# Patient Record
Sex: Female | Born: 1970 | Race: Black or African American | Hispanic: No | Smoking: Former smoker
Health system: Southern US, Community
[De-identification: ages and names within clinical notes are randomized; demographics above are authoritative.]

## PROBLEM LIST (undated history)

## (undated) DIAGNOSIS — K8000 Calculus of gallbladder with acute cholecystitis without obstruction: Secondary | ICD-10-CM

## (undated) DIAGNOSIS — I10 Essential (primary) hypertension: Secondary | ICD-10-CM

## (undated) DIAGNOSIS — N83209 Unspecified ovarian cyst, unspecified side: Secondary | ICD-10-CM

## (undated) DIAGNOSIS — F410 Panic disorder [episodic paroxysmal anxiety] without agoraphobia: Secondary | ICD-10-CM

## (undated) DIAGNOSIS — K219 Gastro-esophageal reflux disease without esophagitis: Secondary | ICD-10-CM

## (undated) DIAGNOSIS — H269 Unspecified cataract: Secondary | ICD-10-CM

## (undated) DIAGNOSIS — F419 Anxiety disorder, unspecified: Secondary | ICD-10-CM

## (undated) DIAGNOSIS — E785 Hyperlipidemia, unspecified: Secondary | ICD-10-CM

## (undated) DIAGNOSIS — K625 Hemorrhage of anus and rectum: Secondary | ICD-10-CM

## (undated) DIAGNOSIS — R569 Unspecified convulsions: Secondary | ICD-10-CM

## (undated) DIAGNOSIS — E669 Obesity, unspecified: Secondary | ICD-10-CM

## (undated) DIAGNOSIS — F319 Bipolar disorder, unspecified: Secondary | ICD-10-CM

## (undated) DIAGNOSIS — G5600 Carpal tunnel syndrome, unspecified upper limb: Secondary | ICD-10-CM

## (undated) HISTORY — DX: Hyperlipidemia, unspecified: E78.5

## (undated) HISTORY — DX: Essential (primary) hypertension: I10

## (undated) HISTORY — PX: ECTOPIC PREGNANCY SURGERY: SHX613

## (undated) HISTORY — DX: Unspecified cataract: H26.9

## (undated) HISTORY — PX: ANKLE FRACTURE SURGERY: SHX122

---

## 1992-02-28 DIAGNOSIS — K219 Gastro-esophageal reflux disease without esophagitis: Secondary | ICD-10-CM

## 1992-02-28 HISTORY — DX: Gastro-esophageal reflux disease without esophagitis: K21.9

## 2013-03-21 ENCOUNTER — Encounter (HOSPITAL_COMMUNITY): Payer: Self-pay | Admitting: *Deleted

## 2013-03-21 ENCOUNTER — Inpatient Hospital Stay (HOSPITAL_COMMUNITY)
Admission: AD | Admit: 2013-03-21 | Discharge: 2013-03-21 | Disposition: A | Discharge status: Home / Self Care | Payer: Medicaid Other | Source: Ambulatory Visit | Attending: Obstetrics and Gynecology | Admitting: Obstetrics and Gynecology

## 2013-03-21 DIAGNOSIS — R1013 Epigastric pain: Secondary | ICD-10-CM | POA: Insufficient documentation

## 2013-03-21 DIAGNOSIS — R12 Heartburn: Secondary | ICD-10-CM | POA: Insufficient documentation

## 2013-03-21 DIAGNOSIS — K219 Gastro-esophageal reflux disease without esophagitis: Secondary | ICD-10-CM

## 2013-03-21 DIAGNOSIS — Z87891 Personal history of nicotine dependence: Secondary | ICD-10-CM | POA: Insufficient documentation

## 2013-03-21 HISTORY — DX: Hemorrhage of anus and rectum: K62.5

## 2013-03-21 HISTORY — DX: Panic disorder (episodic paroxysmal anxiety): F41.0

## 2013-03-21 HISTORY — DX: Unspecified ovarian cyst, unspecified side: N83.209

## 2013-03-21 HISTORY — DX: Obesity, unspecified: E66.9

## 2013-03-21 HISTORY — DX: Unspecified convulsions: R56.9

## 2013-03-21 HISTORY — DX: Anxiety disorder, unspecified: F41.9

## 2013-03-21 HISTORY — DX: Carpal tunnel syndrome, unspecified upper limb: G56.00

## 2013-03-21 HISTORY — DX: Gastro-esophageal reflux disease without esophagitis: K21.9

## 2013-03-21 HISTORY — DX: Bipolar disorder, unspecified: F31.9

## 2013-03-21 LAB — COMPREHENSIVE METABOLIC PANEL
ALK PHOS: 84 U/L (ref 39–117)
ALT: 23 U/L (ref 0–35)
AST: 18 U/L (ref 0–37)
Albumin: 3.8 g/dL (ref 3.5–5.2)
BUN: 6 mg/dL (ref 6–23)
CO2: 24 meq/L (ref 19–32)
Calcium: 9.4 mg/dL (ref 8.4–10.5)
Chloride: 102 mEq/L (ref 96–112)
Creatinine, Ser: 0.83 mg/dL (ref 0.50–1.10)
GFR calc Af Amer: >90 mL/min (ref >90)
GFR, EST NON AFRICAN AMERICAN: 87 mL/min — AB (ref >90)
GLUCOSE: 90 mg/dL (ref 70–99)
POTASSIUM: 4 meq/L (ref 3.7–5.3)
Sodium: 139 mEq/L (ref 137–147)
TOTAL PROTEIN: 7.5 g/dL (ref 6.0–8.3)
Total Bilirubin: 0.3 mg/dL (ref 0.3–1.2)

## 2013-03-21 LAB — CBC
HEMATOCRIT: 42 % (ref 36.0–46.0)
HEMOGLOBIN: 14.3 g/dL (ref 12.0–15.0)
MCH: 31.4 pg (ref 26.0–34.0)
MCHC: 34 g/dL (ref 30.0–36.0)
MCV: 92.1 fL (ref 78.0–100.0)
Platelets: 371 10*3/uL (ref 150–400)
RBC: 4.56 MIL/uL (ref 3.87–5.11)
RDW: 13.6 % (ref 11.5–15.5)
WBC: 7.9 10*3/uL (ref 4.0–10.5)

## 2013-03-21 LAB — LIPASE, BLOOD: Lipase: 28 U/L (ref 11–59)

## 2013-03-21 LAB — AMYLASE: AMYLASE: 67 U/L (ref 0–105)

## 2013-03-21 MED ORDER — SUCRALFATE 1 GM/10ML PO SUSP: 1.0000 g | Freq: Three times a day (TID) | ORAL | Status: DC

## 2013-03-21 MED ORDER — ESOMEPRAZOLE MAGNESIUM 20 MG PO PACK: 20.0000 mg | PACK | Freq: Every day | ORAL | Status: DC

## 2013-03-21 MED ORDER — GI COCKTAIL ~~LOC~~
30.0000 mL | Freq: Once | ORAL | Status: AC
  Administered 2013-03-21: 30 mL via ORAL
  Filled 2013-03-21: qty 30

## 2013-03-21 NOTE — MAU Note (Signed)
Been having muscle spasms in abd and heartburn. At night makes her short of breath.   Has been having n/v.  Pain is getting worse.  Was on meds when lived in [place], has been off since November.

## 2013-03-21 NOTE — MAU Provider Note (Signed)
CC: Abdominal Pain and Heartburn    First Provider Initiated Contact with Patient 03/21/13 1857      HPI Kathryn Larson is a 43 y.o. [id number] presenting with heartburn and epigastric abdominal pain radiating bilaterally to costal margins and mid back. Describes the abd pain as muscle spasms. Has to sleep semi-upright to alleviate heartburn. She describes burning sensation at times causing nausea and vomiting. Has tried vinegar water, baking soda, Prilosec without relief.Intolerant of spicy, greasy foods. Symptoms typical of GERD for which she was given Nexium from [place] who [place] Denies hx of GB disease. No fever, chills, weight loss.  Needs GYN provider  Past Medical History  Diagnosis Date  . GERD (gastroesophageal reflux disease)   . Bipolar affective   . Rectal bleed   . Anxiety   . Obesity   . Panic attacks   . Seizures   . Carpal tunnel syndrome   . Ovarian cyst     OB History  Gravida Para Term Preterm AB SAB TAB Ectopic Multiple Living  6 2 2  4 4    2     # Outcome Date GA Lbr Len/2nd Weight Sex Delivery Anes PTL Lv  6 SAB           5 SAB           4 SAB           3 SAB           2 TRM           1 TRM               Past Surgical History  Procedure Laterality Date  . Ankle fracture surgery      Bilateral  . Ectopic pregnancy surgery      History   Social History  . Marital Status: Legally Separated    Spouse Name: N/A    Number of Children: N/A  . Years of Education: N/A   Occupational History  . Not on file.   Social History Main Topics  . Smoking status: Former Smoker    Quit date: 02/27/2010  . Smokeless tobacco: Not on file  . Alcohol Use: No  . Drug Use: No  . Sexual Activity: Not on file   Other Topics Concern  . Not on file   Social History Narrative  . No narrative on file    No current facility-administered medications on file prior to encounter.   No current outpatient prescriptions on file prior to encounter.     Not on File  ROS Pertinent items in HPI  PHYSICAL EXAM Filed Vitals:   03/21/13 1743  BP: 131/76  Pulse: 72  Temp: 98.3 F (36.8 C)  Resp: 20   General: Obese female in no acute distress Cardiovascular: Normal rate Respiratory: Normal effort Abdomen: Soft, obese. Mild to moderate TTP epigastrum and RUQ, LUQ. Neg Murphy's sign. No rebound. No lower abd pain Back: No CVAT Extremities: No edema Neurologic: Alert and oriented LAB RESULTS Results for orders placed during the hospital encounter of 03/21/13 (from the past 24 hour(s))  CBC     Status: None   Collection Time    03/21/13  7:30 PM      Result Value Range   WBC 7.9  4.0 - 10.5 K/uL   RBC 4.56  3.87 - 5.11 MIL/uL   Hemoglobin 14.3  12.0 - 15.0 g/dL   HCT 42.0  36.0 - 46.0 %  MCV 92.1  78.0 - 100.0 fL   MCH 31.4  26.0 - 34.0 pg   MCHC 34.0  30.0 - 36.0 g/dL   RDW 13.6  11.5 - 15.5 %   Platelets 371  150 - 400 K/uL    IMAGING No results found.  MAU COURSE GI cocktail given at [place] assumed by [name] MD at 2000. (Labs pending)  ASSESSMENT  1. GERD (gastroesophageal reflux disease)     PLAN Discharge home. See AVS for patient education.      [name], CNM 03/21/2013 7:04 PM  Pt feeling much better after GI cocktail. rx for nexium and carafate. Discussed need to have doctor to f/u with in the long term.   [name], [name], MD

## 2013-03-21 NOTE — Discharge Instructions (Signed)
Diet for Gastroesophageal Reflux Disease, Adult Reflux (acid reflux) is when acid from your stomach flows up into the esophagus. When acid comes in contact with the esophagus, the acid causes irritation and soreness (inflammation) in the esophagus. When reflux happens often or so severely that it causes damage to the esophagus, it is called gastroesophageal reflux disease (GERD). Nutrition therapy can help ease the discomfort of GERD. FOODS OR DRINKS TO AVOID OR LIMIT  Smoking or chewing tobacco. Nicotine is one of the most potent stimulants to acid production in the gastrointestinal tract.  Caffeinated and decaffeinated coffee and black tea.  Regular or low-calorie carbonated beverages or energy drinks (caffeine-free carbonated beverages are allowed).   Strong spices, such as black pepper, white pepper, red pepper, cayenne, curry powder, and chili powder.  Peppermint or spearmint.  Chocolate.  High-fat foods, including meats and fried foods. Extra added fats including oils, butter, salad dressings, and nuts. Limit these to less than 8 tsp per day.  Fruits and vegetables if they are not tolerated, such as citrus fruits or tomatoes.  Alcohol.  Any food that seems to aggravate your condition. If you have questions regarding your diet, call your caregiver or a registered dietitian. OTHER THINGS THAT MAY HELP GERD INCLUDE:   Eating your meals slowly, in a relaxed setting.  Eating 5 to 6 small meals per day instead of 3 large meals.  Eliminating food for a period of time if it causes distress.  Not lying down until 3 hours after eating a meal.  Keeping the head of your bed raised 6 to 9 inches (15 to 23 cm) by using a foam wedge or blocks under the legs of the bed. Lying flat may make symptoms worse.  Being physically active. Weight loss may be helpful in reducing reflux in overweight or obese adults.  Wear loose fitting clothing EXAMPLE MEAL PLAN This meal plan is approximately  2,000 calories based on [email or web address] meal planning guidelines. Breakfast   cup cooked oatmeal.  1 cup strawberries.  1 cup low-fat milk.  1 oz almonds. Snack  1 cup cucumber slices.  6 oz yogurt (made from low-fat or fat-free milk). Lunch  2 slice whole-wheat bread.  2 oz sliced [place].  2 tsp mayonnaise.  1 cup blueberries.  1 cup snap peas. Snack  6 whole-wheat crackers.  1 oz string cheese. Dinner   cup brown rice.  1 cup mixed veggies.  1 tsp olive oil.  3 oz grilled fish. Document Released: 02/13/2005 Document Revised: 05/08/2011 Document Reviewed: 12/30/2010 [facility] Patient Information 2014 [place], [place].

## 2013-03-23 ENCOUNTER — Encounter (HOSPITAL_COMMUNITY): Payer: Self-pay | Admitting: Emergency Medicine

## 2013-03-23 ENCOUNTER — Observation Stay (HOSPITAL_COMMUNITY)
Admission: EM | Admit: 2013-03-23 | Discharge: 2013-03-26 | Disposition: A | Discharge status: Home / Self Care | Payer: Medicaid Other | Attending: General Surgery | Admitting: General Surgery

## 2013-03-23 ENCOUNTER — Emergency Department (HOSPITAL_COMMUNITY): Payer: Medicaid Other

## 2013-03-23 DIAGNOSIS — K8010 Calculus of gallbladder with chronic cholecystitis without obstruction: Principal | ICD-10-CM | POA: Insufficient documentation

## 2013-03-23 DIAGNOSIS — E66813 Obesity, class 3: Secondary | ICD-10-CM | POA: Diagnosis present

## 2013-03-23 DIAGNOSIS — G5600 Carpal tunnel syndrome, unspecified upper limb: Secondary | ICD-10-CM | POA: Insufficient documentation

## 2013-03-23 DIAGNOSIS — F319 Bipolar disorder, unspecified: Secondary | ICD-10-CM | POA: Diagnosis present

## 2013-03-23 DIAGNOSIS — F410 Panic disorder [episodic paroxysmal anxiety] without agoraphobia: Secondary | ICD-10-CM | POA: Insufficient documentation

## 2013-03-23 DIAGNOSIS — K219 Gastro-esophageal reflux disease without esophagitis: Secondary | ICD-10-CM | POA: Diagnosis present

## 2013-03-23 DIAGNOSIS — E669 Obesity, unspecified: Secondary | ICD-10-CM | POA: Insufficient documentation

## 2013-03-23 DIAGNOSIS — F411 Generalized anxiety disorder: Secondary | ICD-10-CM | POA: Insufficient documentation

## 2013-03-23 DIAGNOSIS — F17200 Nicotine dependence, unspecified, uncomplicated: Secondary | ICD-10-CM | POA: Insufficient documentation

## 2013-03-23 DIAGNOSIS — R109 Unspecified abdominal pain: Secondary | ICD-10-CM

## 2013-03-23 DIAGNOSIS — K8000 Calculus of gallbladder with acute cholecystitis without obstruction: Secondary | ICD-10-CM | POA: Diagnosis present

## 2013-03-23 DIAGNOSIS — K810 Acute cholecystitis: Secondary | ICD-10-CM

## 2013-03-23 HISTORY — DX: Morbid (severe) obesity due to excess calories: E66.01

## 2013-03-23 HISTORY — DX: Bipolar disorder, unspecified: F31.9

## 2013-03-23 HISTORY — DX: Calculus of gallbladder with acute cholecystitis without obstruction: K80.00

## 2013-03-23 LAB — URINALYSIS, ROUTINE W REFLEX MICROSCOPIC
BILIRUBIN URINE: NEGATIVE
Glucose, UA: NEGATIVE mg/dL
Ketones, ur: NEGATIVE mg/dL
Leukocytes, UA: NEGATIVE
Nitrite: NEGATIVE
PH: 7 (ref 5.0–8.0)
Protein, ur: NEGATIVE mg/dL
SPECIFIC GRAVITY, URINE: 1.019 (ref 1.005–1.030)
UROBILINOGEN UA: 0.2 mg/dL (ref 0.0–1.0)

## 2013-03-23 LAB — COMPREHENSIVE METABOLIC PANEL
ALBUMIN: 3.6 g/dL (ref 3.5–5.2)
ALK PHOS: 88 U/L (ref 39–117)
ALT: 19 U/L (ref 0–35)
AST: 18 U/L (ref 0–37)
BUN: 7 mg/dL (ref 6–23)
CALCIUM: 8.7 mg/dL (ref 8.4–10.5)
CO2: 22 mEq/L (ref 19–32)
Chloride: 104 mEq/L (ref 96–112)
Creatinine, Ser: 0.83 mg/dL (ref 0.50–1.10)
GFR calc non Af Amer: 87 mL/min — ABNORMAL LOW (ref >90)
GLUCOSE: 105 mg/dL — AB (ref 70–99)
Potassium: 3.4 mEq/L — ABNORMAL LOW (ref 3.7–5.3)
SODIUM: 139 meq/L (ref 137–147)
TOTAL PROTEIN: 7.2 g/dL (ref 6.0–8.3)
Total Bilirubin: 0.4 mg/dL (ref 0.3–1.2)

## 2013-03-23 LAB — URINE MICROSCOPIC-ADD ON

## 2013-03-23 LAB — CBC WITH DIFFERENTIAL/PLATELET
Basophils Absolute: 0 10*3/uL (ref 0.0–0.1)
Basophils Relative: 0 % (ref 0–1)
EOS ABS: 0.4 10*3/uL (ref 0.0–0.7)
Eosinophils Relative: 7 % — ABNORMAL HIGH (ref 0–5)
HCT: 40 % (ref 36.0–46.0)
Hemoglobin: 13.9 g/dL (ref 12.0–15.0)
LYMPHS ABS: 2 10*3/uL (ref 0.7–4.0)
Lymphocytes Relative: 31 % (ref 12–46)
MCH: 31.9 pg (ref 26.0–34.0)
MCHC: 34.8 g/dL (ref 30.0–36.0)
MCV: 91.7 fL (ref 78.0–100.0)
Monocytes Absolute: 0.7 10*3/uL (ref 0.1–1.0)
Monocytes Relative: 10 % (ref 3–12)
Neutro Abs: 3.4 10*3/uL (ref 1.7–7.7)
Neutrophils Relative %: 52 % (ref 43–77)
PLATELETS: 358 10*3/uL (ref 150–400)
RBC: 4.36 MIL/uL (ref 3.87–5.11)
RDW: 12.9 % (ref 11.5–15.5)
WBC: 6.5 10*3/uL (ref 4.0–10.5)

## 2013-03-23 LAB — LIPASE, BLOOD: Lipase: 35 U/L (ref 11–59)

## 2013-03-23 LAB — POCT PREGNANCY, URINE: PREG TEST UR: NEGATIVE

## 2013-03-23 MED ORDER — MORPHINE SULFATE 4 MG/ML IJ SOLN
6.0000 mg | Freq: Once | INTRAMUSCULAR | Status: AC
  Administered 2013-03-23: 6 mg via INTRAVENOUS
  Filled 2013-03-23: qty 2

## 2013-03-23 MED ORDER — PANTOPRAZOLE SODIUM 40 MG IV SOLR
40.0000 mg | Freq: Every day | INTRAVENOUS | Status: DC
  Administered 2013-03-23: 40 mg via INTRAVENOUS
  Filled 2013-03-23 (×2): qty 40

## 2013-03-23 MED ORDER — HYDROMORPHONE HCL PF 1 MG/ML IJ SOLN
1.0000 mg | Freq: Once | INTRAMUSCULAR | Status: AC
  Administered 2013-03-23: 1 mg via INTRAVENOUS
  Filled 2013-03-23: qty 1

## 2013-03-23 MED ORDER — FAMOTIDINE IN NACL 20-0.9 MG/50ML-% IV SOLN
20.0000 mg | Freq: Once | INTRAVENOUS | Status: AC
  Administered 2013-03-23: 20 mg via INTRAVENOUS
  Filled 2013-03-23: qty 50

## 2013-03-23 MED ORDER — PIPERACILLIN-TAZOBACTAM 3.375 G IVPB
3.3750 g | Freq: Three times a day (TID) | INTRAVENOUS | Status: DC
  Administered 2013-03-23 – 2013-03-24 (×3): 3.375 g via INTRAVENOUS
  Filled 2013-03-23 (×5): qty 50

## 2013-03-23 MED ORDER — ONDANSETRON HCL 4 MG/2ML IJ SOLN
4.0000 mg | Freq: Once | INTRAMUSCULAR | Status: AC
  Administered 2013-03-23: 4 mg via INTRAVENOUS
  Filled 2013-03-23: qty 2

## 2013-03-23 MED ORDER — DEXTROSE-NACL 5-0.9 % IV SOLN
INTRAVENOUS | Status: DC
  Administered 2013-03-23: 22:00:00 via INTRAVENOUS
  Administered 2013-03-24: 125 mL/h via INTRAVENOUS
  Administered 2013-03-24: 16:00:00 via INTRAVENOUS

## 2013-03-23 MED ORDER — IOHEXOL 300 MG/ML  SOLN
100.0000 mL | Freq: Once | INTRAMUSCULAR | Status: AC | PRN
  Administered 2013-03-23: 100 mL via INTRAVENOUS

## 2013-03-23 MED ORDER — HYDROMORPHONE HCL PF 1 MG/ML IJ SOLN
1.0000 mg | INTRAMUSCULAR | Status: DC | PRN
  Administered 2013-03-23 – 2013-03-24 (×8): 1 mg via INTRAVENOUS
  Filled 2013-03-23 (×9): qty 1

## 2013-03-23 MED ORDER — ONDANSETRON HCL 4 MG/2ML IJ SOLN
4.0000 mg | Freq: Four times a day (QID) | INTRAMUSCULAR | Status: DC | PRN
  Administered 2013-03-23 – 2013-03-24 (×3): 4 mg via INTRAVENOUS
  Filled 2013-03-23 (×3): qty 2

## 2013-03-23 MED ORDER — HYDROMORPHONE HCL PF 1 MG/ML IJ SOLN
1.0000 mg | INTRAMUSCULAR | Status: DC | PRN
  Administered 2013-03-23 – 2013-03-26 (×4): 1 mg via INTRAVENOUS
  Filled 2013-03-23 (×3): qty 1

## 2013-03-23 MED ORDER — SERTRALINE HCL 100 MG PO TABS
100.0000 mg | ORAL_TABLET | Freq: Three times a day (TID) | ORAL | Status: DC
  Administered 2013-03-23 – 2013-03-26 (×6): 100 mg via ORAL
  Filled 2013-03-23 (×11): qty 1

## 2013-03-23 MED ORDER — IOHEXOL 300 MG/ML  SOLN
25.0000 mL | Freq: Once | INTRAMUSCULAR | Status: AC | PRN
  Administered 2013-03-23: 25 mL via ORAL

## 2013-03-23 MED ORDER — SUCRALFATE 1 GM/10ML PO SUSP
1.0000 g | Freq: Three times a day (TID) | ORAL | Status: DC
  Administered 2013-03-23 – 2013-03-26 (×7): 1 g via ORAL
  Filled 2013-03-23 (×17): qty 10

## 2013-03-23 NOTE — ED Notes (Signed)
PA at bedside.

## 2013-03-23 NOTE — ED Provider Notes (Signed)
One week intermittent right upper quadrant and epigastric abdominal pain lasting several hours at a time current episode is an approximately 24 hours with multiple spells of nausea and vomiting with moderate right upper quadrant tenderness without rebound with labs unremarkable however ultrasound with cholelithiasis with dilated common bile duct and surgery paged for evaluation.  Medical screening examination/treatment/procedure(s) were conducted as a shared visit with non-physician practitioner(s) and myself.  I personally evaluated the patient during the encounter.  EKG Interpretation    Date/Time:    Ventricular Rate:    PR Interval:    QRS Duration:   QT Interval:    QTC Calculation:   R Axis:     Text Interpretation:               Babette Relic, MD 03/23/13 2041

## 2013-03-23 NOTE — ED Notes (Signed)
Per Dr. Canary Brim, patient to no longer receive HIDA scan, patient informed, pain medication order recieved

## 2013-03-23 NOTE — ED Notes (Signed)
Pt returns from ultrasound

## 2013-03-23 NOTE — ED Notes (Signed)
Per EMS pt has hx of acid reflux and ulcers. Pt c/o stabbing pain which she states feels the same as her acid reflux pain. Denies SOB  Or LOC. Pt states she has some n/v. Pain is 10/10 Pt is ambulatory.

## 2013-03-23 NOTE — ED Provider Notes (Signed)
CSN: [id number]     Arrival date & time 03/23/13  6945 History   First MD Initiated Contact with Patient 03/23/13 [phone]     Chief Complaint  Patient presents with  . Abdominal Pain   (Consider location/radiation/quality/duration/timing/severity/associated sxs/prior Treatment) HPI Comments: Patient with h/o GERD, tubal pregnancy, no other abdominal surgeries -- presents with sharp upper abdominal pain for the past one week with associated vomiting. Patient was seen at MAU on 1/23 and prescribed Carafate which has not been helping. Patient feels that this pain is different than her typical GERD pain. Normally patient will have burning sensation in epigastrium and mid chest relieved with OTC meds. This new pain radiates to the back and shoulders. It is not associated with fever, diarrhea, blood in stool, urinary changes. Patient is currently on her menstrual period which has been normal for her. The onset of this condition was acute. The course is waxing and waning. Aggravating factors: none. Alleviating factors: none.    Patient is a 43 y.o. female presenting with abdominal pain. The history is provided by the patient.  Abdominal Pain Associated symptoms: nausea and vomiting   Associated symptoms: no chest pain, no cough, no diarrhea, no dysuria, no fever and no sore throat     Past Medical History  Diagnosis Date  . GERD (gastroesophageal reflux disease)   . Bipolar affective   . Rectal bleed   . Anxiety   . Obesity   . Panic attacks   . Seizures   . Carpal tunnel syndrome   . Ovarian cyst    Past Surgical History  Procedure Laterality Date  . Ankle fracture surgery      Bilateral  . Ectopic pregnancy surgery     No family history on file. History  Substance Use Topics  . Smoking status: Former Smoker    Quit date: 02/27/2010  . Smokeless tobacco: Not on file  . Alcohol Use: No   OB History   Grav Para Term Preterm Abortions TAB SAB Ect Mult Living   6 2 2  4  4   2       Review of Systems  Constitutional: Negative for fever.  HENT: Negative for rhinorrhea and sore throat.   Eyes: Negative for redness.  Respiratory: Negative for cough.   Cardiovascular: Negative for chest pain.  Gastrointestinal: Positive for nausea, vomiting and abdominal pain. Negative for diarrhea.  Genitourinary: Negative for dysuria.  Musculoskeletal: Negative for myalgias.  Skin: Negative for rash.  Neurological: Negative for headaches.    Allergies  Review of patient's allergies indicates no known allergies.  Home Medications   Current Outpatient Rx  Name  Route  Sig  Dispense  Refill  . esomeprazole (NEXIUM) 20 MG packet   Oral   Take 20 mg by mouth daily before breakfast.   30 each   3   . sertraline (ZOLOFT) 100 MG tablet   Oral   Take 100 mg by mouth 3 (three) times daily.         . sucralfate (CARAFATE) 1 GM/10ML suspension   Oral   Take 10 mLs (1 g total) by mouth 4 (four) times daily -  with meals and at bedtime.   420 mL   0    BP 111/84  Pulse 50  Temp(Src) 97.7 F (36.5 C) (Oral)  Resp 18  SpO2 98%  LMP 03/17/2013 Physical Exam  Nursing note and vitals reviewed. Constitutional: She appears well-developed and well-nourished.  HENT:  Head: Normocephalic and atraumatic.  Eyes: Conjunctivae are normal. Right eye exhibits no discharge. Left eye exhibits no discharge.  Neck: Normal range of motion. Neck supple.  Cardiovascular: Normal rate, regular rhythm and normal heart sounds.   Pulmonary/Chest: Effort normal and breath sounds normal.  Abdominal: Soft. She exhibits no distension. There is tenderness (moderate) in the right upper quadrant, epigastric area and left upper quadrant. There is no rebound and no guarding.  Neurological: She is alert.  Skin: Skin is warm and dry.  Psychiatric: She has a normal mood and affect.    ED Course  Procedures (including critical care time) Labs Review Labs Reviewed  CBC WITH DIFFERENTIAL - Abnormal;  Notable for the following:    Eosinophils Relative 7 (*)    All other components within normal limits  COMPREHENSIVE METABOLIC PANEL - Abnormal; Notable for the following:    Potassium 3.4 (*)    Glucose, Bld 105 (*)    GFR calc non Af Amer 87 (*)    All other components within normal limits  URINALYSIS, ROUTINE W REFLEX MICROSCOPIC - Abnormal; Notable for the following:    APPearance CLOUDY (*)    Hgb urine dipstick MODERATE (*)    All other components within normal limits  URINE MICROSCOPIC-ADD ON - Abnormal; Notable for the following:    Squamous Epithelial / LPF FEW (*)    All other components within normal limits  LIPASE, BLOOD  POCT PREGNANCY, URINE   Imaging Review US Abdomen Complete  03/23/2013   CLINICAL DATA:  Epigastric pain  EXAM: ULTRASOUND ABDOMEN COMPLETE  COMPARISON:  None.  FINDINGS: Gallbladder:  Multiple gallstones are present. The largest is 2.2 cm. No wall thickening or Murphy's sign. There is debris and sludge within the lumen of the gallbladder.  Common bile duct:  Diameter: The common bile duct is dilated at 9 mm.  Liver:  No focal lesion identified. Within normal limits in parenchymal echogenicity.  IVC:  No abnormality visualized.  Pancreas:  No mass.  Pancreatic duct is upper normal in caliber at 3.6 mm.  Spleen:  Size and appearance within normal limits.  Right Kidney:  Length: 11.2 cm in length. Normal cortical echogenicity. No hydronephrosis or mass.  Left Kidney:  Length: 11.4 cm in length. No hydronephrosis. Normal cortical echogenicity. 8 mm hyperechoic lesion in the lower pole cortex.  Abdominal aorta:  Maximal diameter is 2.6 cm.  Other findings:  None.  IMPRESSION: Cholelithiasis.  Common bile duct is dilated.  Biliary obstruction is not excluded.  8 mm hyperechoic lesion in the left kidney. MR with without contrast is recommended.  Maximal aortic diameter is 2.6 cm. Ectatic abdominal aorta at risk for aneurysm development. Recommend follow up by [place] in 5  years. This recommendation follows ACR consensus guidelines: White Paper of the ACR Incidental Findings Committee II on Vascular Findings. J Am Coll Radiol 2013; 10:789-794.   Electronically Signed   By: Maryclare Bean M.D.   On: 03/23/2013 09:25    EKG Interpretation   None      7:03 AM Patient seen and examined. Work-up initiated. Medications ordered. Will eval GB with [place].   Vital signs reviewed and are as follows: Filed Vitals:   03/23/13 0622  BP: 111/84  Pulse: 50  Temp: 97.7 F (36.5 C)  Resp: 18   Patient discussed with and seen by Dr. Stevie Kern. Surgery consulted.   12:24 PM Surgery is ordering HIDA scan to eval GB vs other etiology.   3:48 PM I have spoken with Dr.  Rosendo Gros who has recently seen patient. HIDA ordered for 7pm but Dr. Rosendo Gros would like to proceed with CT abd/pelvis. Dispo will be made in conjunction with surgery.   Handoff to [facility] and Dr. Canary Brim at shift change. Dr. Stevie Kern aware of plan.   MDM   1. Abdominal pain    Pending completion of work-up and dispo plan.     Carlisle Cater, PA-C 03/23/13 1550

## 2013-03-23 NOTE — ED Notes (Signed)
Patient transported to US 

## 2013-03-23 NOTE — ED Provider Notes (Signed)
Patient received from [place], [place].   43 yo female with 1 week hx of upper abdominal pain with vomiting. PMH significnat for GERD, Tubal pregnancy. Patient started on carafate on 1/23 but symptoms are persistent. US shows Cholelithiasis with dilated CBD, suspicious for biliary obstruction. Patient discussed with Dr. Rosendo Gros by Alecia Lemming, PA-C. Plan to get CT abd/pelvis. Disposition will be made in conjunction with general surgery, pending CT results.   CT scan shows large gallstone lodged at neck of the gallbladder.  Surgery plans to admit the patient at this time for laparoscopic cholecystectomy and IV antibiotics.       Sherrie George, PA-C 03/25/13 2302

## 2013-03-23 NOTE — ED Notes (Signed)
transfter to 6n report call to francis rn

## 2013-03-23 NOTE — Consult Note (Signed)
Kathryn Larson Nov 06, 1970  [id number].    Requesting MD: Kathryn Larson Chief Complaint/Reason for Consult: RUQ  HPI:  43 y/o AA female with h/o GERD presents to [facility] with complaints of severe abdominal pain since Sunday night.  She thought it was her GERD which she has had since 1994 when pregnant with her son.  However, this time it did not improve with, Prilosec. She went to primary care on Friday who thought it was her GERD and have her carafate and instructed on taking Prilosec.  She thinks it feels different than her normal GERD heartburn.  C/o RUQ/LUQ and epigastric abdominal pain, anorexia, nausea, and bilious vomiting.  The pain radiates to her back and into her chest.  She didn't try any tums etc, but did take a BC powder on Monday which she promptly threw up.  She denies any post-prandial pain/bloating/N/V prior to Sunday, but notes here GERD has been worsening over the last month.  She denies any excessive NSAID use, alcohol, and smokes every other day.  Admits to some minimal hematemesis from voming so much.  The patient then confides in me that she has had many thoughts of harming herself even comiting suicide secondary to the pain which she has been experiencing saying "I'm already in so much pain it wouldn't matter if I was still here or not".  She says she has tried in the past and been admitted to the hospital.  Workup shows normal labs including LFT's and WBC, afebrile, with gallstones on ultrasound with sludge without evidence of cholecystitis.  Dilated CBD at 67m.  Also has left kidney lesion and enlarged aortic diameter at 2.6.  ROS: All systems reviewed and otherwise negative except for as above  No family history on file.  Past Medical History  Diagnosis Date  . GERD (gastroesophageal reflux disease)   . Bipolar affective   . Rectal bleed   . Anxiety   . Obesity   . Panic attacks   . Seizures   . Carpal tunnel syndrome   . Ovarian cyst     Past Surgical History   Procedure Laterality Date  . Ankle fracture surgery      Bilateral  . Ectopic pregnancy surgery      Social History:  reports that she quit smoking about 3 years ago. She does not have any smokeless tobacco history on file. She reports that she does not drink alcohol or use illicit drugs.  Allergies: No Known Allergies   (Not in a hospital admission)  Blood pressure 130/76, pulse 53, temperature 98.5 F (36.9 C), temperature source Oral, resp. rate 22, last menstrual period 03/17/2013, SpO2 96.00%. Physical Exam: General: pleasant, WD/WN obese AA female who is laying in bed in NAD HEENT: head is normocephalic, atraumatic.  Sclera are noninjected.  PERRL.  Ears and nose without any masses or lesions.  Mouth is pink and moist Heart: regular, rate, and rhythm.  No obvious murmurs, gallops, or rubs noted.  Palpable pedal pulses bilaterally Lungs: CTAB, no wheezes, rhonchi, or rales noted.  Respiratory effort nonlabored Abd: obese, soft, moderate RUQ/LUQ/epigastrium pain, +BS, no masses, hernias, or organomegaly, horizontal incision in the suprapubic area well healed. MS: all 4 extremities are symmetrical with no cyanosis, clubbing, or edema. Skin: warm and dry with no masses, lesions, or rashes Psych: A&Ox3 with noted anxiety, expresses suicidal ideations, but no current intent to harm herself while in the ER.  +hopelessness surrounding her pain/discomfort.    Results for orders placed during the hospital  encounter of 03/23/13 (from the past 48 hour(s))  CBC WITH DIFFERENTIAL     Status: Abnormal   Collection Time    03/23/13  6:48 AM      Result Value Range   WBC 6.5  4.0 - 10.5 K/uL   RBC 4.36  3.87 - 5.11 MIL/uL   Hemoglobin 13.9  12.0 - 15.0 g/dL   HCT 40.0  36.0 - 46.0 %   MCV 91.7  78.0 - 100.0 fL   MCH 31.9  26.0 - 34.0 pg   MCHC 34.8  30.0 - 36.0 g/dL   RDW 12.9  11.5 - 15.5 %   Platelets 358  150 - 400 K/uL   Neutrophils Relative % 52  43 - 77 %   Neutro Abs 3.4  1.7 -  7.7 K/uL   Lymphocytes Relative 31  12 - 46 %   Lymphs Abs 2.0  0.7 - 4.0 K/uL   Monocytes Relative 10  3 - 12 %   Monocytes Absolute 0.7  0.1 - 1.0 K/uL   Eosinophils Relative 7 (*) 0 - 5 %   Eosinophils Absolute 0.4  0.0 - 0.7 K/uL   Basophils Relative 0  0 - 1 %   Basophils Absolute 0.0  0.0 - 0.1 K/uL  COMPREHENSIVE METABOLIC PANEL     Status: Abnormal   Collection Time    03/23/13  6:48 AM      Result Value Range   Sodium 139  137 - 147 mEq/L   Potassium 3.4 (*) 3.7 - 5.3 mEq/L   Chloride 104  96 - 112 mEq/L   CO2 22  19 - 32 mEq/L   Glucose, Bld 105 (*) 70 - 99 mg/dL   BUN 7  6 - 23 mg/dL   Creatinine, Ser 0.83  0.50 - 1.10 mg/dL   Calcium 8.7  8.4 - 10.5 mg/dL   Total Protein 7.2  6.0 - 8.3 g/dL   Albumin 3.6  3.5 - 5.2 g/dL   AST 18  0 - 37 U/L   ALT 19  0 - 35 U/L   Alkaline Phosphatase 88  39 - 117 U/L   Total Bilirubin 0.4  0.3 - 1.2 mg/dL   GFR calc non Af Amer 87 (*) >90 mL/min   GFR calc Af Amer >90  >90 mL/min   Comment: (NOTE)     The eGFR has been calculated using the CKD EPI equation.     This calculation has not been validated in all clinical situations.     eGFR's persistently <90 mL/min signify possible Chronic Kidney     Disease.  LIPASE, BLOOD     Status: None   Collection Time    03/23/13  6:48 AM      Result Value Range   Lipase 35  11 - 59 U/L  URINALYSIS, ROUTINE W REFLEX MICROSCOPIC     Status: Abnormal   Collection Time    03/23/13  7:23 AM      Result Value Range   Color, Urine YELLOW  YELLOW   APPearance CLOUDY (*) CLEAR   Specific Gravity, Urine 1.019  1.005 - 1.030   pH 7.0  5.0 - 8.0   Glucose, UA NEGATIVE  NEGATIVE mg/dL   Hgb urine dipstick MODERATE (*) NEGATIVE   Bilirubin Urine NEGATIVE  NEGATIVE   Ketones, ur NEGATIVE  NEGATIVE mg/dL   Protein, ur NEGATIVE  NEGATIVE mg/dL   Urobilinogen, UA 0.2  0.0 - 1.0 mg/dL  Nitrite NEGATIVE  NEGATIVE   Leukocytes, UA NEGATIVE  NEGATIVE  URINE MICROSCOPIC-ADD ON     Status: Abnormal    Collection Time    03/23/13  7:23 AM      Result Value Range   Squamous Epithelial / LPF FEW (*) RARE   RBC / HPF 7-10  <3 RBC/hpf  POCT PREGNANCY, URINE     Status: None   Collection Time    03/23/13  7:31 AM      Result Value Range   Preg Test, Ur NEGATIVE  NEGATIVE   Comment:            THE SENSITIVITY OF THIS     METHODOLOGY IS >24 mIU/mL   US Abdomen Complete  03/23/2013   CLINICAL DATA:  Epigastric pain  EXAM: ULTRASOUND ABDOMEN COMPLETE  COMPARISON:  None.  FINDINGS: Gallbladder:  Multiple gallstones are present. The largest is 2.2 cm. No wall thickening or Murphy's sign. There is debris and sludge within the lumen of the gallbladder.  Common bile duct:  Diameter: The common bile duct is dilated at 9 mm.  Liver:  No focal lesion identified. Within normal limits in parenchymal echogenicity.  IVC:  No abnormality visualized.  Pancreas:  No mass.  Pancreatic duct is upper normal in caliber at 3.6 mm.  Spleen:  Size and appearance within normal limits.  Right Kidney:  Length: 11.2 cm in length. Normal cortical echogenicity. No hydronephrosis or mass.  Left Kidney:  Length: 11.4 cm in length. No hydronephrosis. Normal cortical echogenicity. 8 mm hyperechoic lesion in the lower pole cortex.  Abdominal aorta:  Maximal diameter is 2.6 cm.  Other findings:  None.  IMPRESSION: Cholelithiasis.  Common bile duct is dilated.  Biliary obstruction is not excluded.  8 mm hyperechoic lesion in the left kidney. MR with without contrast is recommended.  Maximal aortic diameter is 2.6 cm. Ectatic abdominal aorta at risk for aneurysm development. Recommend follow up by [place] in 5 years. This recommendation follows ACR consensus guidelines: White Paper of the ACR Incidental Findings Committee II on Vascular Findings. J Am Coll Radiol 2013; 10:789-794.   Electronically Signed   By: Maryclare Bean M.D.   On: 03/23/2013 09:25       Assessment/Plan Cholelithiasis without evidence of cholecystitis RUQ/LUQ/Epigastric  abdominal pain GERD - more symptomatic recently Bipolar/Anxiety H/o Seizure D.O.  Plan: 1.  Basal CT scan it seems the patient has a large gallstone lodged neck of the gallbladder. 2. We will admit the patient at this time to proceed with a laparoscopic cholecystectomy. 3. I will keep the patient n.p.o., IV fluids, and placed on antibiotics.   Coralie Keens 03/23/2013, 11:20 AM Pager: [phone]

## 2013-03-24 ENCOUNTER — Encounter (HOSPITAL_COMMUNITY)
Admission: EM | Disposition: A | Discharge status: Home / Self Care | Payer: Self-pay | Source: Home / Self Care | Attending: Emergency Medicine

## 2013-03-24 ENCOUNTER — Observation Stay (HOSPITAL_COMMUNITY): Payer: Medicaid Other | Admitting: Anesthesiology

## 2013-03-24 ENCOUNTER — Encounter (HOSPITAL_COMMUNITY): Payer: Medicaid Other | Admitting: Anesthesiology

## 2013-03-24 ENCOUNTER — Encounter (HOSPITAL_COMMUNITY): Payer: Self-pay | Admitting: Anesthesiology

## 2013-03-24 DIAGNOSIS — K8010 Calculus of gallbladder with chronic cholecystitis without obstruction: Secondary | ICD-10-CM

## 2013-03-24 HISTORY — PX: CHOLECYSTECTOMY: SHX55

## 2013-03-24 LAB — SURGICAL PCR SCREEN
MRSA, PCR: NEGATIVE
STAPHYLOCOCCUS AUREUS: NEGATIVE

## 2013-03-24 SURGERY — LAPAROSCOPIC CHOLECYSTECTOMY
Anesthesia: General | Site: Abdomen

## 2013-03-24 MED ORDER — NEOSTIGMINE METHYLSULFATE 1 MG/ML IJ SOLN
INTRAMUSCULAR | Status: DC | PRN
  Administered 2013-03-24: 5 mg via INTRAVENOUS

## 2013-03-24 MED ORDER — GLYCOPYRROLATE 0.2 MG/ML IJ SOLN
INTRAMUSCULAR | Status: AC
  Filled 2013-03-24: qty 2

## 2013-03-24 MED ORDER — OXYCODONE-ACETAMINOPHEN 5-325 MG PO TABS
1.0000 | ORAL_TABLET | ORAL | Status: DC | PRN
  Administered 2013-03-24 – 2013-03-26 (×5): 2 via ORAL
  Filled 2013-03-24 (×5): qty 2

## 2013-03-24 MED ORDER — LIDOCAINE HCL (CARDIAC) 20 MG/ML IV SOLN
INTRAVENOUS | Status: AC
  Filled 2013-03-24: qty 5

## 2013-03-24 MED ORDER — HEMOSTATIC AGENTS (NO CHARGE) OPTIME
TOPICAL | Status: DC | PRN
  Administered 2013-03-24: 2 via TOPICAL

## 2013-03-24 MED ORDER — ROCURONIUM BROMIDE 50 MG/5ML IV SOLN
INTRAVENOUS | Status: AC
  Filled 2013-03-24: qty 1

## 2013-03-24 MED ORDER — BUPIVACAINE-EPINEPHRINE 0.25% -1:200000 IJ SOLN
INTRAMUSCULAR | Status: DC | PRN
  Administered 2013-03-24: 20 mL

## 2013-03-24 MED ORDER — HYDROMORPHONE HCL PF 1 MG/ML IJ SOLN: 0.2500 mg | INTRAMUSCULAR | Status: DC | PRN

## 2013-03-24 MED ORDER — GLYCOPYRROLATE 0.2 MG/ML IJ SOLN
INTRAMUSCULAR | Status: DC | PRN
  Administered 2013-03-24: 0.6 mg via INTRAVENOUS

## 2013-03-24 MED ORDER — OXYCODONE HCL 5 MG/5ML PO SOLN: 5.0000 mg | Freq: Once | ORAL | Status: AC | PRN

## 2013-03-24 MED ORDER — PROPOFOL 10 MG/ML IV BOLUS
INTRAVENOUS | Status: AC
  Filled 2013-03-24: qty 20

## 2013-03-24 MED ORDER — FENTANYL CITRATE 0.05 MG/ML IJ SOLN
INTRAMUSCULAR | Status: AC
  Filled 2013-03-24: qty 5

## 2013-03-24 MED ORDER — SUCCINYLCHOLINE CHLORIDE 20 MG/ML IJ SOLN
INTRAMUSCULAR | Status: DC | PRN
  Administered 2013-03-24: 120 mg via INTRAVENOUS

## 2013-03-24 MED ORDER — ROCURONIUM BROMIDE 100 MG/10ML IV SOLN
INTRAVENOUS | Status: DC | PRN
  Administered 2013-03-24: 40 mg via INTRAVENOUS

## 2013-03-24 MED ORDER — MIDAZOLAM HCL 2 MG/2ML IJ SOLN
INTRAMUSCULAR | Status: AC
  Filled 2013-03-24: qty 2

## 2013-03-24 MED ORDER — LIDOCAINE HCL (CARDIAC) 20 MG/ML IV SOLN
INTRAVENOUS | Status: DC | PRN
  Administered 2013-03-24: 50 mg via INTRAVENOUS

## 2013-03-24 MED ORDER — SODIUM CHLORIDE 0.9 % IR SOLN
Status: DC | PRN
  Administered 2013-03-24: 2000 mL

## 2013-03-24 MED ORDER — ONDANSETRON HCL 4 MG/2ML IJ SOLN
INTRAMUSCULAR | Status: AC
  Filled 2013-03-24: qty 2

## 2013-03-24 MED ORDER — ONDANSETRON HCL 4 MG/2ML IJ SOLN: 4.0000 mg | Freq: Once | INTRAMUSCULAR | Status: AC | PRN

## 2013-03-24 MED ORDER — LACTATED RINGERS IV SOLN
INTRAVENOUS | Status: DC
  Administered 2013-03-24 (×3): via INTRAVENOUS

## 2013-03-24 MED ORDER — MEPERIDINE HCL 25 MG/ML IJ SOLN: 6.2500 mg | INTRAMUSCULAR | Status: DC | PRN

## 2013-03-24 MED ORDER — PROPOFOL 10 MG/ML IV BOLUS
INTRAVENOUS | Status: DC | PRN
  Administered 2013-03-24: 140 mg via INTRAVENOUS

## 2013-03-24 MED ORDER — EPHEDRINE SULFATE 50 MG/ML IJ SOLN
INTRAMUSCULAR | Status: DC | PRN
  Administered 2013-03-24 (×2): 5 mg via INTRAVENOUS

## 2013-03-24 MED ORDER — BUPIVACAINE-EPINEPHRINE (PF) 0.25% -1:200000 IJ SOLN
INTRAMUSCULAR | Status: AC
  Filled 2013-03-24: qty 30

## 2013-03-24 MED ORDER — ONDANSETRON HCL 4 MG/2ML IJ SOLN
INTRAMUSCULAR | Status: DC | PRN
  Administered 2013-03-24: 4 mg via INTRAVENOUS

## 2013-03-24 MED ORDER — FENTANYL CITRATE 0.05 MG/ML IJ SOLN
INTRAMUSCULAR | Status: DC | PRN
  Administered 2013-03-24 (×5): 50 ug via INTRAVENOUS

## 2013-03-24 MED ORDER — GLYCOPYRROLATE 0.2 MG/ML IJ SOLN
INTRAMUSCULAR | Status: AC
  Filled 2013-03-24: qty 1

## 2013-03-24 MED ORDER — OXYCODONE HCL 5 MG PO TABS: 5.0000 mg | ORAL_TABLET | Freq: Once | ORAL | Status: AC | PRN

## 2013-03-24 MED ORDER — MIDAZOLAM HCL 5 MG/5ML IJ SOLN
INTRAMUSCULAR | Status: DC | PRN
  Administered 2013-03-24: 2 mg via INTRAVENOUS

## 2013-03-24 MED ORDER — HYDROMORPHONE HCL PF 1 MG/ML IJ SOLN
INTRAMUSCULAR | Status: AC
  Filled 2013-03-24: qty 1

## 2013-03-24 MED ORDER — SUCCINYLCHOLINE CHLORIDE 20 MG/ML IJ SOLN
INTRAMUSCULAR | Status: AC
  Filled 2013-03-24: qty 1

## 2013-03-24 MED ORDER — DEXTROSE-NACL 5-0.9 % IV SOLN
INTRAVENOUS | Status: DC
  Administered 2013-03-24: 19:00:00 via INTRAVENOUS

## 2013-03-24 SURGICAL SUPPLY — 38 items
APPLIER CLIP 5 13 M/L LIGAMAX5 (MISCELLANEOUS) ×3
APPLIER CLIP ROT 10 11.4 M/L (STAPLE)
BLADE SURG ROTATE 9660 (MISCELLANEOUS) IMPLANT
CANISTER SUCTION 2500CC (MISCELLANEOUS) ×3 IMPLANT
CHLORAPREP W/TINT 26ML (MISCELLANEOUS) ×3 IMPLANT
CLIP APPLIE 5 13 M/L LIGAMAX5 (MISCELLANEOUS) ×2 IMPLANT
CLIP APPLIE ROT 10 11.4 M/L (STAPLE) IMPLANT
CLOSURE STERI-STRIP 1/4X4 (GAUZE/BANDAGES/DRESSINGS) ×3 IMPLANT
COVER MAYO STAND STRL (DRAPES) ×3 IMPLANT
COVER SURGICAL LIGHT HANDLE (MISCELLANEOUS) ×3 IMPLANT
DERMABOND ADVANCED (GAUZE/BANDAGES/DRESSINGS) ×1
DERMABOND ADVANCED .7 DNX12 (GAUZE/BANDAGES/DRESSINGS) ×2 IMPLANT
DRAPE C-ARM 42X72 X-RAY (DRAPES) ×3 IMPLANT
DRAPE UTILITY 15X26 W/TAPE STR (DRAPE) ×6 IMPLANT
DRSG TEGADERM 2-3/8X2-3/4 SM (GAUZE/BANDAGES/DRESSINGS) ×3 IMPLANT
ELECT REM PT RETURN 9FT ADLT (ELECTROSURGICAL) ×3
ELECTRODE REM PT RTRN 9FT ADLT (ELECTROSURGICAL) ×2 IMPLANT
GLOVE BIOGEL PI IND STRL 8 (GLOVE) ×2 IMPLANT
GLOVE BIOGEL PI INDICATOR 8 (GLOVE) ×1
GLOVE ECLIPSE 7.5 STRL STRAW (GLOVE) ×3 IMPLANT
GOWN STRL NON-REIN LRG LVL3 (GOWN DISPOSABLE) ×9 IMPLANT
KIT BASIN OR (CUSTOM PROCEDURE TRAY) ×3 IMPLANT
KIT ROOM TURNOVER OR (KITS) ×3 IMPLANT
NS IRRIG 1000ML POUR BTL (IV SOLUTION) ×3 IMPLANT
PAD ARMBOARD 7.5X6 YLW CONV (MISCELLANEOUS) ×6 IMPLANT
POUCH SPECIMEN RETRIEVAL 10MM (ENDOMECHANICALS) ×3 IMPLANT
SCISSORS LAP 5X35 DISP (ENDOMECHANICALS) ×3 IMPLANT
SET CHOLANGIOGRAPH 5 50 .035 (SET/KITS/TRAYS/PACK) ×3 IMPLANT
SET IRRIG TUBING LAPAROSCOPIC (IRRIGATION / IRRIGATOR) ×3 IMPLANT
SLEEVE ENDOPATH XCEL 5M (ENDOMECHANICALS) ×3 IMPLANT
SPECIMEN JAR SMALL (MISCELLANEOUS) ×3 IMPLANT
SUT MNCRL AB 4-0 PS2 18 (SUTURE) ×3 IMPLANT
TOWEL OR 17X24 6PK STRL BLUE (TOWEL DISPOSABLE) ×3 IMPLANT
TOWEL OR 17X26 10 PK STRL BLUE (TOWEL DISPOSABLE) ×3 IMPLANT
TRAY LAPAROSCOPIC (CUSTOM PROCEDURE TRAY) ×3 IMPLANT
TROCAR XCEL BLUNT TIP 100MML (ENDOMECHANICALS) ×3 IMPLANT
TROCAR XCEL NON-BLD 11X100MML (ENDOMECHANICALS) IMPLANT
TROCAR XCEL NON-BLD 5MMX100MML (ENDOMECHANICALS) ×3 IMPLANT

## 2013-03-24 NOTE — Anesthesia Postprocedure Evaluation (Signed)
  Anesthesia Post-op Note  Patient: Kathryn Larson  Procedure(s) Performed: Procedure(s): LAPAROSCOPIC CHOLECYSTECTOMY (N/A)  Patient Location: PACU  Anesthesia Type:General  Level of Consciousness: awake  Airway and Oxygen Therapy: Patient Spontanous Breathing  Post-op Pain: mild  Post-op Assessment: Post-op Vital signs reviewed  Post-op Vital Signs: Reviewed  Complications: No apparent anesthesia complications

## 2013-03-24 NOTE — Anesthesia Procedure Notes (Signed)
Procedure Name: Intubation Date/Time: 03/24/2013 5:24 PM Performed by: Maude Leriche D Pre-anesthesia Checklist: Patient identified, Emergency Drugs available, Suction available, Patient being monitored and Timeout performed Patient Re-evaluated:Patient Re-evaluated prior to inductionOxygen Delivery Method: Circle system utilized Preoxygenation: Pre-oxygenation with 100% oxygen Intubation Type: IV induction, Cricoid Pressure applied and Rapid sequence Laryngoscope Size: Miller and 2 Grade View: Grade I Tube type: Oral Tube size: 7.5 mm Number of attempts: 1 Airway Equipment and Method: Stylet Placement Confirmation: ETT inserted through vocal cords under direct vision,  positive ETCO2 and breath sounds checked- equal and bilateral Secured at: 22 cm Tube secured with: Tape Dental Injury: Teeth and Oropharynx as per pre-operative assessment

## 2013-03-24 NOTE — Progress Notes (Signed)
Patient still very symptomatic with cholelithiasis and acute cholecystitis.  Will take to surgery later today for laparoscopic cholecystectomy.  Risks and benefits explained to the patient and she wishes to proceed.  Kathryne Eriksson. Dahlia Bailiff, MD, [place] [phone] [facility]

## 2013-03-24 NOTE — Op Note (Addendum)
OPERATIVE REPORT  DATE OF OPERATION:  03/24/2013  PATIENT:  Kathryn Larson  43 y.o. female  PRE-OPERATIVE DIAGNOSIS:  Symptomatic cholelithiasis  POST-OPERATIVE DIAGNOSIS:  cholelitiasis; acute cholecystitis  PROCEDURE:  Procedure(s): LAPAROSCOPIC CHOLECYSTECTOMY  SURGEON:  Surgeon(s): [name], MD  ASSISTANT: None  ANESTHESIA:   general  EBL: 50 ml  BLOOD ADMINISTERED: none  DRAINS: none   SPECIMEN:  Source of Specimen:  Gallbladder and stones  COUNTS CORRECT:  YES  PROCEDURE DETAILS: The patient was taken to the operating room and placed on the table in the supine position.  After an adequate endotracheal anesthetic was administered, she was prepped with ChloroPrep, and then draped in the usual manner exposing the entire abdomen laterally, inferiorly and up  to the costal margins.  After a proper timeout was performed including identifying the patient and the procedure to be performed, a infra-umbilical 2.8BT midline incision was made using a #15 blade.  This was taken down to the fascia which was then incised with a #15 blade.  The edges of the fascia were tented up with Kocher clamps as the preperitoneal space was penetrated with a Kelly clamp into the peritoneum.  Once this was done, a pursestring suture of 0 Vicryl was passed around the fascial opening.  This was subsequently used to secure the [facility] cannula which was passed into the peritoneal cavity.  Once the [facility] cannula was in place, carbon dioxide gas was insufflated into the peritoneal cavity up to a maximal intra-abdominal pressure of 19m Hg.The laparoscope, with attached camera and light source, was passed into the peritoneal cavity to visualize the direct insertion of two right upper quadrant 560mcannulas, and a sup-xiphoid 72m11mannula.  Once all cannulas were in place, the dissection was begun.  Two ratcheted graspers were attached to the dome and infundibulum of the gallbladder and retracted towards the  anterior abdominal wall and the right upper quadrant.  Using cautery attached to a dissecting forceps, the peritoneum overlaying the triangle of Chalot and the hepatoduodenal triangle was dissected away exposing the cystic duct and the cystic artery.  A clip was placed on the gallbladder side of the cystic duct, then  the distal cystic duct was clipped multiple times then transected.  The gallbladder was then dissected out of the hepatic bed with some bleeding near the edge of the hepatic bed.  This was controlled with electrocautery and SurgiCel snowt.  The gallbladder was retrieved from the abdomen using an EndoCatch bag without event.  Once the gallbladder was removed, the bed was inspected for hemostasis.  Once excellent hemostasis was obtained all gas and fluids were aspirated from above the liver, then the cannulas were removed.  The infra-umbilical incision was closed using the pursestring suture which was in place.  0.25% bupivicaine with epinephrine was injected at all sites.  All 30m69m greater cannula sites were close using a running subcuticular stitch of 4-0 Monocryl.  5.0mm 73mnula sites were closed with Dermabond only.Steri-Strips and Tagaderm were used to complete the dressings at all sites.  At this point all needle, sponge, and instrument counts were correct.The patient was awakened from anesthesia and taken to the PACU in stable condition.   PATIENT DISPOSITION:  PACU - hemodynamically stable.   WYATTGwenyth Ober/20156:16 PM

## 2013-03-24 NOTE — Anesthesia Preprocedure Evaluation (Addendum)
Anesthesia Evaluation  Patient identified by MRN, date of birth, ID band Patient awake    Reviewed: Allergy & Precautions, H&P , NPO status , Patient's Chart, lab work & pertinent test results  Airway Mallampati: I TM Distance: >3 FB Neck ROM: Full    Dental  (+) Teeth Intact and Dental Advisory Given   Pulmonary former smoker,          Cardiovascular     Neuro/Psych Anxiety Bipolar Disorder    GI/Hepatic GERD-  Medicated and Controlled,  Endo/Other    Renal/GU      Musculoskeletal   Abdominal   Peds  Hematology   Anesthesia Other Findings   Reproductive/Obstetrics                         Anesthesia Physical Anesthesia Plan  ASA: III  Anesthesia Plan: General   Post-op Pain Management:    Induction: Intravenous  Airway Management Planned: Oral ETT  Additional Equipment:   Intra-op Plan:   Post-operative Plan: Extubation in OR  Informed Consent: I have reviewed the patients History and Physical, chart, labs and discussed the procedure including the risks, benefits and alternatives for the proposed anesthesia with the patient or authorized representative who has indicated his/her understanding and acceptance.   Dental advisory given  Plan Discussed with: CRNA, Surgeon and Anesthesiologist  Anesthesia Plan Comments:       Anesthesia Quick Evaluation

## 2013-03-24 NOTE — Transfer of Care (Signed)
Immediate Anesthesia Transfer of Care Note  Patient: Kathryn Larson  Procedure(s) Performed: Procedure(s): LAPAROSCOPIC CHOLECYSTECTOMY (N/A)  Patient Location: PACU  Anesthesia Type:General  Level of Consciousness: sedated  Airway & Oxygen Therapy: Patient Spontanous Breathing and Patient connected to nasal cannula oxygen  Post-op Assessment: Report given to PACU RN and Post -op Vital signs reviewed and stable  Post vital signs: Reviewed and stable  Complications: No apparent anesthesia complications

## 2013-03-25 ENCOUNTER — Encounter (HOSPITAL_COMMUNITY): Payer: Self-pay | Admitting: General Surgery

## 2013-03-25 ENCOUNTER — Encounter: Payer: Self-pay | Admitting: Obstetrics & Gynecology

## 2013-03-25 MED ORDER — IBUPROFEN 200 MG PO TABS: ORAL_TABLET | ORAL | Status: DC

## 2013-03-25 MED ORDER — IBUPROFEN 600 MG PO TABS
600.0000 mg | ORAL_TABLET | Freq: Four times a day (QID) | ORAL | Status: DC | PRN
  Administered 2013-03-26: 600 mg via ORAL
  Filled 2013-03-25: qty 1

## 2013-03-25 MED ORDER — ACETAMINOPHEN 325 MG PO TABS: 650.0000 mg | ORAL_TABLET | Freq: Four times a day (QID) | ORAL | Status: DC | PRN

## 2013-03-25 MED ORDER — OXYCODONE-ACETAMINOPHEN 5-325 MG PO TABS: 1.0000 | ORAL_TABLET | ORAL | Status: DC | PRN

## 2013-03-25 MED ORDER — HEPARIN SODIUM (PORCINE) 5000 UNIT/ML IJ SOLN
5000.0000 [IU] | Freq: Three times a day (TID) | INTRAMUSCULAR | Status: DC
Start: 2013-03-25 — End: 2013-03-26
  Administered 2013-03-25 – 2013-03-26 (×3): 5000 [IU] via SUBCUTANEOUS
  Filled 2013-03-25 (×5): qty 1

## 2013-03-25 MED ORDER — PANTOPRAZOLE SODIUM 40 MG PO TBEC
40.0000 mg | DELAYED_RELEASE_TABLET | Freq: Every day | ORAL | Status: DC
  Administered 2013-03-25 – 2013-03-26 (×2): 40 mg via ORAL
  Filled 2013-03-25 (×3): qty 1

## 2013-03-25 NOTE — Discharge Instructions (Signed)
[facility] ______[facility] °LAPAROSCOPIC SURGERY: POST OP INSTRUCTIONS °Always review your discharge instruction sheet given to you by the facility where your surgery was performed. °IF YOU HAVE DISABILITY OR FAMILY LEAVE FORMS, YOU MUST BRING THEM TO THE OFFICE FOR PROCESSING.   °DO NOT GIVE THEM TO YOUR DOCTOR. ° °1. A prescription for pain medication may be given to you upon discharge.  Take your pain medication as prescribed, if needed.  If narcotic pain medicine is not needed, then you may take acetaminophen (Tylenol) or ibuprofen (Advil) as needed. °2. Take your usually prescribed medications unless otherwise directed. °3. If you need a refill on your pain medication, please contact your pharmacy.  They will contact our office to request authorization. Prescriptions will not be filled after 5pm or on week-ends. °4. You should follow a light diet the first few days after arrival home, such as soup and crackers, etc.  Be sure to include lots of fluids daily. °5. Most patients will experience some swelling and bruising in the area of the incisions.  Ice packs will help.  Swelling and bruising can take several days to resolve.  °6. It is common to experience some constipation if taking pain medication after surgery.  Increasing fluid intake and taking a stool softener (such as Colace) will usually help or prevent this problem from occurring.  A mild laxative (Milk of Magnesia or Miralax) should be taken according to package instructions if there are no bowel movements after 48 hours. °7. Unless discharge instructions indicate otherwise, you may remove your bandages 24-48 hours after surgery, and you may shower at that time.  You may have steri-strips (small skin tapes) in place directly over the incision.  These strips should be left on the skin for 7-10 days.  If your surgeon used skin glue on the incision, you may shower in 24 hours.  The glue will flake off over the next 2-3 weeks.  Any sutures or  staples will be removed at the office during your follow-up visit. °8. ACTIVITIES:  You may resume regular (light) daily activities beginning the next day--such as daily self-care, walking, climbing stairs--gradually increasing activities as tolerated.  You may have sexual intercourse when it is comfortable.  Refrain from any heavy lifting or straining until approved by your doctor. °a. You may drive when you are no longer taking prescription pain medication, you can comfortably wear a seatbelt, and you can safely maneuver your car and apply brakes. °b. RETURN TO WORK:  __________________________________________________________ °9. You should see your doctor in the office for a follow-up appointment approximately 2-3 weeks after your surgery.  Make sure that you call for this appointment within a day or two after you arrive home to insure a convenient appointment time. °10. OTHER INSTRUCTIONS: __________________________________________________________________________________________________________________________ __________________________________________________________________________________________________________________________ °WHEN TO CALL YOUR DOCTOR: °1. Fever over 101.0 °2. Inability to urinate °3. Continued bleeding from incision. °4. Increased pain, redness, or drainage from the incision. °5. Increasing abdominal pain ° °The clinic staff is available to answer your questions during regular business hours.  Please don’t hesitate to call and ask to speak to one of the nurses for clinical concerns.  If you have a medical emergency, go to the nearest emergency room or call 911.  A surgeon from [facility] is always on call at the hospital. °[address], [address], [place], [place]  [address] ? [address] [address], [place], [place]   [address] °[phone] ? [phone] ? FAX [phone] °Web site:   [email or web address]  Laparoscopic Cholecystectomy Laparoscopic cholecystectomy  is surgery to remove the gallbladder. The gallbladder is located in the upper right part of the abdomen, behind the liver. It is a storage sac for bile produced in the liver. Bile aids in the digestion and absorption of fats. Cholecystectomy is often done for inflammation of the gallbladder (cholecystitis). This condition is usually caused by a buildup of gallstones (cholelithiasis) in your gallbladder. Gallstones can block the flow of bile, resulting in inflammation and pain. In severe cases, emergency surgery may be required. When emergency surgery is not required, you will have time to prepare for the procedure. Laparoscopic surgery is an alternative to open surgery. Laparoscopic surgery has a shorter recovery time. Your common bile duct may also need to be examined during the procedure. If stones are found in the common bile duct, they may be removed. LET [facility] CARE PROVIDER KNOW ABOUT:  Any allergies you have.  All medicines you are taking, including vitamins, herbs, eye drops, creams, and over-the-counter medicines.  Previous problems you or members of your family have had with the use of anesthetics.  Any blood disorders you have.  Previous surgeries you have had.  Medical conditions you have. RISKS AND COMPLICATIONS Generally, this is a safe procedure. However, as with any procedure, complications can occur. Possible complications include:  Infection.  Damage to the common bile duct, nerves, arteries, veins, or other internal organs such as the stomach, liver, or intestines.  Bleeding.  A stone may remain in the common bile duct.  A bile leak from the cyst duct that is clipped when your gallbladder is removed.  The need to convert to open surgery, which requires a larger incision in the abdomen. This may be necessary if your surgeon thinks it is not safe to continue with a laparoscopic procedure. BEFORE THE PROCEDURE  Ask your health care provider about changing or  stopping any regular medicines. You will need to stop taking aspirin or blood thinners at least 5 days prior to surgery.  Do not eat or drink anything after midnight the night before surgery.  Let your health care provider know if you develop a cold or other infectious problem before surgery. PROCEDURE   You will be given medicine to make you sleep through the procedure (general anesthetic). A breathing tube will be placed in your mouth.  When you are asleep, your surgeon will make several small cuts (incisions) in your abdomen.  A thin, lighted tube with a tiny camera on the end (laparoscope) is inserted through one of the small incisions. The camera on the laparoscope sends a picture to a TV screen in the operating room. This gives the surgeon a good view inside your abdomen.  A gas will be pumped into your abdomen. This expands your abdomen so that the surgeon has more room to perform the surgery.  Other tools needed for the procedure are inserted through the other incisions. The gallbladder is removed through one of the incisions.  After the removal of your gallbladder, the incisions will be closed with stitches, staples, or skin glue. AFTER THE PROCEDURE  You will be taken to a recovery area where your progress will be checked often.  You may be allowed to go home the same day if your pain is controlled and you can tolerate liquids. Document Released: 02/13/2005 Document Revised: 12/04/2012 Document Reviewed: 09/25/2012 [facility] Patient Information 2014 [place].

## 2013-03-25 NOTE — Progress Notes (Signed)
1 Day Post-Op  Subjective: Sore trying some gingerale, hasn't been out of bed much. She lives alone.  Objective: Vital signs in last 24 hours: Temp:  [97.3 F (36.3 C)-98.7 F (37.1 C)] 98.6 F (37 C) (01/27 0505) Pulse Rate:  [51-81] 63 (01/27 0505) Resp:  [15-18] 16 (01/27 0505) BP: (99-143)/(57-90) 124/76 mmHg (01/27 0505) SpO2:  [93 %-100 %] 95 % (01/27 0505) Last BM Date: 03/18/13 Diet: fat modified Afebrile, VSS No labs Intake/Output from previous day: 01/26 0701 - 01/27 0700 In: 1000 [I.V.:1000] Out: 160 [Urine:150; Blood:10] Intake/Output this shift:    General appearance: alert, cooperative and no distress Resp: few rales in bases. GI: soft, still very tender and sore, port sites look fine.  Lab Results:   Recent Labs  03/23/13 0648  WBC 6.5  HGB 13.9  HCT 40.0  PLT 358    BMET  Recent Labs  03/23/13 0648  NA 139  K 3.4*  CL 104  CO2 22  GLUCOSE 105*  BUN 7  CREATININE 0.83  CALCIUM 8.7   PT/INR No results found for this basename: LABPROT, INR,  in the last 72 hours   Recent Labs Lab 03/21/13 1930 03/23/13 0648  AST 18 18  ALT 23 19  ALKPHOS 84 88  BILITOT 0.3 0.4  PROT 7.5 7.2  ALBUMIN 3.8 3.6     Lipase     Component Value Date/Time   LIPASE 35 03/23/2013 0648     Studies/Results: US Abdomen Complete  03/23/2013   CLINICAL DATA:  Epigastric pain  EXAM: ULTRASOUND ABDOMEN COMPLETE  COMPARISON:  None.  FINDINGS: Gallbladder:  Multiple gallstones are present. The largest is 2.2 cm. No wall thickening or Murphy's sign. There is debris and sludge within the lumen of the gallbladder.  Common bile duct:  Diameter: The common bile duct is dilated at 9 mm.  Liver:  No focal lesion identified. Within normal limits in parenchymal echogenicity.  IVC:  No abnormality visualized.  Pancreas:  No mass.  Pancreatic duct is upper normal in caliber at 3.6 mm.  Spleen:  Size and appearance within normal limits.  Right Kidney:  Length: 11.2 cm in  length. Normal cortical echogenicity. No hydronephrosis or mass.  Left Kidney:  Length: 11.4 cm in length. No hydronephrosis. Normal cortical echogenicity. 8 mm hyperechoic lesion in the lower pole cortex.  Abdominal aorta:  Maximal diameter is 2.6 cm.  Other findings:  None.  IMPRESSION: Cholelithiasis.  Common bile duct is dilated.  Biliary obstruction is not excluded.  8 mm hyperechoic lesion in the left kidney. MR with without contrast is recommended.  Maximal aortic diameter is 2.6 cm. Ectatic abdominal aorta at risk for aneurysm development. Recommend follow up by [place] in 5 years. This recommendation follows ACR consensus guidelines: White Paper of the ACR Incidental Findings Committee II on Vascular Findings. J Am Coll Radiol 2013; 10:789-794.   Electronically Signed   By: Maryclare Bean M.D.   On: 03/23/2013 09:25   Ct Abdomen Pelvis W Contrast  03/23/2013   CLINICAL DATA:  Right upper quadrant pain. Numerous tiny gallstones.  EXAM: CT ABDOMEN AND PELVIS WITH CONTRAST  TECHNIQUE: Multidetector CT imaging of the abdomen and pelvis was performed using the standard protocol following bolus administration of intravenous contrast.  CONTRAST:  150m OMNIPAQUE IOHEXOL 300 MG/ML  SOLN  COMPARISON:  Ultrasound dated 03/23/2013  FINDINGS: There is a 2.2 cm calcified stone in the neck of the gallbladder. There are multiple small stones more distally  in the gallbladder. There is pericholecystic fluid around the distended gallbladder. There is slight intra and extrahepatic ductal dilatation. Common bile duct is 9 mm in diameter. There are subtle filling defects in the distal common bile duct consistent with tiny stones.  Liver parenchyma appears normal. Spleen, pancreas, adrenal glands, and kidneys are normal. Pancreatic duct is not dilated. The bowel is normal including the terminal ileum and appendix. Uterus and ovaries are normal. No free air or free fluid. No significant osseous abnormality.  IMPRESSION: 1.  Cholelithiasis and cholecystitis. 2. Common bile duct stones creating biliary ductal dilatation.   Electronically Signed   By: Rozetta Nunnery M.D.   On: 03/23/2013 18:25    Medications: . heparin subcutaneous  5,000 Units Subcutaneous Q8H  . pantoprazole  40 mg Oral Daily  . sertraline  100 mg Oral TID  . sucralfate  1 g Oral TID WC & HS   . dextrose 5 % and 0.9% NaCl 100 mL/hr at 03/24/13 1914   Prior to Admission medications   Medication Sig Start Date End Date Taking? Authorizing Provider  esomeprazole (NEXIUM) 20 MG packet Take 20 mg by mouth daily before breakfast. 03/21/13  Yes Kassie Mends, MD  sertraline (ZOLOFT) 100 MG tablet Take 100 mg by mouth 3 (three) times daily.   Yes Historical Provider, MD  sucralfate (CARAFATE) 1 GM/10ML suspension Take 10 mLs (1 g total) by mouth 4 (four) times daily -  with meals and at bedtime. 03/21/13  Yes Kassie Mends, MD     Assessment/Plan 1.  cholelitiasis; acute cholecystitis, s/p LAPAROSCOPIC CHOLECYSTECTOMY, 03/24/13, [name], MD 2. GERD 3.  Hx of Bipolar affective/Panic attacks 4.  Hx of seizures 5.  Body mass index is 47.8 6.  Carpal tunnel syndrome 7.  On disability for prior bilateral ankle fracture   Plan:  Mobilize, advance diet, decrease IV fluids, resume home meds. IS, PPI, and home dose of Carafate. Hope to send home after lunch.      LOS: 2 days    Kathryn Larson 03/25/2013

## 2013-03-25 NOTE — Progress Notes (Signed)
Doing better and should be able to go home later today.  Kathryn Larson. Dahlia Bailiff, MD, [place] [phone] [facility]

## 2013-03-26 ENCOUNTER — Encounter (HOSPITAL_COMMUNITY): Payer: Self-pay | Admitting: General Surgery

## 2013-03-26 DIAGNOSIS — K8000 Calculus of gallbladder with acute cholecystitis without obstruction: Secondary | ICD-10-CM

## 2013-03-26 DIAGNOSIS — F319 Bipolar disorder, unspecified: Secondary | ICD-10-CM | POA: Diagnosis present

## 2013-03-26 DIAGNOSIS — K219 Gastro-esophageal reflux disease without esophagitis: Secondary | ICD-10-CM | POA: Diagnosis present

## 2013-03-26 DIAGNOSIS — E66813 Obesity, class 3: Secondary | ICD-10-CM

## 2013-03-26 HISTORY — DX: Morbid (severe) obesity due to excess calories: E66.01

## 2013-03-26 HISTORY — DX: Bipolar disorder, unspecified: F31.9

## 2013-03-26 HISTORY — DX: Obesity, class 3: E66.813

## 2013-03-26 HISTORY — DX: Calculus of gallbladder with acute cholecystitis without obstruction: K80.00

## 2013-03-26 NOTE — Progress Notes (Signed)
Patient doing well.  Okay to go home and follow up as an outpatient.  Kathryne Eriksson. Dahlia Bailiff, MD, [place] [phone] [facility]

## 2013-03-26 NOTE — MAU Provider Note (Signed)
`````  Attestation of Attending Supervision of Advanced Practitioner: Evaluation and management procedures were performed by the PA/NP/CNM/OB Fellow under my supervision/collaboration. Chart reviewed and agree with management and plan.  Dorrian [place] V 03/26/2013 5:35 PM

## 2013-03-26 NOTE — Progress Notes (Signed)
2 Days Post-Op  Subjective: No complaints, did well with supper, passing gas.    Objective: Vital signs in last 24 hours: Temp:  [98.3 F (36.8 C)-99.3 F (37.4 C)] 98.5 F (36.9 C) (01/28 0542) Pulse Rate:  [61-72] 65 (01/28 0542) Resp:  [16-20] 20 (01/27 2139) BP: (109-121)/(63-74) 109/63 mmHg (01/28 0542) SpO2:  [97 %-98 %] 98 % (01/28 0542) Last BM Date: 03/18/13 360 PO recorded Afebrile, VSS No labs Fat mod diet Intake/Output from previous day: 01/27 0701 - 01/28 0700 In: 360 [P.O.:360] Out: -  Intake/Output this shift:    General appearance: alert, cooperative and no distress GI: soft, sore, incisions look fine +BS  Lab Results:  No results found for this basename: WBC, HGB, HCT, PLT,  in the last 72 hours  BMET No results found for this basename: NA, K, CL, CO2, GLUCOSE, BUN, CREATININE, CALCIUM,  in the last 72 hours PT/INR No results found for this basename: LABPROT, INR,  in the last 72 hours   Recent Labs Lab 03/21/13 1930 03/23/13 0648  AST 18 18  ALT 23 19  ALKPHOS 84 88  BILITOT 0.3 0.4  PROT 7.5 7.2  ALBUMIN 3.8 3.6     Lipase     Component Value Date/Time   LIPASE 35 03/23/2013 0648     Studies/Results: No results found.  Medications: . heparin subcutaneous  5,000 Units Subcutaneous Q8H  . pantoprazole  40 mg Oral Daily  . sertraline  100 mg Oral TID  . sucralfate  1 g Oral TID WC & HS    Assessment/Plan 1 Day Post-Op  Subjective: Sore trying some gingerale, hasn't been out of bed much. She lives alone.   Objective: Vital signs in last 24 hours: Temp:  [97.3 F (36.3 C)-98.7 F (37.1 C)] 98.6 F (37 C) (01/27 0505) Pulse Rate:  [51-81] 63 (01/27 0505) Resp:  [15-18] 16 (01/27 0505) BP: (99-143)/(57-90) 124/76 mmHg (01/27 0505) SpO2:  [93 %-100 %] 95 % (01/27 0505) Last BM Date: 03/18/13 Diet: fat modified Afebrile, VSS No labs Intake/Output from previous day: 01/26 0701 - 01/27 0700 In: 1000 [I.V.:1000] Out: 160  [Urine:150; Blood:10] Intake/Output this shift:   General appearance: alert, cooperative and no distress Resp: few rales in bases. GI: soft, still very tender and sore, port sites look fine.   Lab Results:   Recent Labs   03/23/13 0648   WBC  6.5   HGB  13.9   HCT  40.0   PLT  358      BMET Recent Labs   03/23/13 0648   NA  139   K  3.4*   CL  104   CO2  22   GLUCOSE  105*   BUN  7   CREATININE  0.83   CALCIUM  8.7    PT/INR No results found for this basename: LABPROT, INR,  in the last 72 hours   Recent Labs Lab  03/21/13 1930  03/23/13 0648   AST  18  18   ALT  23  19   ALKPHOS  84  88   BILITOT  0.3  0.4   PROT  7.5  7.2   ALBUMIN  3.8  3.6        Lipase      Component  Value  Date/Time     LIPASE  35  03/23/2013 0648       Studies/Results: US Abdomen Complete   03/23/2013   CLINICAL DATA:  Epigastric  pain  EXAM: ULTRASOUND ABDOMEN COMPLETE  COMPARISON:  None.  FINDINGS: Gallbladder:  Multiple gallstones are present. The largest is 2.2 cm. No wall thickening or Murphy's sign. There is debris and sludge within the lumen of the gallbladder.  Common bile duct:  Diameter: The common bile duct is dilated at 9 mm.  Liver:  No focal lesion identified. Within normal limits in parenchymal echogenicity.  IVC:  No abnormality visualized.  Pancreas:  No mass.  Pancreatic duct is upper normal in caliber at 3.6 mm.  Spleen:  Size and appearance within normal limits.  Right Kidney:  Length: 11.2 cm in length. Normal cortical echogenicity. No hydronephrosis or mass.  Left Kidney:  Length: 11.4 cm in length. No hydronephrosis. Normal cortical echogenicity. 8 mm hyperechoic lesion in the lower pole cortex.  Abdominal aorta:  Maximal diameter is 2.6 cm.  Other findings:  None.  IMPRESSION: Cholelithiasis.  Common bile duct is dilated.  Biliary obstruction is not excluded.  8 mm hyperechoic lesion in the left kidney. MR with without contrast is recommended.  Maximal aortic  diameter is 2.6 cm. Ectatic abdominal aorta at risk for aneurysm development. Recommend follow up by [place] in 5 years. This recommendation follows ACR consensus guidelines: White Paper of the ACR Incidental Findings Committee II on Vascular Findings. J Am Coll Radiol 2013; 10:789-794.   Electronically Signed   By: Maryclare Bean M.D.   On: 03/23/2013 09:25    Ct Abdomen Pelvis W Contrast   03/23/2013   CLINICAL DATA:  Right upper quadrant pain. Numerous tiny gallstones.  EXAM: CT ABDOMEN AND PELVIS WITH CONTRAST  TECHNIQUE: Multidetector CT imaging of the abdomen and pelvis was performed using the standard protocol following bolus administration of intravenous contrast.  CONTRAST:  140m OMNIPAQUE IOHEXOL 300 MG/ML  SOLN  COMPARISON:  Ultrasound dated 03/23/2013  FINDINGS: There is a 2.2 cm calcified stone in the neck of the gallbladder. There are multiple small stones more distally in the gallbladder. There is pericholecystic fluid around the distended gallbladder. There is slight intra and extrahepatic ductal dilatation. Common bile duct is 9 mm in diameter. There are subtle filling defects in the distal common bile duct consistent with tiny stones.  Liver parenchyma appears normal. Spleen, pancreas, adrenal glands, and kidneys are normal. Pancreatic duct is not dilated. The bowel is normal including the terminal ileum and appendix. Uterus and ovaries are normal. No free air or free fluid. No significant osseous abnormality.  IMPRESSION: 1. Cholelithiasis and cholecystitis. 2. Common bile duct stones creating biliary ductal dilatation.   Electronically Signed   By: JRozetta NunneryM.D.   On: 03/23/2013 18:25     Medications: .  heparin subcutaneous   5,000 Units  Subcutaneous  Q8H   .  pantoprazole   40 mg  Oral  Daily   .  sertraline   100 mg  Oral  TID   .  sucralfate   1 g  Oral  TID WC & HS    .  dextrose 5 % and 0.9% NaCl  100 mL/hr at 03/24/13 1914    Prior to Admission medications    Medication  Sig   Start Date  End Date  Taking?  Authorizing Provider   esomeprazole (NEXIUM) 20 MG packet  Take 20 mg by mouth daily before breakfast.  03/21/13    Yes  KKassie Mends MD   sertraline (ZOLOFT) 100 MG tablet  Take 100 mg by mouth 3 (three) times daily.  Yes  Historical Provider, MD   sucralfate (CARAFATE) 1 GM/10ML suspension  Take 10 mLs (1 g total) by mouth 4 (four) times daily -  with meals and at bedtime.  03/21/13    Yes  Kassie Mends, MD          Assessment/Plan 1.  cholelitiasis; acute cholecystitis, s/p LAPAROSCOPIC CHOLECYSTECTOMY, 03/24/13, [name], MD 2. GERD 3.  Hx of Bipolar affective/Panic attacks 4.  Hx of seizures 5.  Body mass index is 47.8 6.  Carpal tunnel syndrome 7.  On disability for prior bilateral ankle fracture       Plan:  Home today.   LOS: 3 days    Dacie Mandel 03/26/2013

## 2013-03-26 NOTE — ED Provider Notes (Signed)
Medical screening examination/treatment/procedure(s) were performed by non-physician practitioner and as supervising physician I was immediately available for consultation/collaboration.  EKG Interpretation    Date/Time:    Ventricular Rate:    PR Interval:    QRS Duration:   QT Interval:    QTC Calculation:   R Axis:     Text Interpretation:               Threasa Beards, MD 03/26/13 [phone]

## 2013-03-26 NOTE — Discharge Summary (Signed)
Physician Discharge Summary  Patient ID: Kathryn Larson MRN: [id number] DOB/AGE: 03-Mar-1970 43 y.o.  Admit date: 03/23/2013 Discharge date: 03/26/2013  Admission Diagnoses:  1. cholelitiasis; acute cholecystitis,  2. GERD  3. Hx of Bipolar affective/Panic attacks  4. Hx of seizures  5. Body mass index is 47.8  6. Carpal tunnel syndrome  7. On disability for prior bilateral ankle fracture   Discharge Diagnoses:  Same Principal Problem:   Calculus of gallbladder with acute cholecystitis, without mention of obstruction Active Problems:   Bipolar disorder, unspecified   Obesity, Class III, BMI 40-49.9 (morbid obesity)   GERD (gastroesophageal reflux disease)   PROCEDURES:  LAPAROSCOPIC CHOLECYSTECTOMY, 03/24/13, [name], MD    Hospital Course: 43 y/o AA female with h/o GERD presents to [facility] with complaints of severe abdominal pain since Sunday night. She thought it was her GERD which she has had since 1994 when pregnant with her son. However, this time it did not improve with, Prilosec. She went to primary care on Friday who thought it was her GERD and have her carafate and instructed on taking Prilosec. She thinks it feels different than her normal GERD heartburn. C/o RUQ/LUQ and epigastric abdominal pain, anorexia, nausea, and bilious vomiting. The pain radiates to her back and into her chest. She didn't try any tums etc, but did take a BC powder on Monday which she promptly threw up. She denies any post-prandial pain/bloating/N/V prior to Sunday, but notes here GERD has been worsening over the last month. She denies any excessive NSAID use, alcohol, and smokes every other day. Admits to some minimal hematemesis from voming so much. The patient then confides in me that she has had many thoughts of harming herself even comiting suicide secondary to the pain which she has been experiencing saying "I'm already in so much pain it wouldn't matter if I was still here or not". She says she  has tried in the past and been admitted to the hospital. Workup shows normal labs including LFT's and WBC, afebrile, with gallstones on ultrasound with sludge without evidence of cholecystitis. Dilated CBD at 36m. Also has left kidney lesion and enlarged aortic diameter at 2.6.  Pt was admitted and taken for surgery, 03/24/13.  She has done well post op with no complications.  She was mobilized and diet advacned and she is ready for discharge AM today. [facility]. Condition on D/c:  Improved.   Disposition: 01-Home or Self Care       Future Appointments Provider Department Dept Phone   04/15/2013 3:15 PM [facility] [phone]   05/01/2013 1:45 PM [phone]      Medication List         acetaminophen 325 MG tablet  Commonly known as:  TYLENOL  Take 2 tablets (650 mg total) by mouth every 6 (six) hours as needed for mild pain, moderate pain, fever or headache (Do not take more than 4000 mg of tylenol (acetaminophen per day, it is in your prescribed medicine.)).     esomeprazole 20 MG packet  Commonly known as:  NEXIUM  Take 20 mg by mouth daily before breakfast.     ibuprofen 200 MG tablet  Commonly known as:  ADVIL,MOTRIN  You can take 2-3 tablets every 8 hours for pain as needed.     oxyCODONE-acetaminophen 5-325 MG per tablet  Commonly known as:  PERCOCET/ROXICET  Take 1-2 tablets  by mouth every 4 (four) hours as needed for moderate pain.     sertraline 100 MG tablet  Commonly known as:  ZOLOFT  Take 100 mg by mouth 3 (three) times daily.     sucralfate 1 GM/10ML suspension  Commonly known as:  CARAFATE  Take 10 mLs (1 g total) by mouth 4 (four) times daily -  with meals and at bedtime.       Follow-up Information   Follow up with [facility] on 04/15/2013. (arrive by 2:45PM for a 3:15PM appt)    Contact information:   [address] [place]   [place] [address] [phone]       Signed: Earnstine Regal 03/26/2013, 7:32 AM

## 2013-03-28 NOTE — H&P (Signed)
Requesting MD: Bernarda Caffey  Chief Complaint/Reason for Consult: RUQ  HPI:  43 y/o AA female with h/o GERD presents to [facility] with complaints of severe abdominal pain since Sunday night. She thought it was her GERD which she has had since 1994 when pregnant with her son. However, this time it did not improve with, Prilosec. She went to primary care on Friday who thought it was her GERD and have her carafate and instructed on taking Prilosec. She thinks it feels different than her normal GERD heartburn. C/o RUQ/LUQ and epigastric abdominal pain, anorexia, nausea, and bilious vomiting. The pain radiates to her back and into her chest. She didn't try any tums etc, but did take a BC powder on Monday which she promptly threw up. She denies any post-prandial pain/bloating/N/V prior to Sunday, but notes here GERD has been worsening over the last month. She denies any excessive NSAID use, alcohol, and smokes every other day. Admits to some minimal hematemesis from voming so much. The patient then confides in me that she has had many thoughts of harming herself even comiting suicide secondary to the pain which she has been experiencing saying "I'm already in so much pain it wouldn't matter if I was still here or not". She says she has tried in the past and been admitted to the hospital.  Workup shows normal labs including LFT's and WBC, afebrile, with gallstones on ultrasound with sludge without evidence of cholecystitis. Dilated CBD at 42m. Also has left kidney lesion and enlarged aortic diameter at 2.6.  ROS:  All systems reviewed and otherwise negative except for as above  No family history on file.  Past Medical History   Diagnosis  Date   .  GERD (gastroesophageal reflux disease)    .  Bipolar affective    .  Rectal bleed    .  Anxiety    .  Obesity    .  Panic attacks    .  Seizures    .  Carpal tunnel syndrome    .  Ovarian cyst     Past Surgical History   Procedure  Laterality  Date   .  Ankle  fracture surgery       Bilateral   .  Ectopic pregnancy surgery     Social History: reports that she quit smoking about 3 years ago. She does not have any smokeless tobacco history on file. She reports that she does not drink alcohol or use illicit drugs.  Allergies: No Known Allergies  (Not in a hospital admission)  Blood pressure 130/76, pulse 53, temperature 98.5 F (36.9 C), temperature source Oral, resp. rate 22, last menstrual period 03/17/2013, SpO2 96.00%.  Physical Exam:  General: pleasant, WD/WN obese AA female who is laying in bed in NAD  HEENT: head is normocephalic, atraumatic. Sclera are noninjected. PERRL. Ears and nose without any masses or lesions. Mouth is pink and moist  Heart: regular, rate, and rhythm. No obvious murmurs, gallops, or rubs noted. Palpable pedal pulses bilaterally  Lungs: CTAB, no wheezes, rhonchi, or rales noted. Respiratory effort nonlabored  Abd: obese, soft, moderate RUQ/LUQ/epigastrium pain, +BS, no masses, hernias, or organomegaly, horizontal incision in the suprapubic area well healed.  MS: all 4 extremities are symmetrical with no cyanosis, clubbing, or edema.  Skin: warm and dry with no masses, lesions, or rashes  Psych: A&Ox3 with noted anxiety, expresses suicidal ideations, but no current intent to harm herself while in the ER. +hopelessness surrounding her pain/discomfort.  Results for  orders placed during the hospital encounter of 03/23/13 (from the past 48 hour(s))   CBC WITH DIFFERENTIAL Status: Abnormal    Collection Time    03/23/13 6:48 AM   Result  Value  Range    WBC  6.5  4.0 - 10.5 K/uL    RBC  4.36  3.87 - 5.11 MIL/uL    Hemoglobin  13.9  12.0 - 15.0 g/dL    HCT  40.0  36.0 - 46.0 %    MCV  91.7  78.0 - 100.0 fL    MCH  31.9  26.0 - 34.0 pg    MCHC  34.8  30.0 - 36.0 g/dL    RDW  12.9  11.5 - 15.5 %    Platelets  358  150 - 400 K/uL    Neutrophils Relative %  52  43 - 77 %    Neutro Abs  3.4  1.7 - 7.7 K/uL    Lymphocytes  Relative  31  12 - 46 %    Lymphs Abs  2.0  0.7 - 4.0 K/uL    Monocytes Relative  10  3 - 12 %    Monocytes Absolute  0.7  0.1 - 1.0 K/uL    Eosinophils Relative  7 (*)  0 - 5 %    Eosinophils Absolute  0.4  0.0 - 0.7 K/uL    Basophils Relative  0  0 - 1 %    Basophils Absolute  0.0  0.0 - 0.1 K/uL   COMPREHENSIVE METABOLIC PANEL Status: Abnormal    Collection Time    03/23/13 6:48 AM   Result  Value  Range    Sodium  139  137 - 147 mEq/L    Potassium  3.4 (*)  3.7 - 5.3 mEq/L    Chloride  104  96 - 112 mEq/L    CO2  22  19 - 32 mEq/L    Glucose, Bld  105 (*)  70 - 99 mg/dL    BUN  7  6 - 23 mg/dL    Creatinine, Ser  0.83  0.50 - 1.10 mg/dL    Calcium  8.7  8.4 - 10.5 mg/dL    Total Protein  7.2  6.0 - 8.3 g/dL    Albumin  3.6  3.5 - 5.2 g/dL    AST  18  0 - 37 U/L    ALT  19  0 - 35 U/L    Alkaline Phosphatase  88  39 - 117 U/L    Total Bilirubin  0.4  0.3 - 1.2 mg/dL    GFR calc non Af Amer  87 (*)  >90 mL/min    GFR calc Af Amer  >90  >90 mL/min    Comment:  (NOTE)     The eGFR has been calculated using the CKD EPI equation.     This calculation has not been validated in all clinical situations.     eGFR's persistently <90 mL/min signify possible Chronic Kidney     Disease.   LIPASE, BLOOD Status: None    Collection Time    03/23/13 6:48 AM   Result  Value  Range    Lipase  35  11 - 59 U/L   URINALYSIS, ROUTINE W REFLEX MICROSCOPIC Status: Abnormal    Collection Time    03/23/13 7:23 AM   Result  Value  Range    Color, Urine  YELLOW  YELLOW    APPearance  CLOUDY (*)  CLEAR    Specific Gravity, Urine  1.019  1.005 - 1.030    pH  7.0  5.0 - 8.0    Glucose, UA  NEGATIVE  NEGATIVE mg/dL    Hgb urine dipstick  MODERATE (*)  NEGATIVE    Bilirubin Urine  NEGATIVE  NEGATIVE    Ketones, ur  NEGATIVE  NEGATIVE mg/dL    Protein, ur  NEGATIVE  NEGATIVE mg/dL    Urobilinogen, UA  0.2  0.0 - 1.0 mg/dL    Nitrite  NEGATIVE  NEGATIVE    Leukocytes, UA  NEGATIVE  NEGATIVE    URINE MICROSCOPIC-ADD ON Status: Abnormal    Collection Time    03/23/13 7:23 AM   Result  Value  Range    Squamous Epithelial / LPF  FEW (*)  RARE    RBC / HPF  7-10  <3 RBC/hpf   POCT PREGNANCY, URINE Status: None    Collection Time    03/23/13 7:31 AM   Result  Value  Range    Preg Test, Ur  NEGATIVE  NEGATIVE    Comment:      THE SENSITIVITY OF THIS     METHODOLOGY IS >24 mIU/mL   US Abdomen Complete  03/23/2013 CLINICAL DATA: Epigastric pain EXAM: ULTRASOUND ABDOMEN COMPLETE COMPARISON: None. FINDINGS: Gallbladder: Multiple gallstones are present. The largest is 2.2 cm. No wall thickening or Murphy's sign. There is debris and sludge within the lumen of the gallbladder. Common bile duct: Diameter: The common bile duct is dilated at 9 mm. Liver: No focal lesion identified. Within normal limits in parenchymal echogenicity. IVC: No abnormality visualized. Pancreas: No mass. Pancreatic duct is upper normal in caliber at 3.6 mm. Spleen: Size and appearance within normal limits. Right Kidney: Length: 11.2 cm in length. Normal cortical echogenicity. No hydronephrosis or mass. Left Kidney: Length: 11.4 cm in length. No hydronephrosis. Normal cortical echogenicity. 8 mm hyperechoic lesion in the lower pole cortex. Abdominal aorta: Maximal diameter is 2.6 cm. Other findings: None. IMPRESSION: Cholelithiasis. Common bile duct is dilated. Biliary obstruction is not excluded. 8 mm hyperechoic lesion in the left kidney. MR with without contrast is recommended. Maximal aortic diameter is 2.6 cm. Ectatic abdominal aorta at risk for aneurysm development. Recommend follow up by [place] in 5 years. This recommendation follows ACR consensus guidelines: White Paper of the ACR Incidental Findings Committee II on Vascular Findings. J Am Coll Radiol 2013; 10:789-794. Electronically Signed By: Maryclare Bean M.D. On: 03/23/2013 09:25  Assessment/Plan  Cholelithiasis without evidence of cholecystitis  RUQ/LUQ/Epigastric abdominal  pain  GERD - more symptomatic recently  Bipolar/Anxiety  H/o Seizure D.O.  Plan:  1. Basal CT scan it seems the patient has a large gallstone lodged neck of the gallbladder.  2. We will admit the patient at this time to proceed with a laparoscopic cholecystectomy.  3. I will keep the patient n.p.o., IV fluids, and placed on antibiotics.

## 2013-04-15 ENCOUNTER — Encounter (INDEPENDENT_AMBULATORY_CARE_PROVIDER_SITE_OTHER): Payer: Medicaid Other | Admitting: General Surgery

## 2013-04-29 ENCOUNTER — Encounter (INDEPENDENT_AMBULATORY_CARE_PROVIDER_SITE_OTHER): Payer: Medicaid Other

## 2013-05-01 ENCOUNTER — Telehealth: Payer: Self-pay

## 2013-05-01 ENCOUNTER — Encounter: Payer: Medicaid Other | Admitting: Obstetrics & Gynecology

## 2013-05-01 NOTE — Telephone Encounter (Signed)
Pt. Missed today's appointment with Dr. Maylene RoesTamala Julian. Called pt. Who stated she knows she missed her appointment and would like to re-schedule. Informed pt. I will let the front desk know and they will call her with an appointment next available and let her know that it may not be until April. Pt. Verbalized understanding and had no further questions or concerns.

## 2013-05-07 ENCOUNTER — Encounter: Payer: Self-pay | Admitting: Obstetrics & Gynecology

## 2013-05-20 ENCOUNTER — Encounter (INDEPENDENT_AMBULATORY_CARE_PROVIDER_SITE_OTHER): Payer: Medicaid Other

## 2013-06-10 ENCOUNTER — Encounter (INDEPENDENT_AMBULATORY_CARE_PROVIDER_SITE_OTHER): Payer: Medicaid Other

## 2013-06-12 ENCOUNTER — Encounter (INDEPENDENT_AMBULATORY_CARE_PROVIDER_SITE_OTHER): Payer: Self-pay

## 2013-06-20 ENCOUNTER — Ambulatory Visit: Payer: Medicaid Other | Admitting: Obstetrics & Gynecology

## 2013-12-29 ENCOUNTER — Encounter (HOSPITAL_COMMUNITY): Payer: Self-pay | Admitting: General Surgery

## 2014-02-11 ENCOUNTER — Emergency Department (HOSPITAL_COMMUNITY)
Admission: EM | Admit: 2014-02-11 | Discharge: 2014-02-11 | Disposition: A | Discharge status: Home / Self Care | Payer: Medicaid Other | Attending: Emergency Medicine | Admitting: Emergency Medicine

## 2014-02-11 ENCOUNTER — Emergency Department (HOSPITAL_COMMUNITY): Payer: Medicaid Other

## 2014-02-11 ENCOUNTER — Encounter (HOSPITAL_COMMUNITY): Payer: Self-pay | Admitting: Emergency Medicine

## 2014-02-11 DIAGNOSIS — Z87891 Personal history of nicotine dependence: Secondary | ICD-10-CM | POA: Insufficient documentation

## 2014-02-11 DIAGNOSIS — F410 Panic disorder [episodic paroxysmal anxiety] without agoraphobia: Secondary | ICD-10-CM | POA: Insufficient documentation

## 2014-02-11 DIAGNOSIS — F319 Bipolar disorder, unspecified: Secondary | ICD-10-CM | POA: Diagnosis not present

## 2014-02-11 DIAGNOSIS — H109 Unspecified conjunctivitis: Secondary | ICD-10-CM

## 2014-02-11 DIAGNOSIS — R059 Cough, unspecified: Secondary | ICD-10-CM

## 2014-02-11 DIAGNOSIS — Z8742 Personal history of other diseases of the female genital tract: Secondary | ICD-10-CM | POA: Insufficient documentation

## 2014-02-11 DIAGNOSIS — K219 Gastro-esophageal reflux disease without esophagitis: Secondary | ICD-10-CM | POA: Diagnosis not present

## 2014-02-11 DIAGNOSIS — H5712 Ocular pain, left eye: Secondary | ICD-10-CM | POA: Diagnosis present

## 2014-02-11 DIAGNOSIS — J069 Acute upper respiratory infection, unspecified: Secondary | ICD-10-CM | POA: Diagnosis not present

## 2014-02-11 DIAGNOSIS — H1032 Unspecified acute conjunctivitis, left eye: Secondary | ICD-10-CM | POA: Diagnosis not present

## 2014-02-11 DIAGNOSIS — R05 Cough: Secondary | ICD-10-CM

## 2014-02-11 DIAGNOSIS — Z79899 Other long term (current) drug therapy: Secondary | ICD-10-CM | POA: Insufficient documentation

## 2014-02-11 DIAGNOSIS — Z8669 Personal history of other diseases of the nervous system and sense organs: Secondary | ICD-10-CM | POA: Insufficient documentation

## 2014-02-11 DIAGNOSIS — H209 Unspecified iridocyclitis: Secondary | ICD-10-CM | POA: Diagnosis not present

## 2014-02-11 LAB — CBC WITH DIFFERENTIAL/PLATELET
BASOS ABS: 0 10*3/uL (ref 0.0–0.1)
BASOS PCT: 0 % (ref 0–1)
Eosinophils Absolute: 0.1 10*3/uL (ref 0.0–0.7)
Eosinophils Relative: 2 % (ref 0–5)
HCT: 43.2 % (ref 36.0–46.0)
Hemoglobin: 15.1 g/dL — ABNORMAL HIGH (ref 12.0–15.0)
Lymphocytes Relative: 43 % (ref 12–46)
Lymphs Abs: 3.2 10*3/uL (ref 0.7–4.0)
MCH: 32.6 pg (ref 26.0–34.0)
MCHC: 35 g/dL (ref 30.0–36.0)
MCV: 93.3 fL (ref 78.0–100.0)
Monocytes Absolute: 0.5 10*3/uL (ref 0.1–1.0)
Monocytes Relative: 7 % (ref 3–12)
NEUTROS ABS: 3.6 10*3/uL (ref 1.7–7.7)
NEUTROS PCT: 48 % (ref 43–77)
Platelets: 351 10*3/uL (ref 150–400)
RBC: 4.63 MIL/uL (ref 3.87–5.11)
RDW: 13.3 % (ref 11.5–15.5)
WBC: 7.4 10*3/uL (ref 4.0–10.5)

## 2014-02-11 LAB — I-STAT CG4 LACTIC ACID, ED: LACTIC ACID, VENOUS: 0.69 mmol/L (ref 0.5–2.2)

## 2014-02-11 LAB — HEPATIC FUNCTION PANEL
ALBUMIN: 4.1 g/dL (ref 3.5–5.2)
ALT: 24 U/L (ref 0–35)
AST: 24 U/L (ref 0–37)
Alkaline Phosphatase: 108 U/L (ref 39–117)
BILIRUBIN TOTAL: 0.4 mg/dL (ref 0.3–1.2)
Total Protein: 8.4 g/dL — ABNORMAL HIGH (ref 6.0–8.3)

## 2014-02-11 LAB — LIPASE, BLOOD: LIPASE: 37 U/L (ref 11–59)

## 2014-02-11 MED ORDER — FLUORESCEIN SODIUM 1 MG OP STRP
1.0000 | ORAL_STRIP | Freq: Once | OPHTHALMIC | Status: AC
  Administered 2014-02-11: 1 via OPHTHALMIC
  Filled 2014-02-11: qty 1

## 2014-02-11 MED ORDER — TETRACAINE HCL 0.5 % OP SOLN
2.0000 [drp] | Freq: Once | OPHTHALMIC | Status: AC
  Administered 2014-02-11: 2 [drp] via OPHTHALMIC
  Filled 2014-02-11: qty 2

## 2014-02-11 MED ORDER — ONDANSETRON 4 MG PO TBDP
4.0000 mg | ORAL_TABLET | Freq: Once | ORAL | Status: AC
  Administered 2014-02-11: 4 mg via ORAL
  Filled 2014-02-11: qty 1

## 2014-02-11 MED ORDER — IBUPROFEN 400 MG PO TABS
400.0000 mg | ORAL_TABLET | Freq: Once | ORAL | Status: AC
  Administered 2014-02-11: 400 mg via ORAL
  Filled 2014-02-11: qty 2

## 2014-02-11 MED ORDER — CYCLOPENTOLATE HCL 1 % OP SOLN
1.0000 [drp] | Freq: Once | OPHTHALMIC | Status: AC
  Administered 2014-02-11: 1 [drp] via OPHTHALMIC
  Filled 2014-02-11: qty 2

## 2014-02-11 NOTE — Discharge Instructions (Signed)
Apply the Cyclogyl to the affected eye twice a day.  Please follow-up with the ophthalmologist Dr. Maylene Roes at his office tomorrow at 8:30 AM  Do not hesitate to return to the emergency room for any new, worsening or concerning symptoms.  Please obtain primary care using resource guide below. But the minute you were seen in the emergency room and that they will need to obtain records for further outpatient management.  Netty Starring  Emergency Department Resource Guide 1) Find a Doctor and Pay Out of Pocket Although you won't have to find out who is covered by your insurance plan, it is a good idea to ask around and get recommendations. You will then need to call the office and see if the doctor you have chosen will accept you as a new patient and what types of options they offer for patients who are self-pay. Some doctors offer discounts or will set up payment plans for their patients who do not have insurance, but you will need to ask so you aren't surprised when you get to your appointment.  2) Contact Your Local Health Department Not all health departments have doctors that can see patients for sick visits, but many do, so it is worth a call to see if yours does. If you don't know where your local health department is, you can check in your phone book. The CDC also has a tool to help you locate your state's health department, and many state websites also have listings of all of their local health departments.  3) Find a [facility] If your illness is not likely to be very severe or complicated, you may want to try a walk in clinic. These are popping up all over the country in pharmacies, drugstores, and shopping centers. They're usually staffed by nurse practitioners or physician assistants that have been trained to treat common illnesses and complaints. They're usually fairly quick and inexpensive. However, if you have serious medical issues or chronic medical problems, these are probably not your best  option.  No Primary Care Doctor: - Call Health Connect at  [phone] - they can help you locate a primary care doctor that  accepts your insurance, provides certain services, etc. - Physician Referral Service- [phone]  Chronic Pain Problems: Organization         Address  Phone   Notes  [facility]  [phone] Patients need to be referred by their primary care doctor.   Medication Assistance: Organization         Address  Phone   Notes  [facility] Medication [facility] [place] [place], [place] [address] [phone] --Must be a resident of [facility] -- Must have NO insurance coverage whatsoever (no Medicaid/ Medicare, etc.) -- The pt. MUST have a primary care doctor that directs their care regularly and follows them in the community   MedAssist  [phone]   [employer]  [phone]    Agencies that provide inexpensive medical care: Organization         Address  Phone   Notes  [place]  [phone]   [facility]    [phone]   [facility] [place], [place] [address] [phone]   [place] [address] [place] [phone]   Planned Parenthood    [phone]   [facility]    [phone]  [facility]  [address], [place] Phone:  [phone], Fax:  [phone] Hours of Operation:  9 am - 6 pm, M-F.  Also accepts Medicaid/Medicare and self-pay.  [facility] for [place] [address], [place] Phone: [phone], Fax: [phone]. Hours of Operation:  8:30 am - 5:30 pm, M-F.  Also accepts Medicaid and self-pay.  [facility] [place] [address] [place] Phone: [phone]   [place], [place], [place] [phone], Ext. [phone] Mondays & Thursdays: 7-9 AM.  First 15  patients are seen on a first come, first serve basis.    [place] Providers:  Organization         Address  Phone   Notes  [facility] [address],  [phone] Also accepts self-pay patients.  [facility] [address], [place]  [phone]   [place], [address], [place] [phone]   [facility] [address], [place] [phone]   Lucianne Lei [address], [place]   [phone] Only accepts [place] Access [place] patients after they have their name applied to their card.   Self-Pay (no insurance) in [facility]:  Organization         Address  Phone   Notes  Sickle Cell Patients, [facility] Internal Medicine [place] [phone]   [facility] [place] [phone]   [facility] [place]  [place], [place], [place] [phone]   [facility]/Dr. Osei-Bonsu  [address] [place] or [address], [place] [phone] Phone number for both [place] and [place] locations is the same.  Urgent Medical and [facility] [address], [place] [phone]   [facility] [address], [place] or [address] [phone]   [facility] [address], [place] [phone], phone; [phone], fax Sees patients 1st and 3rd Saturday of every month.  Must not qualify for public or private insurance (i.e. Medicaid, Medicare, [place] Health Choice, Veterans' Benefits)  Household income should be no more than 200% of the poverty level The clinic cannot treat you if you are pregnant or think you are pregnant  Sexually transmitted diseases are not treated at the clinic.    Dental  Care: Organization         Address  Phone  Notes  [facility] Department of [facility] Regino Ramirez [phone] Accepts children up to age 62 who are enrolled in [place] or [place]; pregnant women with a Medicaid card; and children who have applied for Medicaid or [place] Health Choice, but were declined, whose parents can pay a reduced fee at time of service.  [facility] Department of [facility]  [address], [place] [phone] Accepts children up to age 71 who are enrolled in [place] or [place]; pregnant women with a Medicaid card; and children who have applied for Medicaid or [place] Health Choice, but were declined, whose parents can pay a reduced fee at time of service.  [place]  [phone]  [place] [phone] Patients are seen by appointment only. Walk-ins are not accepted. [place] will see patients 26 years of age and older. Monday - Tuesday (8am-5pm) Most Wednesdays (8:30-5pm) $30 per visit, cash only  [facility] Adult Dental Access PROGRAM  [address], [facility] [place] [phone] Patients are seen by appointment only. Walk-ins are not accepted. [place] will see patients 62 years of age and older. One Wednesday Evening (Monthly: Volunteer Based).  $30 per visit, cash only  [place]  [phone] for adults; Children under age 39, call Graduate Pediatric Dentistry at [phone]. Children aged 75-14, please call [phone] to request a pediatric application.  Dental services are provided in all areas of dental care including fillings, crowns and bridges, complete and partial dentures, implants, gum treatment, root canals, and extractions. Preventive care is also provided. Treatment is provided to both adults and children. Patients are selected via a lottery and there is often a waiting list.   [facility] [address] [place]  [phone] [email or web address]   [facility] [address] [place], [place] [phone], Ext. [phone] Second and Fourth Thursday of each month, opens at 6:30 AM; Clinic ends at 9 AM.  Patients are seen on a first-come first-served basis, and a limited number are seen during each clinic.   [facility]  [address] Hillard Danker [place], [place] [phone]   Eligibility Requirements You must have lived in [place], [place], or [place] counties for at least the last three months.   You cannot be eligible for state or federal sponsored [employer], including [employer], [place], or [employer].   You generally cannot be eligible for healthcare insurance through your employer.    How to apply: Eligibility screenings are held every Tuesday and Wednesday afternoon from 1:00 pm until 4:00 pm. You do not need an appointment for the interview!  [facility] [address] [place], [place]   [place]  [place] Department  [place]  [phone]    Behavioral Health Resources in the Community: Intensive Outpatient Programs Organization         Address  Phone  Notes  [place]. [address] [place], [place] [phone]   [facility] Outpatient [address] [place], [place]   ADS: Alcohol [facility] Drug [facility] [address], [place], [place]   [place] [address]  [place], [place] or [phone]   Substance Abuse Resources Organization         Address  Phone  Notes  Alcohol and Drug Services  [phone]   [place]  [phone]   The [place]   Chinita Pester  [phone]   Residential & Outpatient Substance Abuse Program  [phone]    Psychological Services Organization         Address  Phone  Notes  [facility] [place]  [place]  [phone]   [place] [address] [place] or [phone]    Mobile Crisis Teams Organization         Address  Phone  Notes  [facility], Mobile Crisis Care Unit  [phone]   Assertive Psychotherapeutic Services  [address] [place], [place]  [facility] [address], [address] [place] [phone]    Self-Help/Support Groups Organization         Address  Phone             Notes  [facility] [place] - variety of support groups  Jessie Call for more information  Narcotics Anonymous (NA), [facility] [address], [employer]  2 meetings at this location   Special educational needs teacher         Address  Phone  Notes  [facility]AP Residential Treatment [place],    [place]  [phone]   [facility]  [address], [place] [phone], [place], [place]   [place], [place] [phone] Admissions: 8am-3pm M-F  Incentives Substance [place] [address]    [place], [place] [phone]   [facility] [address] [place], [place], [place]   [facility] [address]  [place], [place]   [facility]ight Programs - Intensive Outpatient [address] Kristeen [address], [place], [place]   [facility] [place].,  [place], [place] [phone] or [phone]   [facility]idential Treatment Services (RTS) [address] [place], [place] Accepts Medicaid  [facility] [place] [address]  [place] [phone] Substance Abuse/Addiction Treatment   [facility] Organization         Address  Phone  Notes  [facility]  [phone]   Domenic Schwab, PhD [address] [place], [place]   [phone] or [phone]   [place], [place] [phone]   [facility] [address] [place], [place] [phone] Insurance/Medicaid/sponsorship through [facility] and Families [address], [address] [place]                                    [place], [place] [phone] [place] [address], [place] [phone]    Dr. Adele Schilder  [phone]   [facility] Dept. 1) [address], [place] 2) [place] 3)  [address], [place] [phone]  [phone]   [place] [phone] or [phone] (After Hours)

## 2014-02-11 NOTE — ED Notes (Signed)
Pt c/o URI sx and possible pink eye to left eye x 2 days

## 2014-02-11 NOTE — ED Provider Notes (Signed)
CSN: [id number]     Arrival date & time 02/11/14  1440 History  This chart was scribed for non-physician practitioner, Monico Blitz, PA-C, working with Quintella Reichert, MD, by Delphia Grates, ED Scribe. This patient was seen in room TR04C/TR04C and the patient's care was started at 4:28 PM.    Chief Complaint  Patient presents with  . Eye Pain  . URI    The history is provided by the patient. No language interpreter was used.     HPI Comments: Kathryn Larson is a 43 y.o. female who presents to the Emergency Department complaining of possible left eye conjunctivitis onset yesterday. Patient states that she has not rubbed her eyes and denies any trauma. Patient denies wearing contact lenses, but does wear glasses. There is associated left eye redness, pain, swelling, photophobia, and slight visual disturbance. Patient denies total blindness in the left eye, stating she is able to see clearly at baseline.  Patient also reports she has an URI for the past 4 days. There is associated productive cough of phlegm, and blood, fever (Tmax 103 F), nausea, vomiting, 8/10 abdominal pain, occasional SOB. No sick contacts. Patient reports that her primary concern is her left eye.     Past Medical History  Diagnosis Date  . GERD (gastroesophageal reflux disease) 1994  . Bipolar affective   . Rectal bleed     Hemorrhoids  . Anxiety   . Obesity   . Panic attacks   . Seizures     Seizure free since April/May 2014  . Carpal tunnel syndrome   . Ovarian cyst   . Calculus of gallbladder with acute cholecystitis, without mention of obstruction 03/26/2013  . GERD (gastroesophageal reflux disease) 03/26/2013  . Bipolar disorder, unspecified 03/26/2013  . Obesity, Class III, BMI 40-49.9 (morbid obesity) 03/26/2013   Past Surgical History  Procedure Laterality Date  . Ankle fracture surgery      Bilateral - right 2013, left 2010  . Ectopic pregnancy surgery    . Cholecystectomy N/A 03/24/2013     Procedure: LAPAROSCOPIC CHOLECYSTECTOMY;  Surgeon: [name], MD;  Location: [facility];  Service: General;  Laterality: N/A;   History reviewed. No pertinent family history. History  Substance Use Topics  . Smoking status: Former Smoker    Quit date: 02/27/2010  . Smokeless tobacco: Not on file  . Alcohol Use: No   OB History    Gravida Para Term Preterm AB TAB SAB Ectopic Multiple Living   6 2 2  4  4   2      Review of Systems   A complete 10 system review of systems was obtained and all systems are negative except as noted in the HPI and PMH.     Allergies  Review of patient's allergies indicates no known allergies.  Home Medications   Prior to Admission medications   Medication Sig Start Date End Date Taking? Authorizing Provider  acetaminophen (TYLENOL) 325 MG tablet Take 2 tablets (650 mg total) by mouth every 6 (six) hours as needed for mild pain, moderate pain, fever or headache (Do not take more than 4000 mg of tylenol (acetaminophen per day, it is in your prescribed medicine.)). 03/25/13  Yes Earnstine Regal, PA-C  ALPRAZolam Duanne Moron) 1 MG tablet Take 1 mg by mouth 3 (three) times daily.   Yes Historical Provider, MD  cyclobenzaprine (FLEXERIL) 5 MG tablet Take 5 mg by mouth 3 (three) times daily as needed for muscle spasms.   Yes Historical Provider, MD  Phenyleph-CPM-DM-APAP (TYLENOL COLD MULTI-SYMPTOM) 06-28-08-325 MG TABS Take 2 tablets by mouth daily as needed (for cold).   Yes Historical Provider, MD  sertraline (ZOLOFT) 100 MG tablet Take 100 mg by mouth 3 (three) times daily.   Yes Historical Provider, MD  ibuprofen (ADVIL,MOTRIN) 200 MG tablet You can take 2-3 tablets every 8 hours for pain as needed. 03/25/13   Earnstine Regal, PA-C  oxyCODONE-acetaminophen (PERCOCET/ROXICET) 5-325 MG per tablet Take 1-2 tablets by mouth every 4 (four) hours as needed for moderate pain. 03/25/13   Earnstine Regal, PA-C  sucralfate (CARAFATE) 1 GM/10ML suspension Take 10 mLs (1 g  total) by mouth 4 (four) times daily -  with meals and at bedtime. 03/21/13   Kassie Mends, MD   Triage Vitals: BP 149/101 mmHg  Pulse 92  Temp(Src) 98.1 F (36.7 C) (Oral)  Resp 18  SpO2 98%  Physical Exam  Constitutional: She is oriented to person, place, and time. She appears well-developed and well-nourished. No distress.  HENT:  Head: Normocephalic and atraumatic.  Mouth/Throat: Oropharynx is clear and moist.  Eyes: EOM are normal. Pupils are equal, round, and reactive to light. Left conjunctiva is injected.  Left conjunctival injection and mild swelling to upper eyelid.  Pressure is 17 mmHg in the left eye with confidence interval 95%. Good air movement is intact. Pupils are equal round and reactive to light. She is photophobic and has consensual photophobia. No corneal abrasion on floor seen stain  Neck: Normal range of motion. Neck supple. No tracheal deviation present.  Cardiovascular: Normal rate, regular rhythm and intact distal pulses.   Pulmonary/Chest: Effort normal and breath sounds normal. No respiratory distress. She has no wheezes. She has no rales. She exhibits no tenderness.  Abdominal: Soft. Bowel sounds are normal. She exhibits no distension and no mass. There is no tenderness. There is no rebound and no guarding.  Musculoskeletal: Normal range of motion. She exhibits no edema or tenderness.  Neurological: She is alert and oriented to person, place, and time.  Skin: Skin is warm and dry.  Psychiatric: She has a normal mood and affect. Her behavior is normal.  Nursing note and vitals reviewed.   ED Course  Procedures (including critical care time)  DIAGNOSTIC STUDIES: Oxygen Saturation is 98% on room air, normal by my interpretation.    COORDINATION OF CARE: At 1633 Discussed treatment plan with patient which includes labs. Patient agrees.   Labs Review Labs Reviewed  CBC WITH DIFFERENTIAL - Abnormal; Notable for the following:    Hemoglobin 15.1 (*)     All other components within normal limits  HEPATIC FUNCTION PANEL - Abnormal; Notable for the following:    Total Protein 8.4 (*)    All other components within normal limits  LIPASE, BLOOD  I-STAT CG4 LACTIC ACID, ED    Imaging Review Dg Chest 2 View  02/11/2014   CLINICAL DATA:  Cough.  EXAM: CHEST  2 VIEW  COMPARISON:  None.  FINDINGS: The heart size and mediastinal contours are within normal limits. Both lungs are clear. No pneumothorax or pleural effusion is noted. The visualized skeletal structures are unremarkable.  IMPRESSION: No acute cardiopulmonary abnormality seen.   Electronically Signed   By: Sabino Dick M.D.   On: 02/11/2014 18:12     EKG Interpretation None      MDM   Final diagnoses:  Cough  Acute URI  Conjunctivitis of left eye  Iritis   Filed Vitals:   02/11/14 1512 02/11/14 1910  BP: 149/101 124/87  Pulse: 92 58  Temp: 98.1 F (36.7 C)   TempSrc: Oral   Resp: 18 14  SpO2: 98% 100%    Medications  tetracaine (PONTOCAINE) 0.5 % ophthalmic solution 2 drop (2 drops Left Eye Given 02/11/14 1704)  fluorescein ophthalmic strip 1 strip (1 strip Left Eye Given 02/11/14 1704)  ibuprofen (ADVIL,MOTRIN) tablet 400 mg (400 mg Oral Given 02/11/14 1703)  ondansetron (ZOFRAN-ODT) disintegrating tablet 4 mg (4 mg Oral Given 02/11/14 1703)    Abraham Margulies is a pleasant 43 y.o. female presenting with upper respiratory complaints. Patient reports fever of 103 an 8 out of 10 abdominal pain. Serial abdominal exams are completely benign with no tenderness to palpation, guarding or rebound. Patient is tolerating by mouth. Blood work is reassuring. Chest x-rays without infiltrate. Patient has injected left eye, normal intraocular pressures. She has photophobia consistent with an iritis visual acuity is equal bilaterally at 20/50. Fluorescien stain with no corneal abrasion. Anterior chamber is grossly normal.  Ophthalmology consult from Dr. Maylene Roes appreciated: Confirms that  there are no dendrites on exam. Patient clinical scenario and evaluation is most consistent with a viral conjunctivitis. He would not recommend starting antibiotic drops at this time. He would recommend starting Cyclogyl 1% twice a day. He will see the patient at 8:30 AM in his office tomorrow.  Evaluation does not show pathology that would require ongoing emergent intervention or inpatient treatment. Pt is hemodynamically stable and mentating appropriately. Discussed findings and plan with patient/guardian, who agrees with care plan. All questions answered. Return precautions discussed and outpatient follow up given.   I personally performed the services described in this documentation, which was scribed in my presence. The recorded information has been reviewed and is accurate.    Monico Blitz, PA-C 02/11/14 2117  Quintella Reichert, MD 02/11/14 2239

## 2014-05-16 ENCOUNTER — Emergency Department (HOSPITAL_BASED_OUTPATIENT_CLINIC_OR_DEPARTMENT_OTHER): Payer: Medicaid Other

## 2014-05-16 ENCOUNTER — Emergency Department (HOSPITAL_BASED_OUTPATIENT_CLINIC_OR_DEPARTMENT_OTHER)
Admission: EM | Admit: 2014-05-16 | Discharge: 2014-05-16 | Disposition: A | Discharge status: Home / Self Care | Payer: Medicaid Other | Attending: Emergency Medicine | Admitting: Emergency Medicine

## 2014-05-16 ENCOUNTER — Encounter (HOSPITAL_BASED_OUTPATIENT_CLINIC_OR_DEPARTMENT_OTHER): Payer: Self-pay | Admitting: *Deleted

## 2014-05-16 DIAGNOSIS — Z79899 Other long term (current) drug therapy: Secondary | ICD-10-CM | POA: Diagnosis not present

## 2014-05-16 DIAGNOSIS — K219 Gastro-esophageal reflux disease without esophagitis: Secondary | ICD-10-CM | POA: Insufficient documentation

## 2014-05-16 DIAGNOSIS — F319 Bipolar disorder, unspecified: Secondary | ICD-10-CM | POA: Diagnosis not present

## 2014-05-16 DIAGNOSIS — R0789 Other chest pain: Secondary | ICD-10-CM | POA: Diagnosis not present

## 2014-05-16 DIAGNOSIS — N858 Other specified noninflammatory disorders of uterus: Secondary | ICD-10-CM | POA: Diagnosis not present

## 2014-05-16 DIAGNOSIS — Z3202 Encounter for pregnancy test, result negative: Secondary | ICD-10-CM | POA: Diagnosis not present

## 2014-05-16 DIAGNOSIS — A5901 Trichomonal vulvovaginitis: Secondary | ICD-10-CM | POA: Insufficient documentation

## 2014-05-16 DIAGNOSIS — Z87891 Personal history of nicotine dependence: Secondary | ICD-10-CM | POA: Insufficient documentation

## 2014-05-16 DIAGNOSIS — F419 Anxiety disorder, unspecified: Secondary | ICD-10-CM | POA: Diagnosis not present

## 2014-05-16 DIAGNOSIS — N949 Unspecified condition associated with female genital organs and menstrual cycle: Secondary | ICD-10-CM

## 2014-05-16 DIAGNOSIS — N859 Noninflammatory disorder of uterus, unspecified: Secondary | ICD-10-CM

## 2014-05-16 DIAGNOSIS — Z8669 Personal history of other diseases of the nervous system and sense organs: Secondary | ICD-10-CM | POA: Diagnosis not present

## 2014-05-16 DIAGNOSIS — R102 Pelvic and perineal pain: Secondary | ICD-10-CM | POA: Diagnosis present

## 2014-05-16 DIAGNOSIS — N760 Acute vaginitis: Secondary | ICD-10-CM

## 2014-05-16 DIAGNOSIS — B9689 Other specified bacterial agents as the cause of diseases classified elsewhere: Secondary | ICD-10-CM

## 2014-05-16 LAB — BASIC METABOLIC PANEL
Anion gap: 10 (ref 5–15)
BUN: 9 mg/dL (ref 6–23)
CO2: 20 mmol/L (ref 19–32)
CREATININE: 0.78 mg/dL (ref 0.50–1.10)
Calcium: 9.3 mg/dL (ref 8.4–10.5)
Chloride: 107 mmol/L (ref 96–112)
GFR calc Af Amer: >90 mL/min (ref >90)
GFR calc non Af Amer: >90 mL/min (ref >90)
Glucose, Bld: 82 mg/dL (ref 70–99)
Potassium: 3.9 mmol/L (ref 3.5–5.1)
Sodium: 137 mmol/L (ref 135–145)

## 2014-05-16 LAB — CBC WITH DIFFERENTIAL/PLATELET
Basophils Absolute: 0 10*3/uL (ref 0.0–0.1)
Basophils Relative: 0 % (ref 0–1)
EOS PCT: 2 % (ref 0–5)
Eosinophils Absolute: 0.2 10*3/uL (ref 0.0–0.7)
HCT: 43.1 % (ref 36.0–46.0)
Hemoglobin: 14.7 g/dL (ref 12.0–15.0)
Lymphocytes Relative: 42 % (ref 12–46)
Lymphs Abs: 4.1 10*3/uL — ABNORMAL HIGH (ref 0.7–4.0)
MCH: 32 pg (ref 26.0–34.0)
MCHC: 34.1 g/dL (ref 30.0–36.0)
MCV: 93.7 fL (ref 78.0–100.0)
MONOS PCT: 8 % (ref 3–12)
Monocytes Absolute: 0.8 10*3/uL (ref 0.1–1.0)
NEUTROS PCT: 48 % (ref 43–77)
Neutro Abs: 4.6 10*3/uL (ref 1.7–7.7)
Platelets: 318 10*3/uL (ref 150–400)
RBC: 4.6 MIL/uL (ref 3.87–5.11)
RDW: 14.2 % (ref 11.5–15.5)
WBC: 9.7 10*3/uL (ref 4.0–10.5)

## 2014-05-16 LAB — URINALYSIS, ROUTINE W REFLEX MICROSCOPIC
Bilirubin Urine: NEGATIVE
Glucose, UA: NEGATIVE mg/dL
Hgb urine dipstick: NEGATIVE
KETONES UR: NEGATIVE mg/dL
LEUKOCYTES UA: NEGATIVE
NITRITE: NEGATIVE
PROTEIN: NEGATIVE mg/dL
SPECIFIC GRAVITY, URINE: 1.021 (ref 1.005–1.030)
Urobilinogen, UA: 0.2 mg/dL (ref 0.0–1.0)
pH: 5.5 (ref 5.0–8.0)

## 2014-05-16 LAB — WET PREP, GENITAL

## 2014-05-16 LAB — PREGNANCY, URINE: Preg Test, Ur: NEGATIVE

## 2014-05-16 LAB — TROPONIN I: Troponin I: <0.03 ng/mL (ref <0.031)

## 2014-05-16 MED ORDER — NAPROXEN 500 MG PO TABS
500.0000 mg | ORAL_TABLET | Freq: Two times a day (BID) | ORAL | Status: DC
Start: 2014-05-16 — End: 2015-04-27

## 2014-05-16 MED ORDER — FLUCONAZOLE 50 MG PO TABS
150.0000 mg | ORAL_TABLET | Freq: Once | ORAL | Status: AC
  Administered 2014-05-16: 150 mg via ORAL
  Filled 2014-05-16 (×2): qty 1

## 2014-05-16 MED ORDER — METRONIDAZOLE 500 MG PO TABS
2000.0000 mg | ORAL_TABLET | Freq: Once | ORAL | Status: AC
  Administered 2014-05-16: 2000 mg via ORAL
  Filled 2014-05-16: qty 4

## 2014-05-16 NOTE — ED Provider Notes (Signed)
CSN: [id number]     Arrival date & time 05/16/14  1505 History  This chart was scribed for No att. providers found by Edison Simon, ED Scribe. This patient was seen in room MH01/MH01 and the patient's care was started at 5:13 PM.    Chief Complaint  Patient presents with  . Pelvic Pain   The history is provided by the patient. No language interpreter was used.    HPI Comments: Kathryn Larson is a 44 y.o. female with history of ovarian cysts and ectopic pregnancy who presents to the Emergency Department complaining of pelvic cramping and vaginal spotting. She states she was expecting her period on March 15 but instead had it begin on March 2; she states it lasted 1.5 days, and has been coming and going since then. Her periods normally last 4 days. She states she has had cramping intermittently but every day since March 2. She states it is worse after bathing, holding in urine, and intercourse. She states she goes through 4-5 pads a day. Her last normal menstrual period was February 15. She notes associated breast tenderness. She reports associated nausea but states vomiting only produces foam. She states she is using Tylenol and Advil without remission. Ovarian cysts were diagnosed by Dr. Ramonita Lab in [place], [place] whom she has not seen in over 1 year. She does not have an OBGYN here. She states she has been pregnant a few times but has always miscarried. She states she still has both ovaries. She reports prior cholecystectomy. She denies fever.  She states she has also had right-sided, intermittent chest pain radiating to right arm with associated SOB lasting 20 minutes at a time with onset 3-4 months ago. She states she has this often but not every day, more frequent when she stressed. She uses OTC medications for it which sometimes improves it and sometimes does not. She denies personal history of cardiac problems but has FHx of cardiac problems. She states she has not been evaluated for this.    Past Medical History  Diagnosis Date  . GERD (gastroesophageal reflux disease) 1994  . Bipolar affective   . Rectal bleed     Hemorrhoids  . Anxiety   . Obesity   . Panic attacks   . Carpal tunnel syndrome   . Ovarian cyst   . Calculus of gallbladder with acute cholecystitis, without mention of obstruction 03/26/2013  . GERD (gastroesophageal reflux disease) 03/26/2013  . Bipolar disorder, unspecified 03/26/2013  . Obesity, Class III, BMI 40-49.9 (morbid obesity) 03/26/2013  . Seizures     last seizure in May 2015   Past Surgical History  Procedure Laterality Date  . Ankle fracture surgery      Bilateral - right 2013, left 2010  . Ectopic pregnancy surgery    . Cholecystectomy N/A 03/24/2013    Procedure: LAPAROSCOPIC CHOLECYSTECTOMY;  Surgeon: [name], MD;  Location: [facility];  Service: General;  Laterality: N/A;   No family history on file. History  Substance Use Topics  . Smoking status: Former Smoker    Quit date: 02/27/2010  . Smokeless tobacco: Not on file  . Alcohol Use: No   OB History    Gravida Para Term Preterm AB TAB SAB Ectopic Multiple Living   6 2 2  4  4   2      Review of Systems A complete 10 system review of systems was obtained and all systems are negative except as noted in the HPI and PMH.  Allergies  Review of patient's allergies indicates no known allergies.  Home Medications   Prior to Admission medications   Medication Sig Start Date End Date Taking? Authorizing Provider  ALPRAZolam Duanne Moron) 1 MG tablet Take 1 mg by mouth 3 (three) times daily.   Yes Historical Provider, MD  cyclobenzaprine (FLEXERIL) 5 MG tablet Take 5 mg by mouth 3 (three) times daily as needed for muscle spasms.   Yes Historical Provider, MD  ibuprofen (ADVIL,MOTRIN) 200 MG tablet You can take 2-3 tablets every 8 hours for pain as needed. 03/25/13  Yes Earnstine Regal, PA-C  Phenyleph-CPM-DM-APAP (TYLENOL COLD MULTI-SYMPTOM) 06-28-08-325 MG TABS Take 2 tablets by mouth  daily as needed (for cold).   Yes Historical Provider, MD  sertraline (ZOLOFT) 100 MG tablet Take 100 mg by mouth 3 (three) times daily.   Yes Historical Provider, MD  sucralfate (CARAFATE) 1 GM/10ML suspension Take 10 mLs (1 g total) by mouth 4 (four) times daily -  with meals and at bedtime. 03/21/13  Yes Kassie Mends, MD  acetaminophen (TYLENOL) 325 MG tablet Take 2 tablets (650 mg total) by mouth every 6 (six) hours as needed for mild pain, moderate pain, fever or headache (Do not take more than 4000 mg of tylenol (acetaminophen per day, it is in your prescribed medicine.)). 03/25/13   Earnstine Regal, PA-C  naproxen (NAPROSYN) 500 MG tablet Take 1 tablet (500 mg total) by mouth 2 (two) times daily. 05/16/14   Ezequiel Essex, MD  oxyCODONE-acetaminophen (PERCOCET/ROXICET) 5-325 MG per tablet Take 1-2 tablets by mouth every 4 (four) hours as needed for moderate pain. 03/25/13   Earnstine Regal, PA-C   BP 107/57 mmHg  Pulse 72  Temp(Src) 99 F (37.2 C) (Oral)  Resp 18  Ht 5' 4"  (1.626 m)  Wt 225 lb (102.059 kg)  BMI 38.60 kg/m2  SpO2 99%  LMP 04/12/2014 Physical Exam  Constitutional: She is oriented to person, place, and time. She appears well-developed and well-nourished. No distress.  Morbidly obese   HENT:  Head: Normocephalic and atraumatic.  Mouth/Throat: Oropharynx is clear and moist. No oropharyngeal exudate.  Eyes: Conjunctivae and EOM are normal. Pupils are equal, round, and reactive to light.  Neck: Normal range of motion. Neck supple.  No meningismus.  Cardiovascular: Normal rate, regular rhythm, normal heart sounds and intact distal pulses.   No murmur heard. Pulmonary/Chest: Effort normal and breath sounds normal. No respiratory distress. She exhibits tenderness (Right-sided chest wall tenderness).  Abdominal: Soft. There is tenderness (Suprapubic and LLQ pain). There is no rebound and no guarding.  No CVA tenderness  Genitourinary: No vaginal discharge found.  Normal  external genitalia No discharge No CMT Left adnexal tederness Chaperone present  Musculoskeletal: Normal range of motion. She exhibits no edema or tenderness.  Neurological: She is alert and oriented to person, place, and time. No cranial nerve deficit. She exhibits normal muscle tone. Coordination normal.  No ataxia on finger to nose bilaterally. No pronator drift. 5/5 strength throughout. CN 2-12 intact. Negative Romberg. Equal grip strength. Sensation intact. Gait is normal.   Skin: Skin is warm.  Psychiatric: She has a normal mood and affect. Her behavior is normal.  Nursing note and vitals reviewed.   ED Course  Procedures (including critical care time)  .DIAGNOSTIC STUDIES: Oxygen Saturation is 99% on room air, normal by my interpretation.    COORDINATION OF CARE: 5:23 PM Discussed treatment plan with patient at beside, the patient agrees with the plan and has no further  questions at this time.   Labs Review Labs Reviewed  WET PREP, GENITAL - Abnormal; Notable for the following:    Yeast Wet Prep HPF POC FEW (*)    Trich, Wet Prep FEW (*)    Clue Cells Wet Prep HPF POC FEW (*)    WBC, Wet Prep HPF POC MANY (*)    All other components within normal limits  CBC WITH DIFFERENTIAL/PLATELET - Abnormal; Notable for the following:    Lymphs Abs 4.1 (*)    All other components within normal limits  PREGNANCY, URINE  URINALYSIS, ROUTINE W REFLEX MICROSCOPIC  BASIC METABOLIC PANEL  TROPONIN I  GC/CHLAMYDIA PROBE AMP ([place])    Imaging Review Dg Chest 2 View  05/16/2014   CLINICAL DATA:  Pt states she has also had right-sided, intermittent chest pain radiating to right arm with associated SOB lasting 20 minutes at a time with onset 3-4 months ago. She states she has this often but not every day, more frequent when she stressed. Hx smoker x15 years but quit 2 yrs ago.  EXAM: CHEST  2 VIEW  COMPARISON:  02/11/2014  FINDINGS: Heart, mediastinum hila are within normal limits.  Lungs are clear. No pleural effusion or pneumothorax.  Bony thorax is unremarkable.  IMPRESSION: No active cardiopulmonary disease.   Electronically Signed   By: Lajean Manes M.D.   On: 05/16/2014 18:16   US Transvaginal Non-ob  05/16/2014   CLINICAL DATA:  44 year old female with chronic bilateral pelvic pain. Negative pregnancy test.  EXAM: TRANSABDOMINAL AND TRANSVAGINAL ULTRASOUND OF PELVIS  DOPPLER ULTRASOUND OF OVARIES  TECHNIQUE: Both transabdominal and transvaginal ultrasound examinations of the pelvis were performed. Transabdominal technique was performed for global imaging of the pelvis including uterus, ovaries, adnexal regions, and pelvic cul-de-sac.  It was necessary to proceed with endovaginal exam following the transabdominal exam to visualize the ovaries and endometrium. Color and duplex Doppler ultrasound was utilized to evaluate blood flow to the ovaries.  COMPARISON:  None.  FINDINGS: Uterus  Measurements: 8.3 x 4 x 5.1 cm. Uterine echotexture is mildly heterogeneous. There is no evidence of focal mass or fibroid.  Endometrium  Thickness: 9 mm. A focal 9 x 7 x 10 mm hyperechoic endometrial lesion is identified all on the right.  Right ovary  Measurements: 3.9 x 1.9 x 2.6 cm. Normal appearance/no adnexal mass.  Left ovary  Measurements: 3.7 x 1.7 x 1.7 cm. Normal appearance/no adnexal mass.  Pulsed Doppler evaluation of both ovaries demonstrates normal low-resistance arterial and venous waveforms.  Other findings  No free fluid.  IMPRESSION: 9 x 7 x 10 mm focal hyperechoic endometrial area - a focal endometrial lesion/polyp is suspected. Consider sonohysterogram for further evaluation, prior to hysteroscopy or endometrial biopsy.  Unremarkable ovaries.  No evidence of ovarian torsion.   Electronically Signed   By: Margarette Canada M.D.   On: 05/16/2014 18:40   US Pelvis Complete  05/16/2014   CLINICAL DATA:  44 year old female with chronic bilateral pelvic pain. Negative pregnancy test.  EXAM:  TRANSABDOMINAL AND TRANSVAGINAL ULTRASOUND OF PELVIS  DOPPLER ULTRASOUND OF OVARIES  TECHNIQUE: Both transabdominal and transvaginal ultrasound examinations of the pelvis were performed. Transabdominal technique was performed for global imaging of the pelvis including uterus, ovaries, adnexal regions, and pelvic cul-de-sac.  It was necessary to proceed with endovaginal exam following the transabdominal exam to visualize the ovaries and endometrium. Color and duplex Doppler ultrasound was utilized to evaluate blood flow to the ovaries.  COMPARISON:  None.  FINDINGS: Uterus  Measurements: 8.3 x 4 x 5.1 cm. Uterine echotexture is mildly heterogeneous. There is no evidence of focal mass or fibroid.  Endometrium  Thickness: 9 mm. A focal 9 x 7 x 10 mm hyperechoic endometrial lesion is identified all on the right.  Right ovary  Measurements: 3.9 x 1.9 x 2.6 cm. Normal appearance/no adnexal mass.  Left ovary  Measurements: 3.7 x 1.7 x 1.7 cm. Normal appearance/no adnexal mass.  Pulsed Doppler evaluation of both ovaries demonstrates normal low-resistance arterial and venous waveforms.  Other findings  No free fluid.  IMPRESSION: 9 x 7 x 10 mm focal hyperechoic endometrial area - a focal endometrial lesion/polyp is suspected. Consider sonohysterogram for further evaluation, prior to hysteroscopy or endometrial biopsy.  Unremarkable ovaries.  No evidence of ovarian torsion.   Electronically Signed   By: Margarette Canada M.D.   On: 05/16/2014 18:40   [place] Art/ven Flow Abd Pelv Doppler  05/16/2014   CLINICAL DATA:  44 year old female with chronic bilateral pelvic pain. Negative pregnancy test.  EXAM: TRANSABDOMINAL AND TRANSVAGINAL ULTRASOUND OF PELVIS  DOPPLER ULTRASOUND OF OVARIES  TECHNIQUE: Both transabdominal and transvaginal ultrasound examinations of the pelvis were performed. Transabdominal technique was performed for global imaging of the pelvis including uterus, ovaries, adnexal regions, and pelvic cul-de-sac.  It was  necessary to proceed with endovaginal exam following the transabdominal exam to visualize the ovaries and endometrium. Color and duplex Doppler ultrasound was utilized to evaluate blood flow to the ovaries.  COMPARISON:  None.  FINDINGS: Uterus  Measurements: 8.3 x 4 x 5.1 cm. Uterine echotexture is mildly heterogeneous. There is no evidence of focal mass or fibroid.  Endometrium  Thickness: 9 mm. A focal 9 x 7 x 10 mm hyperechoic endometrial lesion is identified all on the right.  Right ovary  Measurements: 3.9 x 1.9 x 2.6 cm. Normal appearance/no adnexal mass.  Left ovary  Measurements: 3.7 x 1.7 x 1.7 cm. Normal appearance/no adnexal mass.  Pulsed Doppler evaluation of both ovaries demonstrates normal low-resistance arterial and venous waveforms.  Other findings  No free fluid.  IMPRESSION: 9 x 7 x 10 mm focal hyperechoic endometrial area - a focal endometrial lesion/polyp is suspected. Consider sonohysterogram for further evaluation, prior to hysteroscopy or endometrial biopsy.  Unremarkable ovaries.  No evidence of ovarian torsion.   Electronically Signed   By: Margarette Canada M.D.   On: 05/16/2014 18:40     EKG Interpretation   Date/Time:  Saturday May 16 2014 17:26:44 EDT Ventricular Rate:  66 PR Interval:  162 QRS Duration: 82 QT Interval:  398 QTC Calculation: 417 R Axis:   50 Text Interpretation:  Normal sinus rhythm Normal ECG No previous ECGs  available Confirmed by Wyvonnia Dusky  MD, Eustolia Drennen [phone]) on 05/16/2014 5:28:49  PM      MDM   Final diagnoses:  Pelvic pain in female  Endometrial disorder  Trichomonas vaginitis  Bacterial vaginosis   Pelvic pain with intermittent vaginal bleeding since March 2. She states she is bleeding 4-5 pads per day. Denies dizziness or lightheadedness. Complains of intermittent right-sided chest pain for the past 4 months that lasts several minutes at a time. It is worse with palpation reproducible. EKG normal sinus rhythm.  Abdomen is soft without  peritoneal signs. Pelvic exam is benign with mild left adnexal tenderness. Urinalysis is negative. Pregnancy test negative  Hemoglobin stable. Wet prep positive for BV, Trichomonas, yeast patient will be treated with Flagyl and diflucan.  Ultrasound results discussed with  patient. She needs further evaluation by gynecology. Discussed with Dr. Roselie Awkward who states patient can make an appointment at [facility].  Patient advised that her sexual partner should be treated for Trichomonas. We'll also treat yeast infection and BV. Urinalysis is negative.  Her chest pain is atypical for ACS. EKG is normal. Troponin is normal. Pain is reproducible.  I personally performed the services described in this documentation, which was scribed in my presence. The recorded information has been reviewed and is accurate.   Ezequiel Essex, MD 05/16/14 [phone]

## 2014-05-16 NOTE — Discharge Instructions (Signed)
Pelvic Pain Follow-up with a gynecologist for further testing of your endometrium. You were treated for Trichomonas and bacterial vaginosis and yeast infection today. your sexual partner should be treated as well. Return to the ED if you developed new or worsening symptoms. Female pelvic pain can be caused by many different things and start from a variety of places. Pelvic pain refers to pain that is located in the lower half of the abdomen and between your hips. The pain may occur over a short period of time (acute) or may be reoccurring (chronic). The cause of pelvic pain may be related to disorders affecting the female reproductive organs (gynecologic), but it may also be related to the bladder, kidney stones, an intestinal complication, or muscle or skeletal problems. Getting help right away for pelvic pain is important, especially if there has been severe, sharp, or a sudden onset of unusual pain. It is also important to get help right away because some types of pelvic pain can be life threatening.  CAUSES  Below are only some of the causes of pelvic pain. The causes of pelvic pain can be in one of several categories.   Gynecologic.  Pelvic inflammatory disease.  Sexually transmitted infection.  Ovarian cyst or a twisted ovarian ligament (ovarian torsion).  Uterine lining that grows outside the uterus (endometriosis).  Fibroids, cysts, or tumors.  Ovulation.  Pregnancy.  Pregnancy that occurs outside the uterus (ectopic pregnancy).  Miscarriage.  Labor.  Abruption of the placenta or ruptured uterus.  Infection.  Uterine infection (endometritis).  Bladder infection.  Diverticulitis.  Miscarriage related to a uterine infection (septic abortion).  Bladder.  Inflammation of the bladder (cystitis).  Kidney stone(s).  Gastrointestinal.  Constipation.  Diverticulitis.  Neurologic.  Trauma.  Feeling pelvic pain because of mental or emotional causes  (psychosomatic).  Cancers of the bowel or pelvis. EVALUATION  Your caregiver will want to take a careful history of your concerns. This includes recent changes in your health, a careful gynecologic history of your periods (menses), and a sexual history. Obtaining your family history and medical history is also important. Your caregiver may suggest a pelvic exam. A pelvic exam will help identify the location and severity of the pain. It also helps in the evaluation of which organ system may be involved. In order to identify the cause of the pelvic pain and be properly treated, your caregiver may order tests. These tests may include:   A pregnancy test.  Pelvic ultrasonography.  An X-ray exam of the abdomen.  A urinalysis or evaluation of vaginal discharge.  Blood tests. HOME CARE INSTRUCTIONS   Only take over-the-counter or prescription medicines for pain, discomfort, or fever as directed by your caregiver.   Rest as directed by your caregiver.   Eat a balanced diet.   Drink enough fluids to make your urine clear or pale yellow, or as directed.   Avoid sexual intercourse if it causes pain.   Apply warm or cold compresses to the lower abdomen depending on which one helps the pain.   Avoid stressful situations.   Keep a journal of your pelvic pain. Write down when it started, where the pain is located, and if there are things that seem to be associated with the pain, such as food or your menstrual cycle.  Follow up with your caregiver as directed.  SEEK MEDICAL CARE IF:  Your medicine does not help your pain.  You have abnormal vaginal discharge. SEEK IMMEDIATE MEDICAL CARE IF:   You have  heavy bleeding from the vagina.   Your pelvic pain increases.   You feel light-headed or faint.   You have chills.   You have pain with urination or blood in your urine.   You have uncontrolled diarrhea or vomiting.   You have a fever or persistent symptoms for more  than 3 days.  You have a fever and your symptoms suddenly get worse.   You are being physically or sexually abused.  MAKE SURE YOU:  Understand these instructions.  Will watch your condition.  Will get help if you are not doing well or get worse. Document Released: 01/11/2004 Document Revised: 06/30/2013 Document Reviewed: 06/05/2011 [facility] Patient Information 2015 [place], [place]. This information is not intended to replace advice given to you by your health care provider. Make sure you discuss any questions you have with your health care provider.

## 2014-05-16 NOTE — ED Notes (Signed)
Pt reports pelvic pain and intermittent vaginal bleeding since her menstrual cycle ended in February

## 2014-05-19 LAB — GC/CHLAMYDIA PROBE AMP (~~LOC~~) NOT AT ARMC
CHLAMYDIA, DNA PROBE: NEGATIVE
NEISSERIA GONORRHEA: NEGATIVE

## 2014-08-10 ENCOUNTER — Encounter (HOSPITAL_COMMUNITY): Payer: Self-pay | Admitting: Emergency Medicine

## 2014-08-10 ENCOUNTER — Emergency Department (HOSPITAL_COMMUNITY)
Admission: EM | Admit: 2014-08-10 | Discharge: 2014-08-10 | Disposition: A | Discharge status: Home / Self Care | Payer: Medicaid Other | Attending: Emergency Medicine | Admitting: Emergency Medicine

## 2014-08-10 ENCOUNTER — Emergency Department (HOSPITAL_COMMUNITY): Payer: Medicaid Other

## 2014-08-10 DIAGNOSIS — Z79899 Other long term (current) drug therapy: Secondary | ICD-10-CM | POA: Diagnosis not present

## 2014-08-10 DIAGNOSIS — Y9234 Swimming pool (public) as the place of occurrence of the external cause: Secondary | ICD-10-CM | POA: Diagnosis not present

## 2014-08-10 DIAGNOSIS — Z87891 Personal history of nicotine dependence: Secondary | ICD-10-CM | POA: Insufficient documentation

## 2014-08-10 DIAGNOSIS — F319 Bipolar disorder, unspecified: Secondary | ICD-10-CM | POA: Insufficient documentation

## 2014-08-10 DIAGNOSIS — G40909 Epilepsy, unspecified, not intractable, without status epilepticus: Secondary | ICD-10-CM | POA: Insufficient documentation

## 2014-08-10 DIAGNOSIS — Z791 Long term (current) use of non-steroidal anti-inflammatories (NSAID): Secondary | ICD-10-CM | POA: Diagnosis not present

## 2014-08-10 DIAGNOSIS — W1839XA Other fall on same level, initial encounter: Secondary | ICD-10-CM | POA: Insufficient documentation

## 2014-08-10 DIAGNOSIS — Z8781 Personal history of (healed) traumatic fracture: Secondary | ICD-10-CM | POA: Insufficient documentation

## 2014-08-10 DIAGNOSIS — F410 Panic disorder [episodic paroxysmal anxiety] without agoraphobia: Secondary | ICD-10-CM | POA: Diagnosis not present

## 2014-08-10 DIAGNOSIS — Y998 Other external cause status: Secondary | ICD-10-CM | POA: Diagnosis not present

## 2014-08-10 DIAGNOSIS — Y9389 Activity, other specified: Secondary | ICD-10-CM | POA: Diagnosis not present

## 2014-08-10 DIAGNOSIS — Z9889 Other specified postprocedural states: Secondary | ICD-10-CM | POA: Diagnosis not present

## 2014-08-10 DIAGNOSIS — K219 Gastro-esophageal reflux disease without esophagitis: Secondary | ICD-10-CM | POA: Diagnosis not present

## 2014-08-10 DIAGNOSIS — Z8742 Personal history of other diseases of the female genital tract: Secondary | ICD-10-CM | POA: Insufficient documentation

## 2014-08-10 DIAGNOSIS — S99911A Unspecified injury of right ankle, initial encounter: Secondary | ICD-10-CM | POA: Insufficient documentation

## 2014-08-10 MED ORDER — KETOROLAC TROMETHAMINE 60 MG/2ML IM SOLN
60.0000 mg | Freq: Once | INTRAMUSCULAR | Status: AC
  Administered 2014-08-10: 60 mg via INTRAMUSCULAR
  Filled 2014-08-10: qty 2

## 2014-08-10 NOTE — ED Notes (Signed)
Pt c/o ankle injury to right side. Pt states body fell on self. Ankle is already broken and ankle may have gotten twisted in process.

## 2014-08-10 NOTE — ED Provider Notes (Signed)
CSN: [id number]     Arrival date & time 08/10/14  1140 History  This chart was scribed for non-physician practitioner, Al Corpus, PA-C working with Orpah Greek, MD by Rayna Sexton, ED scribe. This patient was seen in room WTR5/WTR5 and the patient's care was started at 12:45 PM.    Chief Complaint  Patient presents with  . Ankle Injury    The history is provided by the patient. No language interpreter was used.    HPI Comments: Kathryn Larson is a 44 y.o. female, with a history of right ankle injury 1 year ago requiring surgery, who presents to the Emergency Department complaining of constant, moderate, right ankle pain with onset yesterday after fall. Pt notes an emergency situation that occurred yesterday and involved the patient helping pull her husband out of a swimming pool which resulted in him landing on the effected ankle. She denies any radiation of pain but does note an open fracture of the affected ankle 1 year ago which was surgically corrected. Pt denies taking any medication PTA. She additionally denies LOC or any additional associated symptoms.    Past Medical History  Diagnosis Date  . GERD (gastroesophageal reflux disease) 1994  . Bipolar affective   . Rectal bleed     Hemorrhoids  . Anxiety   . Obesity   . Panic attacks   . Carpal tunnel syndrome   . Ovarian cyst   . Calculus of gallbladder with acute cholecystitis, without mention of obstruction 03/26/2013  . GERD (gastroesophageal reflux disease) 03/26/2013  . Bipolar disorder, unspecified 03/26/2013  . Obesity, Class III, BMI 40-49.9 (morbid obesity) 03/26/2013  . Seizures     last seizure in May 2015   Past Surgical History  Procedure Laterality Date  . Ankle fracture surgery      Bilateral - right 2013, left 2010  . Ectopic pregnancy surgery    . Cholecystectomy N/A 03/24/2013    Procedure: LAPAROSCOPIC CHOLECYSTECTOMY;  Surgeon: [name], MD;  Location: [place];  Service: General;   Laterality: N/A;   History reviewed. No pertinent family history. History  Substance Use Topics  . Smoking status: Former Smoker    Quit date: 02/27/2010  . Smokeless tobacco: Not on file  . Alcohol Use: No   OB History    Gravida Para Term Preterm AB TAB SAB Ectopic Multiple Living   6 2 2  4  4   2      Review of Systems  Constitutional: Negative for fever and fatigue.  Musculoskeletal: Positive for myalgias, joint swelling and arthralgias.  Neurological: Negative for dizziness and syncope.      Allergies  Review of patient's allergies indicates no known allergies.  Home Medications   Prior to Admission medications   Medication Sig Start Date End Date Taking? Authorizing Provider  acetaminophen (TYLENOL) 325 MG tablet Take 2 tablets (650 mg total) by mouth every 6 (six) hours as needed for mild pain, moderate pain, fever or headache (Do not take more than 4000 mg of tylenol (acetaminophen per day, it is in your prescribed medicine.)). 03/25/13  Yes Earnstine Regal, PA-C  ALPRAZolam Duanne Moron) 1 MG tablet Take 1 mg by mouth 3 (three) times daily.   Yes Historical Provider, MD  cyclobenzaprine (FLEXERIL) 5 MG tablet Take 5 mg by mouth 3 (three) times daily as needed for muscle spasms.   Yes Historical Provider, MD  ibuprofen (ADVIL,MOTRIN) 200 MG tablet You can take 2-3 tablets every 8 hours for pain as needed.  03/25/13  Yes Earnstine Regal, PA-C  naproxen (NAPROSYN) 500 MG tablet Take 1 tablet (500 mg total) by mouth 2 (two) times daily. 05/16/14  Yes Ezequiel Essex, MD  Phenyleph-CPM-DM-APAP (TYLENOL COLD MULTI-SYMPTOM) 06-28-08-325 MG TABS Take 2 tablets by mouth daily as needed (for cold).   Yes Historical Provider, MD  sertraline (ZOLOFT) 100 MG tablet Take 100 mg by mouth 3 (three) times daily.   Yes Historical Provider, MD  sucralfate (CARAFATE) 1 GM/10ML suspension Take 10 mLs (1 g total) by mouth 4 (four) times daily -  with meals and at bedtime. 03/21/13  Yes Kassie Mends, MD    BP 142/98 mmHg  Pulse 52  Temp(Src) 98 F (36.7 C) (Oral)  Resp 20  SpO2 100%  LMP 07/30/2014 (Exact Date) Physical Exam  Constitutional: She appears well-developed and well-nourished. No distress.  HENT:  Head: Normocephalic and atraumatic.  Eyes: Conjunctivae are normal. Right eye exhibits no discharge. Left eye exhibits no discharge.  Cardiovascular: Normal rate, regular rhythm, normal heart sounds and intact distal pulses.   2+ DP and PT pulses equal bilaterally  Pulmonary/Chest: Effort normal. No respiratory distress.  Musculoskeletal: She exhibits edema and tenderness.  Dorsal swelling of right foot proximal to phalanges Tenderness to right ankle joint line Mild swelling of surrounding right lateral malleolus Achilles tendon intact No proximal fibular head No calf tenderness or swelling    Neurological: She is alert. Coordination normal.  Skin: She is not diaphoretic.  Psychiatric: She has a normal mood and affect. Her behavior is normal.  Nursing note and vitals reviewed.   ED Course  Procedures  DIAGNOSTIC STUDIES: Oxygen Saturation is 100% on RA, normal by my interpretation.    COORDINATION OF CARE: 12:50 PM Discussed treatment plan with pt at bedside and pt agreed to plan.  Labs Review Labs Reviewed - No data to display  Imaging Review Dg Ankle Complete Right  08/10/2014   CLINICAL DATA:  Anterior and lateral right ankle pain, somebody fell onto her right ankle yesterday  EXAM: RIGHT ANKLE - COMPLETE 3+ VIEW  COMPARISON:  None.  FINDINGS: Three views of the right ankle submitted. No acute fracture or subluxation. Mild soft tissue swelling just inferior to lateral malleolus. There is spurring of distal tibia medial malleolus. Ankle mortise is preserved.  IMPRESSION: No acute fracture or subluxation. Mild soft tissue swelling just inferior to lateral malleolus.   Electronically Signed   By: Lahoma Crocker M.D.   On: 08/10/2014 13:06     EKG Interpretation None       MDM   Final diagnoses:  Right ankle injury, initial encounter   Patient X-Ray negative for obvious fracture or dislocation. Pain managed in ED. Pt without erythema, edema or warmth to joint. Neurovascularly intact. I doubt septic arthritis. Pt advised to follow up with PCP/orthopedics if symptoms persist. Patient given brace while in ED, RICE, ibuprofen and conservative therapy recommended and discussed.   Discussed return precautions with patient. Discussed all results and patient verbalizes understanding and agrees with plan.  I personally performed the services described in this documentation, which was scribed in my presence. The recorded information has been reviewed and is accurate.    Al Corpus, PA-C 08/10/14 [place], MD 08/11/14 [phone]

## 2014-08-10 NOTE — Discharge Instructions (Signed)
Return to the emergency room with worsening of symptoms, new symptoms or with symptoms that are concerning, especially fevers, redness, swelling, numbness, tingling, unable to move toes. RICE: Rest, Ice (three cycles of 20 mins on, 74mns off at least twice a day), compression/brace, elevation. Heating pad works well for back pain. Ibuprofen 4086m(2 tablets 20028mevery 5-6 hours for 3-5 days. Follow up with PCP/orthopedist if symptoms worsen or are persistent. Read below information and follow recommendations. Ankle Sprain An ankle sprain is an injury to the strong, fibrous tissues (ligaments) that hold the bones of your ankle joint together.  CAUSES An ankle sprain is usually caused by a fall or by twisting your ankle. Ankle sprains most commonly occur when you step on the outer edge of your foot, and your ankle turns inward. People who participate in sports are more prone to these types of injuries.  SYMPTOMS   Pain in your ankle. The pain may be present at rest or only when you are trying to stand or walk.  Swelling.  Bruising. Bruising may develop immediately or within 1 to 2 days after your injury.  Difficulty standing or walking, particularly when turning corners or changing directions. DIAGNOSIS  Your caregiver will ask you details about your injury and perform a physical exam of your ankle to determine if you have an ankle sprain. During the physical exam, your caregiver will press on and apply pressure to specific areas of your foot and ankle. Your caregiver will try to move your ankle in certain ways. An X-ray exam may be done to be sure a bone was not broken or a ligament did not separate from one of the bones in your ankle (avulsion fracture).  TREATMENT  Certain types of braces can help stabilize your ankle. Your caregiver can make a recommendation for this. Your caregiver may recommend the use of medicine for pain. If your sprain is severe, your caregiver may refer you to a  surgeon who helps to restore function to parts of your skeletal system (orthopedist) or a physical therapist. [place] to your injury for 1-2 days or as directed by your caregiver. Applying ice helps to reduce inflammation and pain.  Put ice in a plastic bag.  Place a towel between your skin and the bag.  Leave the ice on for 15-20 minutes at a time, every 2 hours while you are awake.  Only take over-the-counter or prescription medicines for pain, discomfort, or fever as directed by your caregiver.  Elevate your injured ankle above the level of your heart as much as possible for 2-3 days.  If your caregiver recommends crutches, use them as instructed. Gradually put weight on the affected ankle. Continue to use crutches or a cane until you can walk without feeling pain in your ankle.  If you have a plaster splint, wear the splint as directed by your caregiver. Do not rest it on anything harder than a pillow for the first 24 hours. Do not put weight on it. Do not get it wet. You may take it off to take a shower or bath.  You may have been given an elastic bandage to wear around your ankle to provide support. If the elastic bandage is too tight (you have numbness or tingling in your foot or your foot becomes cold and blue), adjust the bandage to make it comfortable.  If you have an air splint, you may blow more air into it or let air out  to make it more comfortable. You may take your splint off at night and before taking a shower or bath. Wiggle your toes in the splint several times per day to decrease swelling. SEEK MEDICAL CARE IF:   You have rapidly increasing bruising or swelling.  Your toes feel extremely cold or you lose feeling in your foot.  Your pain is not relieved with medicine. SEEK IMMEDIATE MEDICAL CARE IF:  Your toes are numb or blue.  You have severe pain that is increasing. MAKE SURE YOU:   Understand these instructions.  Will watch your  condition.  Will get help right away if you are not doing well or get worse. Document Released: 02/13/2005 Document Revised: 11/08/2011 Document Reviewed: 02/25/2011 [facility] Patient Information 2015 [place], [place]. This information is not intended to replace advice given to you by your health care provider. Make sure you discuss any questions you have with your health care provider.

## 2014-09-17 ENCOUNTER — Ambulatory Visit (INDEPENDENT_AMBULATORY_CARE_PROVIDER_SITE_OTHER): Payer: Medicaid Other | Admitting: Obstetrics

## 2014-09-17 ENCOUNTER — Encounter: Payer: Self-pay | Admitting: Obstetrics

## 2014-09-17 VITALS — BP 122/81 | HR 64 | Temp 97.7°F | Ht 64.0 in | Wt 238.0 lb

## 2014-09-17 DIAGNOSIS — Z0000 Encounter for general adult medical examination without abnormal findings: Secondary | ICD-10-CM

## 2014-09-17 DIAGNOSIS — Z124 Encounter for screening for malignant neoplasm of cervix: Secondary | ICD-10-CM | POA: Diagnosis not present

## 2014-09-17 DIAGNOSIS — K5909 Other constipation: Secondary | ICD-10-CM

## 2014-09-17 DIAGNOSIS — N3281 Overactive bladder: Secondary | ICD-10-CM

## 2014-09-17 DIAGNOSIS — M62838 Other muscle spasm: Secondary | ICD-10-CM

## 2014-09-17 DIAGNOSIS — K5904 Chronic idiopathic constipation: Secondary | ICD-10-CM

## 2014-09-17 DIAGNOSIS — Z01419 Encounter for gynecological examination (general) (routine) without abnormal findings: Secondary | ICD-10-CM

## 2014-09-17 DIAGNOSIS — Z1239 Encounter for other screening for malignant neoplasm of breast: Secondary | ICD-10-CM | POA: Diagnosis not present

## 2014-09-17 DIAGNOSIS — K219 Gastro-esophageal reflux disease without esophagitis: Secondary | ICD-10-CM

## 2014-09-17 DIAGNOSIS — G43009 Migraine without aura, not intractable, without status migrainosus: Secondary | ICD-10-CM | POA: Diagnosis not present

## 2014-09-17 MED ORDER — CYCLOBENZAPRINE HCL 10 MG PO TABS: 10.0000 mg | ORAL_TABLET | Freq: Three times a day (TID) | ORAL | Status: DC | PRN

## 2014-09-17 MED ORDER — NAPROXEN SODIUM 550 MG PO TABS: 550.0000 mg | ORAL_TABLET | Freq: Two times a day (BID) | ORAL | Status: DC

## 2014-09-17 MED ORDER — BUTALBITAL-APAP-CAFFEINE 50-325-40 MG PO TABS: 1.0000 | ORAL_TABLET | Freq: Four times a day (QID) | ORAL | Status: AC | PRN

## 2014-09-17 MED ORDER — OMEPRAZOLE 20 MG PO CPDR: 20.0000 mg | DELAYED_RELEASE_CAPSULE | Freq: Every day | ORAL | Status: DC

## 2014-09-17 NOTE — Progress Notes (Signed)
Subjective:        Kathryn Larson is a 44 y.o. female here for a routine exam.  Current complaints: Urinary frequency, mainly at night.  Also has a history of migraines, chronic constipation associated with abdominal pain, GERD, and depression.   Personal health questionnaire:  Is patient Ashkenazi Jewish, have a family history of breast and/or ovarian cancer: no Is there a family history of uterine cancer diagnosed at age < 76, gastrointestinal cancer, urinary tract cancer, family member who is a Field seismologist syndrome-associated carrier: no Is the patient overweight and hypertensive, family history of diabetes, personal history of gestational diabetes, preeclampsia or PCOS: no Is patient over 54, have PCOS,  family history of premature CHD under age 78, diabetes, smoke, have hypertension or peripheral artery disease:  no At any time, has a partner hit, kicked or otherwise hurt or frightened you?: no Over the past 2 weeks, have you felt down, depressed or hopeless?: no Over the past 2 weeks, have you felt little interest or pleasure in doing things?:no   Gynecologic History Patient's last menstrual period was 08/25/2014. Contraception: none Last Pap: 2015. Results were: normal Last mammogram: 2015. Results were: normal  Obstetric History OB History  Gravida Para Term Preterm AB SAB TAB Ectopic Multiple Living  6 2 2  4 4    2     # Outcome Date GA Lbr Len/2nd Weight Sex Delivery Anes PTL Lv  6 SAB           5 SAB           4 SAB           3 SAB           2 Term           1 Term               Past Medical History  Diagnosis Date  . GERD (gastroesophageal reflux disease) 1994  . Bipolar affective   . Rectal bleed     Hemorrhoids  . Anxiety   . Obesity   . Panic attacks   . Carpal tunnel syndrome   . Ovarian cyst   . Calculus of gallbladder with acute cholecystitis, without mention of obstruction 03/26/2013  . GERD (gastroesophageal reflux disease) 03/26/2013  . Bipolar  disorder, unspecified 03/26/2013  . Obesity, Class III, BMI 40-49.9 (morbid obesity) 03/26/2013  . Seizures     last seizure in May 2015    Past Surgical History  Procedure Laterality Date  . Ankle fracture surgery      Bilateral - right 2013, left 2010  . Ectopic pregnancy surgery    . Cholecystectomy N/A 03/24/2013    Procedure: LAPAROSCOPIC CHOLECYSTECTOMY;  Surgeon: [name], MD;  Location: [place];  Service: General;  Laterality: N/A;     Current outpatient prescriptions:  .  acetaminophen (TYLENOL) 325 MG tablet, Take 2 tablets (650 mg total) by mouth every 6 (six) hours as needed for mild pain, moderate pain, fever or headache (Do not take more than 4000 mg of tylenol (acetaminophen per day, it is in your prescribed medicine.)). (Patient not taking: Reported on 09/17/2014), Disp: , Rfl:  .  ALPRAZolam (XANAX) 1 MG tablet, Take 1 mg by mouth 3 (three) times daily., Disp: , Rfl:  .  butalbital-acetaminophen-caffeine (FIORICET) 50-325-40 MG per tablet, Take 1-2 tablets by mouth every 6 (six) hours as needed for headache or migraine., Disp: 40 tablet, Rfl: 2 .  cyclobenzaprine (FLEXERIL) 10  MG tablet, Take 1 tablet (10 mg total) by mouth every 8 (eight) hours as needed for muscle spasms., Disp: 30 tablet, Rfl: 1 .  ibuprofen (ADVIL,MOTRIN) 200 MG tablet, You can take 2-3 tablets every 8 hours for pain as needed. (Patient not taking: Reported on 09/17/2014), Disp: 30 tablet, Rfl: 0 .  naproxen (NAPROSYN) 500 MG tablet, Take 1 tablet (500 mg total) by mouth 2 (two) times daily. (Patient not taking: Reported on 09/17/2014), Disp: 30 tablet, Rfl: 0 .  naproxen sodium (ANAPROX DS) 550 MG tablet, Take 1 tablet (550 mg total) by mouth 2 (two) times daily with a meal., Disp: 60 tablet, Rfl: 5 .  omeprazole (PRILOSEC) 20 MG capsule, Take 1 capsule (20 mg total) by mouth daily., Disp: 30 capsule, Rfl: 5 .  Phenyleph-CPM-DM-APAP (TYLENOL COLD MULTI-SYMPTOM) 06-28-08-325 MG TABS, Take 2 tablets by mouth  daily as needed (for cold)., Disp: , Rfl:  .  sertraline (ZOLOFT) 100 MG tablet, Take 100 mg by mouth 3 (three) times daily., Disp: , Rfl:  .  sucralfate (CARAFATE) 1 GM/10ML suspension, Take 10 mLs (1 g total) by mouth 4 (four) times daily -  with meals and at bedtime. (Patient not taking: Reported on 09/17/2014), Disp: 420 mL, Rfl: 0 No Known Allergies  History  Substance Use Topics  . Smoking status: Current Every Day Smoker    Last Attempt to Quit: 02/27/2010  . Smokeless tobacco: Not on file  . Alcohol Use: No    Family History  Problem Relation Age of Onset  . Diabetes Mother   . Diabetes Sister   . Cancer Maternal Grandmother       Review of Systems  Constitutional: negative for fatigue and weight loss Respiratory: negative for cough and wheezing Cardiovascular: negative for chest pain, fatigue and palpitations Gastrointestinal: positive for abdominal pain, change in bowel habits and GERD Musculoskeletal:negative for myalgias Neurological: negative for gait problems.  Positive for seizures  Behavioral/Psych: negative for abusive relationship.  Positive for depression, anxiety Endocrine: negative for temperature intolerance   Genitourinary:negative for abnormal menstrual periods, genital lesions, hot flashes, sexual problems and vaginal discharge.  Positive for nocturia Integument/breast: negative for breast lump, breast tenderness, nipple discharge and skin lesion(s)    Objective:       BP 122/81 mmHg  Pulse 64  Temp(Src) 97.7 F (36.5 C)  Ht 5' 4"  (1.626 m)  Wt 238 lb (107.956 kg)  BMI 40.83 kg/m2  LMP 08/25/2014 General:   alert  Skin:   no rash or abnormalities  Lungs:   clear to auscultation bilaterally  Heart:   regular rate and rhythm, S1, S2 normal, no murmur, click, rub or gallop  Breasts:   normal without suspicious masses, skin or nipple changes or axillary nodes  Abdomen:  normal findings: no organomegaly, soft, non-tender and no hernia  Pelvis:   External genitalia: normal general appearance Urinary system: urethral meatus normal and bladder without fullness, nontender Vaginal: normal without tenderness, induration or masses Cervix: normal appearance Adnexa: normal bimanual exam Uterus: anteverted and non-tender, normal size   Lab Review Urine pregnancy test Labs reviewed no Radiologic studies reviewed no   Assessment:    Healthy female exam.    Migraines  Constipation - chronic.  R/O IBS  GERD  OAB  Bipolar disorder   Anxiety  H/O seizures  Obesity     Plan:   Fioricet Rx for migraines Prilosec Rx for GERD Naprosyn / Flexeril Rx for muscular spasms Referred to Urology for OAB  Referred to Gastroenterology for constipation and GERD Referred to Internal Medicine for routine health maintenance F/U 1 year for pap   Education reviewed: calcium supplements, low fat, low cholesterol diet, safe sex/STD prevention, self breast exams, smoking cessation and weight bearing exercise. Mammogram ordered. Follow up in: 1 year.   Meds ordered this encounter  Medications  . butalbital-acetaminophen-caffeine (FIORICET) 50-325-40 MG per tablet    Sig: Take 1-2 tablets by mouth every 6 (six) hours as needed for headache or migraine.    Dispense:  40 tablet    Refill:  2  . naproxen sodium (ANAPROX DS) 550 MG tablet    Sig: Take 1 tablet (550 mg total) by mouth 2 (two) times daily with a meal.    Dispense:  60 tablet    Refill:  5  . cyclobenzaprine (FLEXERIL) 10 MG tablet    Sig: Take 1 tablet (10 mg total) by mouth every 8 (eight) hours as needed for muscle spasms.    Dispense:  30 tablet    Refill:  1  . omeprazole (PRILOSEC) 20 MG capsule    Sig: Take 1 capsule (20 mg total) by mouth daily.    Dispense:  30 capsule    Refill:  5   Orders Placed This Encounter  Procedures  . SureSwab, Vaginosis/Vaginitis Plus  . MM DIGITAL SCREENING BILATERAL    Medicaid// pt contact    Standing Status: Future     Number of  Occurrences:      Standing Expiration Date: 11/18/2015    Order Specific Question:  Reason for Exam (SYMPTOM  OR DIAGNOSIS REQUIRED)    Answer:  screening    Order Specific Question:  Is the patient pregnant?    Answer:  No    Order Specific Question:  Preferred imaging location?    Answer:  [facility]  . Ambulatory referral to Urology    Referral Priority:  Routine    Referral Type:  Consultation    Referral Reason:  Specialty Services Required    Requested Specialty:  Urology    Number of Visits Requested:  1  . Ambulatory referral to Gastroenterology    Referral Priority:  Routine    Referral Type:  Consultation    Referral Reason:  Specialty Services Required    Number of Visits Requested:  1  . Ambulatory referral to Internal Medicine    Referral Priority:  Routine    Referral Type:  Consultation    Referral Reason:  Specialty Services Required    Requested Specialty:  Internal Medicine    Number of Visits Requested:  1

## 2014-09-18 LAB — PAP IG AND HPV HIGH-RISK: HPV DNA High Risk: NOT DETECTED

## 2014-09-21 ENCOUNTER — Ambulatory Visit: Payer: Medicaid Other | Admitting: Gastroenterology

## 2014-09-21 LAB — SURESWAB, VAGINOSIS/VAGINITIS PLUS
ATOPOBIUM VAGINAE: 7.3 Log (cells/mL)
C. TRACHOMATIS RNA, TMA: NOT DETECTED
C. albicans, DNA: NOT DETECTED
C. glabrata, DNA: NOT DETECTED
C. parapsilosis, DNA: NOT DETECTED
C. tropicalis, DNA: NOT DETECTED
Gardnerella vaginalis: >8 Log (cells/mL)
LACTOBACILLUS SPECIES: NOT DETECTED Log (cells/mL)
MEGASPHAERA SPECIES: 7.6 Log (cells/mL)
N. gonorrhoeae RNA, TMA: NOT DETECTED
T. vaginalis RNA, QL TMA: NOT DETECTED

## 2014-09-22 ENCOUNTER — Other Ambulatory Visit: Payer: Self-pay | Admitting: Obstetrics

## 2014-09-22 ENCOUNTER — Telehealth: Payer: Self-pay

## 2014-09-22 DIAGNOSIS — N760 Acute vaginitis: Principal | ICD-10-CM

## 2014-09-22 DIAGNOSIS — B9689 Other specified bacterial agents as the cause of diseases classified elsewhere: Secondary | ICD-10-CM

## 2014-09-22 MED ORDER — METRONIDAZOLE 500 MG PO TABS: 500.0000 mg | ORAL_TABLET | Freq: Two times a day (BID) | ORAL | Status: DC

## 2014-09-22 NOTE — Telephone Encounter (Signed)
Called patient to let her know about her appt with Dr. Nicki Reaper Macdiarmid on 10/12/14 at 9:45am - gave her their phone number and address

## 2014-09-30 ENCOUNTER — Ambulatory Visit (HOSPITAL_COMMUNITY): Payer: Medicaid Other | Attending: Obstetrics

## 2015-04-27 ENCOUNTER — Encounter (HOSPITAL_COMMUNITY): Payer: Self-pay | Admitting: Emergency Medicine

## 2015-04-27 ENCOUNTER — Emergency Department (HOSPITAL_COMMUNITY)
Admission: EM | Admit: 2015-04-27 | Discharge: 2015-04-27 | Disposition: A | Discharge status: Home / Self Care | Payer: Medicaid Other | Attending: Emergency Medicine | Admitting: Emergency Medicine

## 2015-04-27 DIAGNOSIS — F410 Panic disorder [episodic paroxysmal anxiety] without agoraphobia: Secondary | ICD-10-CM | POA: Diagnosis not present

## 2015-04-27 DIAGNOSIS — F319 Bipolar disorder, unspecified: Secondary | ICD-10-CM | POA: Insufficient documentation

## 2015-04-27 DIAGNOSIS — K219 Gastro-esophageal reflux disease without esophagitis: Secondary | ICD-10-CM | POA: Insufficient documentation

## 2015-04-27 DIAGNOSIS — Z79899 Other long term (current) drug therapy: Secondary | ICD-10-CM | POA: Insufficient documentation

## 2015-04-27 DIAGNOSIS — K625 Hemorrhage of anus and rectum: Secondary | ICD-10-CM | POA: Diagnosis present

## 2015-04-27 DIAGNOSIS — F17200 Nicotine dependence, unspecified, uncomplicated: Secondary | ICD-10-CM | POA: Diagnosis not present

## 2015-04-27 DIAGNOSIS — J069 Acute upper respiratory infection, unspecified: Secondary | ICD-10-CM | POA: Diagnosis not present

## 2015-04-27 DIAGNOSIS — Z792 Long term (current) use of antibiotics: Secondary | ICD-10-CM | POA: Insufficient documentation

## 2015-04-27 DIAGNOSIS — H9209 Otalgia, unspecified ear: Secondary | ICD-10-CM | POA: Insufficient documentation

## 2015-04-27 LAB — CBC
HCT: 44.4 % (ref 36.0–46.0)
HEMOGLOBIN: 15 g/dL (ref 12.0–15.0)
MCH: 31.6 pg (ref 26.0–34.0)
MCHC: 33.8 g/dL (ref 30.0–36.0)
MCV: 93.7 fL (ref 78.0–100.0)
Platelets: 324 10*3/uL (ref 150–400)
RBC: 4.74 MIL/uL (ref 3.87–5.11)
RDW: 13.3 % (ref 11.5–15.5)
WBC: 5.5 10*3/uL (ref 4.0–10.5)

## 2015-04-27 LAB — COMPREHENSIVE METABOLIC PANEL
ALK PHOS: 70 U/L (ref 38–126)
ALT: 30 U/L (ref 14–54)
AST: 28 U/L (ref 15–41)
Albumin: 4 g/dL (ref 3.5–5.0)
Anion gap: 10 (ref 5–15)
BUN: 8 mg/dL (ref 6–20)
CALCIUM: 9 mg/dL (ref 8.9–10.3)
CO2: 23 mmol/L (ref 22–32)
Chloride: 105 mmol/L (ref 101–111)
Creatinine, Ser: 0.8 mg/dL (ref 0.44–1.00)
Glucose, Bld: 90 mg/dL (ref 65–99)
Potassium: 3.7 mmol/L (ref 3.5–5.1)
Sodium: 138 mmol/L (ref 135–145)
Total Bilirubin: 0.2 mg/dL — ABNORMAL LOW (ref 0.3–1.2)
Total Protein: 7.9 g/dL (ref 6.5–8.1)

## 2015-04-27 LAB — TYPE AND SCREEN
ABO/RH(D): B POS
ANTIBODY SCREEN: NEGATIVE

## 2015-04-27 LAB — ABO/RH: ABO/RH(D): B POS

## 2015-04-27 MED ORDER — BENZONATATE 100 MG PO CAPS: 100.0000 mg | ORAL_CAPSULE | Freq: Three times a day (TID) | ORAL | Status: DC

## 2015-04-27 MED ORDER — GUAIFENESIN ER 600 MG PO TB12: 600.0000 mg | ORAL_TABLET | Freq: Two times a day (BID) | ORAL | Status: DC | PRN

## 2015-04-27 NOTE — Discharge Instructions (Signed)

## 2015-04-27 NOTE — ED Notes (Signed)
Pt has multiple complaints. Ear pain, chest congestion with cough with green mucus, and states last night she began passing bowel movements with dark clots of blood in them that's caused her to feel weak into today.

## 2015-04-27 NOTE — ED Provider Notes (Signed)
CSN: [id number]     Arrival date & time 04/27/15  1310 History   First MD Initiated Contact with Patient 04/27/15 1834     Chief Complaint  Patient presents with  . Rectal Bleeding  . Cough  . Otalgia    HPI Sx started with cough and congestion.  That started 3 days ago.  Her symptoms persisted and it felt like it moved down into her chest.  She has been coughing a lot. Today she also started noticing blood in her stool when she began having diarrhea.  Previously she had some hard stools. She does have a history of rectal bleeding associated with hemorrhoids.  No weakness.  No vomiting.   Past Medical History  Diagnosis Date  . GERD (gastroesophageal reflux disease) 1994  . Bipolar affective ([place])   . Rectal bleed     Hemorrhoids  . Anxiety   . Obesity   . Panic attacks   . Carpal tunnel syndrome   . Ovarian cyst   . Calculus of gallbladder with acute cholecystitis, without mention of obstruction 03/26/2013  . GERD (gastroesophageal reflux disease) 03/26/2013  . Bipolar disorder, unspecified ([place]) 03/26/2013  . Obesity, Class III, BMI 40-49.9 (morbid obesity) ([place]) 03/26/2013  . Seizures ([place])     last seizure in May 2015   Past Surgical History  Procedure Laterality Date  . Ankle fracture surgery      Bilateral - right 2013, left 2010  . Ectopic pregnancy surgery    . Cholecystectomy N/A 03/24/2013    Procedure: LAPAROSCOPIC CHOLECYSTECTOMY;  Surgeon: [name], MD;  Location: [facility] OR;  Service: General;  Laterality: N/A;   Family History  Problem Relation Age of Onset  . Diabetes Mother   . Diabetes Sister   . Cancer Maternal Grandmother    Social History  Substance Use Topics  . Smoking status: Current Every Day Smoker    Last Attempt to Quit: 02/27/2010  . Smokeless tobacco: None  . Alcohol Use: No   OB History    Gravida Para Term Preterm AB TAB SAB Ectopic Multiple Living   6 2 2  4  4   2      Review of Systems  All other systems reviewed and are  negative.     Allergies  Review of patient's allergies indicates no known allergies.  Home Medications   Prior to Admission medications   Medication Sig Start Date End Date Taking? Authorizing Provider  acetaminophen (TYLENOL) 500 MG tablet Take 1,000 mg by mouth every 6 (six) hours as needed for headache.   Yes Historical Provider, MD  ALPRAZolam Duanne Moron) 1 MG tablet Take 1 mg by mouth 3 (three) times daily.   Yes Historical Provider, MD  Aspirin-Acetaminophen-Caffeine (GOODY HEADACHE PO) Take 1 packet by mouth daily as needed (pain).   Yes Historical Provider, MD  butalbital-acetaminophen-caffeine (FIORICET) 50-325-40 MG per tablet Take 1-2 tablets by mouth every 6 (six) hours as needed for headache or migraine. 09/17/14 09/17/15 Yes Shelly Bombard, MD  cyclobenzaprine (FLEXERIL) 10 MG tablet Take 1 tablet (10 mg total) by mouth every 8 (eight) hours as needed for muscle spasms. 09/17/14  Yes Shelly Bombard, MD  metroNIDAZOLE (FLAGYL) 500 MG tablet Take 1 tablet (500 mg total) by mouth 2 (two) times daily. 09/22/14  Yes Shelly Bombard, MD  omeprazole (PRILOSEC) 20 MG capsule Take 1 capsule (20 mg total) by mouth daily. Patient taking differently: Take 20 mg by mouth daily as needed (acid reflux).  09/17/14  Yes Shelly Bombard, MD  Phenyleph-CPM-DM-APAP (TYLENOL COLD MULTI-SYMPTOM) 06-28-08-325 MG TABS Take 2 tablets by mouth daily as needed (for cold).   Yes Historical Provider, MD  sertraline (ZOLOFT) 100 MG tablet Take 100 mg by mouth 3 (three) times daily.   Yes Historical Provider, MD  benzonatate (TESSALON) 100 MG capsule Take 1 capsule (100 mg total) by mouth every 8 (eight) hours. 04/27/15   Dorie Rank, MD  guaiFENesin (MUCINEX) 600 MG 12 hr tablet Take 1 tablet (600 mg total) by mouth 2 (two) times daily as needed. 04/27/15   Dorie Rank, MD   BP 134/92 mmHg  Pulse 60  Temp(Src) 98 F (36.7 C) (Oral)  Resp 18  SpO2 100% Physical Exam  Constitutional: She appears well-developed  and well-nourished. No distress.  HENT:  Head: Normocephalic and atraumatic.  Right Ear: Tympanic membrane and external ear normal.  Left Ear: Tympanic membrane and external ear normal.  Eyes: Conjunctivae are normal. Right eye exhibits no discharge. Left eye exhibits no discharge. No scleral icterus.  Neck: Neck supple. No tracheal deviation present.  Cardiovascular: Normal rate, regular rhythm and intact distal pulses.   Pulmonary/Chest: Effort normal and breath sounds normal. No stridor. No respiratory distress. She has no wheezes. She has no rales.  Abdominal: Soft. Bowel sounds are normal. She exhibits no distension. There is no tenderness. There is no rebound and no guarding.  Genitourinary:  No external hemorrhoids, no blood noted on external exam  Musculoskeletal: She exhibits no edema or tenderness.  Neurological: She is alert. She has normal strength. No cranial nerve deficit (no facial droop, extraocular movements intact, no slurred speech) or sensory deficit. She exhibits normal muscle tone. She displays no seizure activity. Coordination normal.  Skin: Skin is warm and dry. No rash noted.  Psychiatric: She has a normal mood and affect.  Nursing note and vitals reviewed.   ED Course  Procedures (including critical care time) Labs Review Labs Reviewed  COMPREHENSIVE METABOLIC PANEL - Abnormal; Notable for the following:    Total Bilirubin 0.2 (*)    All other components within normal limits  CBC  POC OCCULT BLOOD, ED  TYPE AND SCREEN  ABO/RH    MDM   Final diagnoses:  URI, acute  Rectal bleeding    The patient's symptoms are suggestive of an upper respiratory infection. Her lungs are clear. She is not hypoxic. Her vital signs are stable. I doubt pneumonia or more serious infection  Patient has noticed some rectal bleeding. She does have a history of hemorrhoids. Hemoglobin is stable and there is no evidence of bleeding at this point.  I recommend follow-up with  primary care doctor if the symptoms persist   Dorie Rank, MD 04/27/15 5732

## 2015-08-06 ENCOUNTER — Other Ambulatory Visit: Payer: Self-pay | Admitting: Obstetrics

## 2015-11-19 ENCOUNTER — Ambulatory Visit: Payer: Medicaid Other | Attending: Family Medicine | Admitting: Family Medicine

## 2015-11-19 ENCOUNTER — Encounter: Payer: Self-pay | Admitting: Family Medicine

## 2015-11-19 VITALS — BP 113/81 | HR 90 | Temp 98.0°F | Resp 17 | Ht 63.0 in | Wt 263.0 lb

## 2015-11-19 DIAGNOSIS — Z131 Encounter for screening for diabetes mellitus: Secondary | ICD-10-CM | POA: Diagnosis not present

## 2015-11-19 DIAGNOSIS — M25571 Pain in right ankle and joints of right foot: Secondary | ICD-10-CM | POA: Diagnosis present

## 2015-11-19 DIAGNOSIS — M25572 Pain in left ankle and joints of left foot: Secondary | ICD-10-CM | POA: Insufficient documentation

## 2015-11-19 DIAGNOSIS — F419 Anxiety disorder, unspecified: Secondary | ICD-10-CM | POA: Insufficient documentation

## 2015-11-19 DIAGNOSIS — Z23 Encounter for immunization: Secondary | ICD-10-CM | POA: Insufficient documentation

## 2015-11-19 DIAGNOSIS — Z6841 Body Mass Index (BMI) 40.0 and over, adult: Secondary | ICD-10-CM | POA: Diagnosis not present

## 2015-11-19 DIAGNOSIS — Z9889 Other specified postprocedural states: Secondary | ICD-10-CM | POA: Insufficient documentation

## 2015-11-19 DIAGNOSIS — K219 Gastro-esophageal reflux disease without esophagitis: Secondary | ICD-10-CM

## 2015-11-19 DIAGNOSIS — I10 Essential (primary) hypertension: Secondary | ICD-10-CM | POA: Diagnosis not present

## 2015-11-19 DIAGNOSIS — Z7982 Long term (current) use of aspirin: Secondary | ICD-10-CM | POA: Diagnosis not present

## 2015-11-19 DIAGNOSIS — Z9181 History of falling: Secondary | ICD-10-CM | POA: Diagnosis not present

## 2015-11-19 DIAGNOSIS — Z79899 Other long term (current) drug therapy: Secondary | ICD-10-CM | POA: Insufficient documentation

## 2015-11-19 DIAGNOSIS — G5600 Carpal tunnel syndrome, unspecified upper limb: Secondary | ICD-10-CM | POA: Insufficient documentation

## 2015-11-19 DIAGNOSIS — Z9049 Acquired absence of other specified parts of digestive tract: Secondary | ICD-10-CM | POA: Diagnosis not present

## 2015-11-19 DIAGNOSIS — M6740 Ganglion, unspecified site: Secondary | ICD-10-CM

## 2015-11-19 DIAGNOSIS — M25579 Pain in unspecified ankle and joints of unspecified foot: Secondary | ICD-10-CM | POA: Insufficient documentation

## 2015-11-19 DIAGNOSIS — M67431 Ganglion, right wrist: Secondary | ICD-10-CM | POA: Insufficient documentation

## 2015-11-19 DIAGNOSIS — F319 Bipolar disorder, unspecified: Secondary | ICD-10-CM | POA: Diagnosis not present

## 2015-11-19 DIAGNOSIS — R296 Repeated falls: Secondary | ICD-10-CM

## 2015-11-19 LAB — COMPLETE METABOLIC PANEL WITH GFR
ALT: 14 U/L (ref 6–29)
AST: 15 U/L (ref 10–30)
Albumin: 4 g/dL (ref 3.6–5.1)
Alkaline Phosphatase: 68 U/L (ref 33–115)
BILIRUBIN TOTAL: 0.4 mg/dL (ref 0.2–1.2)
BUN: 8 mg/dL (ref 7–25)
CALCIUM: 9.7 mg/dL (ref 8.6–10.2)
CHLORIDE: 107 mmol/L (ref 98–110)
CO2: 22 mmol/L (ref 20–31)
Creat: 0.89 mg/dL (ref 0.50–1.10)
GFR, EST NON AFRICAN AMERICAN: 80 mL/min (ref >=60)
Glucose, Bld: 90 mg/dL (ref 65–99)
Potassium: 4.5 mmol/L (ref 3.5–5.3)
Sodium: 139 mmol/L (ref 135–146)
TOTAL PROTEIN: 7.2 g/dL (ref 6.1–8.1)

## 2015-11-19 LAB — LIPID PANEL
CHOLESTEROL: 191 mg/dL (ref 125–200)
HDL: 40 mg/dL — AB (ref >=46)
LDL CALC: 132 mg/dL — AB (ref <130)
TRIGLYCERIDES: 95 mg/dL (ref <150)
Total CHOL/HDL Ratio: 4.8 Ratio (ref <=5.0)
VLDL: 19 mg/dL (ref <30)

## 2015-11-19 MED ORDER — ACETAMINOPHEN-CODEINE #3 300-30 MG PO TABS: 1.0000 | ORAL_TABLET | Freq: Two times a day (BID) | ORAL | 1 refills | Status: DC | PRN

## 2015-11-19 MED ORDER — OMEPRAZOLE 20 MG PO CPDR: 20.0000 mg | DELAYED_RELEASE_CAPSULE | Freq: Every day | ORAL | 5 refills | Status: DC

## 2015-11-19 MED ORDER — LISINOPRIL 5 MG PO TABS
5.0000 mg | ORAL_TABLET | Freq: Every day | ORAL | 5 refills | Status: DC
Start: 2015-11-19 — End: 2016-06-15

## 2015-11-19 MED ORDER — CYCLOBENZAPRINE HCL 10 MG PO TABS: ORAL_TABLET | ORAL | 3 refills | Status: DC

## 2015-11-19 NOTE — Progress Notes (Signed)
Pt is in today as a new patient  Pt complains of pain in her right ankle, pain is an 8 Pt would like information about home care services  Pt has not eaten today  Pt has only had her blood pressure medication today  Pt consents to getting flu vaccine today  Pt states she has poor appetite and sleep habits  Pt has a small knot on her right wrist from a recent fall  Pt would like to be tested for diabetes due to familial history  Pt is interested in the ensure/glucerna nutrition drinks

## 2015-11-19 NOTE — Patient Instructions (Addendum)
Hypertension Hypertension, commonly called high blood pressure, is when the force of blood pumping through your arteries is too strong. Your arteries are the blood vessels that carry blood from your heart throughout your body. A blood pressure reading consists of a higher number over a lower number, such as 110/72. The higher number (systolic) is the pressure inside your arteries when your heart pumps. The lower number (diastolic) is the pressure inside your arteries when your heart relaxes. Ideally you want your blood pressure below 120/80. Hypertension forces your heart to work harder to pump blood. Your arteries may become narrow or stiff. Having untreated or uncontrolled hypertension can cause heart attack, stroke, kidney disease, and other problems. RISK FACTORS Some risk factors for high blood pressure are controllable. Others are not.  Risk factors you cannot control include:   Race. You may be at higher risk if you are African American.  Age. Risk increases with age.  Gender. Men are at higher risk than women before age 45 years. After age 65, women are at higher risk than men. Risk factors you can control include:  Not getting enough exercise or physical activity.  Being overweight.  Getting too much fat, sugar, calories, or salt in your diet.  Drinking too much alcohol. SIGNS AND SYMPTOMS Hypertension does not usually cause signs or symptoms. Extremely high blood pressure (hypertensive crisis) may cause headache, anxiety, shortness of breath, and nosebleed. DIAGNOSIS To check if you have hypertension, your health care provider will measure your blood pressure while you are seated, with your arm held at the level of your heart. It should be measured at least twice using the same arm. Certain conditions can cause a difference in blood pressure between your right and left arms. A blood pressure reading that is higher than normal on one occasion does not mean that you need treatment. If  it is not clear whether you have high blood pressure, you may be asked to return on a different day to have your blood pressure checked again. Or, you may be asked to monitor your blood pressure at home for 1 or more weeks. TREATMENT Treating high blood pressure includes making lifestyle changes and possibly taking medicine. Living a healthy lifestyle can help lower high blood pressure. You may need to change some of your habits. Lifestyle changes may include:  Following the DASH diet. This diet is high in fruits, vegetables, and whole grains. It is low in salt, red meat, and added sugars.  Keep your sodium intake below 2,300 mg per day.  Getting at least 30-45 minutes of aerobic exercise at least 4 times per week.  Losing weight if necessary.  Not smoking.  Limiting alcoholic beverages.  Learning ways to reduce stress. Your health care provider may prescribe medicine if lifestyle changes are not enough to get your blood pressure under control, and if one of the following is true:  You are 18-59 years of age and your systolic blood pressure is above 140.  You are 60 years of age or older, and your systolic blood pressure is above 150.  Your diastolic blood pressure is above 90.  You have diabetes, and your systolic blood pressure is over 140 or your diastolic blood pressure is over 90.  You have kidney disease and your blood pressure is above 140/90.  You have heart disease and your blood pressure is above 140/90. Your personal target blood pressure may vary depending on your medical conditions, your age, and other factors. HOME CARE INSTRUCTIONS    Have your blood pressure rechecked as directed by your health care provider.   Take medicines only as directed by your health care provider. Follow the directions carefully. Blood pressure medicines must be taken as prescribed. The medicine does not work as well when you skip doses. Skipping doses also puts you at risk for  problems.  Do not smoke.   Monitor your blood pressure at home as directed by your health care provider. SEEK MEDICAL CARE IF:   You think you are having a reaction to medicines taken.  You have recurrent headaches or feel dizzy.  You have swelling in your ankles.  You have trouble with your vision. SEEK IMMEDIATE MEDICAL CARE IF:  You develop a severe headache or confusion.  You have unusual weakness, numbness, or feel faint.  You have severe chest or abdominal pain.  You vomit repeatedly.  You have trouble breathing. MAKE SURE YOU:   Understand these instructions.  Will watch your condition.  Will get help right away if you are not doing well or get worse.   This information is not intended to replace advice given to you by your health care provider. Make sure you discuss any questions you have with your health care provider.   Document Released: 02/13/2005 Document Revised: 06/30/2014 Document Reviewed: 12/06/2012 Elsevier Interactive Patient Education 2016 [place] (Flu) Vaccine (Inactivated or Recombinant):   1. Why get vaccinated? Influenza ("flu") is a contagious disease that spreads around the [place] every year, usually between October and May. Flu is caused by influenza viruses, and is spread mainly by coughing, sneezing, and close contact. Anyone can get flu. Flu strikes suddenly and can last several days. Symptoms vary by age, but can include:  fever/chills  sore throat  muscle aches  fatigue  cough  headache  runny or stuffy nose Flu can also lead to pneumonia and blood infections, and cause diarrhea and seizures in children. If you have a medical condition, such as heart or lung disease, flu can make it worse. Flu is more dangerous for some people. Infants and young children, people 38 years of age and older, pregnant women, and people with certain health conditions or a weakened immune system are at greatest  risk. Each year thousands of people in the [place] die from flu, and many more are hospitalized. Flu vaccine can:  keep you from getting flu,  make flu less severe if you do get it, and  keep you from spreading flu to your family and other people. 2. Inactivated and recombinant flu vaccines A dose of flu vaccine is recommended every flu season. Children 6 months through 32 years of age may need two doses during the same flu season. Everyone else needs only one dose each flu season. Some inactivated flu vaccines contain a very small amount of a mercury-based preservative called thimerosal. Studies have not shown thimerosal in vaccines to be harmful, but flu vaccines that do not contain thimerosal are available. There is no live flu virus in flu shots. They cannot cause the flu. There are many flu viruses, and they are always changing. Each year a new flu vaccine is made to protect against three or four viruses that are likely to cause disease in the upcoming flu season. But even when the vaccine doesn't exactly match these viruses, it may still provide some protection. Flu vaccine cannot prevent:  flu that is caused by a virus not covered by the vaccine, or  illnesses that look like  flu but are not. It takes about 2 weeks for protection to develop after vaccination, and protection lasts through the flu season. 3. Some people should not get this vaccine Tell the person who is giving you the vaccine:  If you have any severe, life-threatening allergies. If you ever had a life-threatening allergic reaction after a dose of flu vaccine, or have a severe allergy to any part of this vaccine, you may be advised not to get vaccinated. Most, but not all, types of flu vaccine contain a small amount of egg protein.  If you ever had Guillain-Barre Syndrome (also called GBS). Some people with a history of GBS should not get this vaccine. This should be discussed with your doctor.  If you are not  feeling well. It is usually okay to get flu vaccine when you have a mild illness, but you might be asked to come back when you feel better. 4. Risks of a vaccine reaction With any medicine, including vaccines, there is a chance of reactions. These are usually mild and go away on their own, but serious reactions are also possible. Most people who get a flu shot do not have any problems with it. Minor problems following a flu shot include:  soreness, redness, or swelling where the shot was given  hoarseness  sore, red or itchy eyes  cough  fever  aches  headache  itching  fatigue If these problems occur, they usually begin soon after the shot and last 1 or 2 days. More serious problems following a flu shot can include the following:  There may be a small increased risk of Guillain-Barre Syndrome (GBS) after inactivated flu vaccine. This risk has been estimated at 1 or 2 additional cases per million people vaccinated. This is much lower than the risk of severe complications from flu, which can be prevented by flu vaccine.  Young children who get the flu shot along with pneumococcal vaccine (PCV13) and/or DTaP vaccine at the same time might be slightly more likely to have a seizure caused by fever. Ask your doctor for more information. Tell your doctor if a child who is getting flu vaccine has ever had a seizure. Problems that could happen after any injected vaccine:  People sometimes faint after a medical procedure, including vaccination. Sitting or lying down for about 15 minutes can help prevent fainting, and injuries caused by a fall. Tell your doctor if you feel dizzy, or have vision changes or ringing in the ears.  Some people get severe pain in the shoulder and have difficulty moving the arm where a shot was given. This happens very rarely.  Any medication can cause a severe allergic reaction. Such reactions from a vaccine are very rare, estimated at about 1 in a million doses,  and would happen within a few minutes to a few hours after the vaccination. As with any medicine, there is a very remote chance of a vaccine causing a serious injury or death. The safety of vaccines is always being monitored. For more information, visit: http://www.aguilar.org/ 5. What if there is a serious reaction? What should I look for?  Look for anything that concerns you, such as signs of a severe allergic reaction, very high fever, or unusual behavior. Signs of a severe allergic reaction can include hives, swelling of the face and throat, difficulty breathing, a fast heartbeat, dizziness, and weakness. These would start a few minutes to a few hours after the vaccination. What should I do?  If you think  it is a severe allergic reaction or other emergency that can't wait, call 9-1-1 and get the person to the nearest hospital. Otherwise, call your doctor.  Reactions should be reported to the Vaccine Adverse Event Reporting System (VAERS). Your doctor should file this report, or you can do it yourself through the VAERS web site at www.vaers.SamedayNews.es, or by calling 973-190-7501. VAERS does not give medical advice. 6. The National Vaccine Injury Compensation Program The [employer] Vaccine Injury Compensation Program (VICP) is a federal program that was created to compensate people who may have been injured by certain vaccines. Persons who believe they may have been injured by a vaccine can learn about the program and about filing a claim by calling (514) 592-7738 or visiting the [place] website at GoldCloset.com.ee. There is a time limit to file a claim for compensation. 7. How can I learn more?  Ask your healthcare provider. He or she can give you the vaccine package insert or suggest other sources of information.  Call your local or state health department.  Contact the Centers for Disease Control and Prevention (CDC):  Call 314 668 0070 (1-800-CDC-INFO) or  Visit CDC's  website at https://gibson.com/ Vaccine Information Statement Inactivated Influenza Vaccine (10/03/2013)   This information is not intended to replace advice given to you by your health care provider. Make sure you discuss any questions you have with your health care provider.   Document Released: 12/08/2005 Document Revised: 03/06/2014 Document Reviewed: 10/06/2013 Elsevier Interactive Patient Education [employer].

## 2015-11-19 NOTE — Progress Notes (Signed)
Subjective:  Patient ID: Kathryn Larson, female    DOB: [birth year]  Age: 45 y.o. MRN: [id number]  CC: Establish Care and Ankle Pain   HPI Kathryn Larson is a 45 year old morbidly obese female with a medical history of hypertension, bipolar disorder, GERD who relocated from [place] 2 years ago and comes into the clinic to establish care.  She is requesting refills of her chronic medications. Suffers from chronic bilateral ankle pain and is status post bilateral ankle surgery; currently wearing a brace on her right ankle. Complains of frequent falls due to instability and balance issues and ambulates with the aid of a cane. Previously received Tylenol with codeine which she took for pain in the past; also takes Flexeril for back muscle spasms. She is requesting home health aide referral.  Also complains of an anterior right wrist swelling which is tender to touch; endorses a history of carpal tunnel syndrome in the past.  Bipolar disorder is managed by mental health.  Past Medical History:  Diagnosis Date  . Anxiety   . Bipolar affective ([place])   . Bipolar disorder, unspecified ([place]) 03/26/2013  . Calculus of gallbladder with acute cholecystitis, without mention of obstruction 03/26/2013  . Carpal tunnel syndrome   . GERD (gastroesophageal reflux disease) 1994  . GERD (gastroesophageal reflux disease) 03/26/2013  . Obesity   . Obesity, Class III, BMI 40-49.9 (morbid obesity) ([place]) 03/26/2013  . Ovarian cyst   . Panic attacks   . Rectal bleed    Hemorrhoids  . Seizures ([place])    last seizure in May 2015    Past Surgical History:  Procedure Laterality Date  . ANKLE FRACTURE SURGERY     Bilateral - right 2013, left 2010  . CHOLECYSTECTOMY N/A 03/24/2013   Procedure: LAPAROSCOPIC CHOLECYSTECTOMY;  Surgeon: [name], MD;  Location: [facility];  Service: General;  Laterality: N/A;  . ECTOPIC PREGNANCY SURGERY      No Known Allergies  Outpatient Medications Prior to Visit    Medication Sig Dispense Refill  . ALPRAZolam (XANAX) 1 MG tablet Take 1 mg by mouth 3 (three) times daily.    . cyclobenzaprine (FLEXERIL) 10 MG tablet TAKE 1 TABLET BY MOUTH EVERY 8 HOURS AS NEEDED FOR MUSCLE SPASMS 30 tablet 0  . omeprazole (PRILOSEC) 20 MG capsule Take 1 capsule (20 mg total) by mouth daily. (Patient taking differently: Take 20 mg by mouth daily as needed (acid reflux). ) 30 capsule 5  . acetaminophen (TYLENOL) 500 MG tablet Take 1,000 mg by mouth every 6 (six) hours as needed for headache.    . Aspirin-Acetaminophen-Caffeine (GOODY HEADACHE PO) Take 1 packet by mouth daily as needed (pain).    . Phenyleph-CPM-DM-APAP (TYLENOL COLD MULTI-SYMPTOM) 06-28-08-325 MG TABS Take 2 tablets by mouth daily as needed (for cold).    . sertraline (ZOLOFT) 100 MG tablet Take 100 mg by mouth 3 (three) times daily.    . benzonatate (TESSALON) 100 MG capsule Take 1 capsule (100 mg total) by mouth every 8 (eight) hours. (Patient not taking: Reported on 11/19/2015) 21 capsule 0  . guaiFENesin (MUCINEX) 600 MG 12 hr tablet Take 1 tablet (600 mg total) by mouth 2 (two) times daily as needed. (Patient not taking: Reported on 11/19/2015) 14 tablet 1  . metroNIDAZOLE (FLAGYL) 500 MG tablet Take 1 tablet (500 mg total) by mouth 2 (two) times daily. 14 tablet 2   No facility-administered medications prior to visit.     ROS Review of Systems  Constitutional: Negative  for activity change, appetite change and fatigue.  HENT: Negative for congestion, sinus pressure and sore throat.   Eyes: Negative for visual disturbance.  Respiratory: Negative for cough, chest tightness, shortness of breath and wheezing.   Cardiovascular: Negative for chest pain and palpitations.  Gastrointestinal: Negative for abdominal distention, abdominal pain and constipation.  Endocrine: Negative for polydipsia.  Genitourinary: Negative for dysuria and frequency.  Musculoskeletal:       See hpi  Skin: Negative for rash.   Neurological: Negative for tremors, light-headedness and numbness.  Hematological: Does not bruise/bleed easily.  Psychiatric/Behavioral: Negative for agitation and behavioral problems.    Objective:  BP 113/81 (BP Location: Right Arm, Patient Position: Sitting, Cuff Size: Large)   Pulse 90   Temp 98 F (36.7 C) (Oral)   Resp 17   Ht 5' 3"  (1.6 m)   Wt 263 lb (119.3 kg)   LMP 11/12/2015 (Exact Date)   SpO2 98%   BMI 46.59 kg/m   BP/Weight 11/19/2015 04/27/2015 1/61/0960  Systolic BP 454 098 119  Diastolic BP 81 92 81  Wt. (Lbs) 263 - 238  BMI 46.59 - 40.83      Physical Exam  Constitutional: She is oriented to person, place, and time. She appears well-developed and well-nourished.  Morbidly obese  Cardiovascular: Normal rate, normal heart sounds and intact distal pulses.   No murmur heard. Pulmonary/Chest: Effort normal and breath sounds normal. She has no wheezes. She has no rales. She exhibits no tenderness.  Abdominal: Soft. Bowel sounds are normal. She exhibits no distension and no mass. There is no tenderness.  Musculoskeletal:  Right ankle brace  Neurological: She is alert and oriented to person, place, and time.     Assessment & Plan:   1. GERD without esophagitis - omeprazole (PRILOSEC) 20 MG capsule; Take 1 capsule (20 mg total) by mouth daily.  Dispense: 30 capsule; Refill: 5  2. Screening for diabetes mellitus   3. Bipolar disorder, unspecified ([place]) Continue Depakote, Zoloft Management mental health  4. Essential hypertension Controlled Discontinue prazosin Replaced with lisinopril. - COMPLETE METABOLIC PANEL WITH GFR - Lipid panel  5. Ganglion cyst Advised to use a wrist brace due to carpal tunnel Apply ice to ganglion cyst.  6. Frequent falls Fall precautions Ambulate with the aid of a walker - Ambulatory referral to [facility]  7. Obesity, Class III, BMI 40-49.9 (morbid obesity) ([place]) Advised to reduce portion sizes as high ability  to exercise is limited due to ankle pain  8. Pain in joint, ankle and foot, right Continue right wrist brace - Ambulatory referral to [facility]  9. Encounter for immunization - Flu Vaccine QUAD 36+ mos IM   Meds ordered this encounter  Medications  . omeprazole (PRILOSEC) 20 MG capsule    Sig: Take 1 capsule (20 mg total) by mouth daily.    Dispense:  30 capsule    Refill:  5  . cyclobenzaprine (FLEXERIL) 10 MG tablet    Sig: TAKE 1 TABLET BY MOUTH EVERY 12 HOURS AS NEEDED FOR MUSCLE SPASMS    Dispense:  60 tablet    Refill:  3  . lisinopril (PRINIVIL,ZESTRIL) 5 MG tablet    Sig: Take 1 tablet (5 mg total) by mouth daily.    Dispense:  30 tablet    Refill:  5  . acetaminophen-codeine (TYLENOL #3) 300-30 MG tablet    Sig: Take 1 tablet by mouth every 12 (twelve) hours as needed for moderate pain.  Dispense:  60 tablet    Refill:  1    Follow-up: Return in about 1 month (around 12/19/2015) for Coordination of care.   Arnoldo Morale MD

## 2015-11-23 ENCOUNTER — Telehealth: Payer: Self-pay

## 2015-11-23 NOTE — Telephone Encounter (Signed)
-----   Message from Arnoldo Morale, MD sent at 11/22/2015  2:36 PM EDT ----- Please inform the patient that labs are normal. Thank you.

## 2015-11-23 NOTE — Telephone Encounter (Signed)
Writer called patient per Dr. Jarold Song to inform her that labs came back normal.  Patient stated understanding.

## 2015-11-25 ENCOUNTER — Telehealth: Payer: Self-pay

## 2015-11-25 NOTE — Telephone Encounter (Signed)
Home health referral for [facility] aide ordered by Dr. Jarold Song. Informed Dr. Jarold Song that [facility] aide services are not skilled home health services. She clarified that she wanted an assessment for [place] for patient. Call placed to patient and [place] thoroughly discussed. Patient agreeable to independent assessment for [facility]. Form initiated. Awaiting completion and signature from Dr. Jarold Song. When completed, form will need to be faxed to [employer].

## 2015-11-26 NOTE — Progress Notes (Signed)
Completed Request for Independent Assessment for [place] form faxed to [employer].

## 2016-04-27 ENCOUNTER — Ambulatory Visit: Payer: Medicaid Other

## 2016-06-15 ENCOUNTER — Other Ambulatory Visit: Payer: Self-pay | Admitting: Family Medicine

## 2016-06-19 ENCOUNTER — Ambulatory Visit: Payer: Medicaid Other | Admitting: Obstetrics

## 2016-06-20 ENCOUNTER — Emergency Department (HOSPITAL_COMMUNITY)
Admission: EM | Admit: 2016-06-20 | Discharge: 2016-06-20 | Disposition: A | Discharge status: Home / Self Care | Payer: Medicaid Other | Attending: Emergency Medicine | Admitting: Emergency Medicine

## 2016-06-20 ENCOUNTER — Emergency Department (HOSPITAL_COMMUNITY): Payer: Medicaid Other

## 2016-06-20 ENCOUNTER — Encounter (HOSPITAL_COMMUNITY): Payer: Self-pay | Admitting: Emergency Medicine

## 2016-06-20 DIAGNOSIS — F17200 Nicotine dependence, unspecified, uncomplicated: Secondary | ICD-10-CM | POA: Diagnosis not present

## 2016-06-20 DIAGNOSIS — R109 Unspecified abdominal pain: Secondary | ICD-10-CM

## 2016-06-20 DIAGNOSIS — Z7982 Long term (current) use of aspirin: Secondary | ICD-10-CM | POA: Diagnosis not present

## 2016-06-20 DIAGNOSIS — I10 Essential (primary) hypertension: Secondary | ICD-10-CM | POA: Insufficient documentation

## 2016-06-20 DIAGNOSIS — Z79899 Other long term (current) drug therapy: Secondary | ICD-10-CM | POA: Diagnosis not present

## 2016-06-20 LAB — URINALYSIS, ROUTINE W REFLEX MICROSCOPIC
BILIRUBIN URINE: NEGATIVE
Bacteria, UA: NONE SEEN
GLUCOSE, UA: NEGATIVE mg/dL
Ketones, ur: NEGATIVE mg/dL
LEUKOCYTES UA: NEGATIVE
NITRITE: NEGATIVE
Protein, ur: NEGATIVE mg/dL
SPECIFIC GRAVITY, URINE: 1.016 (ref 1.005–1.030)
pH: 5 (ref 5.0–8.0)

## 2016-06-20 LAB — COMPREHENSIVE METABOLIC PANEL
ALK PHOS: 82 U/L (ref 38–126)
ALT: 20 U/L (ref 14–54)
ANION GAP: 9 (ref 5–15)
AST: 20 U/L (ref 15–41)
Albumin: 3.9 g/dL (ref 3.5–5.0)
BILIRUBIN TOTAL: 0.5 mg/dL (ref 0.3–1.2)
BUN: 5 mg/dL — ABNORMAL LOW (ref 6–20)
CALCIUM: 9.1 mg/dL (ref 8.9–10.3)
CO2: 21 mmol/L — ABNORMAL LOW (ref 22–32)
Chloride: 107 mmol/L (ref 101–111)
Creatinine, Ser: 0.82 mg/dL (ref 0.44–1.00)
GFR calc Af Amer: >60 mL/min (ref >60)
GLUCOSE: 86 mg/dL (ref 65–99)
POTASSIUM: 3.7 mmol/L (ref 3.5–5.1)
Sodium: 137 mmol/L (ref 135–145)
TOTAL PROTEIN: 7.4 g/dL (ref 6.5–8.1)

## 2016-06-20 LAB — LIPASE, BLOOD: Lipase: 27 U/L (ref 11–51)

## 2016-06-20 LAB — PREGNANCY, URINE: Preg Test, Ur: NEGATIVE

## 2016-06-20 LAB — CBC
HEMATOCRIT: 41.8 % (ref 36.0–46.0)
HEMOGLOBIN: 14.2 g/dL (ref 12.0–15.0)
MCH: 31.6 pg (ref 26.0–34.0)
MCHC: 34 g/dL (ref 30.0–36.0)
MCV: 92.9 fL (ref 78.0–100.0)
Platelets: 371 10*3/uL (ref 150–400)
RBC: 4.5 MIL/uL (ref 3.87–5.11)
RDW: 13.4 % (ref 11.5–15.5)
WBC: 7.8 10*3/uL (ref 4.0–10.5)

## 2016-06-20 MED ORDER — KETOROLAC TROMETHAMINE 60 MG/2ML IM SOLN
30.0000 mg | Freq: Once | INTRAMUSCULAR | Status: AC
  Administered 2016-06-20: 30 mg via INTRAMUSCULAR
  Filled 2016-06-20: qty 2

## 2016-06-20 MED ORDER — METHOCARBAMOL 500 MG PO TABS: 500.0000 mg | ORAL_TABLET | Freq: Two times a day (BID) | ORAL | 0 refills | Status: DC

## 2016-06-20 MED ORDER — NAPROXEN 500 MG PO TABS: 500.0000 mg | ORAL_TABLET | Freq: Two times a day (BID) | ORAL | 0 refills | Status: DC

## 2016-06-20 NOTE — ED Notes (Signed)
Patient transported to CT 

## 2016-06-20 NOTE — ED Provider Notes (Signed)
Kathryn Larson Provider Note   CSN: [id number] Arrival date & time: 06/20/16  1507     History   Chief Complaint Chief Complaint  Patient presents with  . Flank Pain    HPI Kathryn Larson is a 46 y.o. female.  HPI Kathryn Larson is a 46 y.o. female with history of anxiety, bipolar disorder, acid reflux, ovarian cysts, painful periods, presents to emergency department complaining of right flank pain. Patient reports flank pain started yesterday. States she started across entire back, and today it is more localized to the right side. She denies any associated nausea or vomiting. Denies any urinary symptoms but states her urine looks dark. She denies any abdominal pain. She states "my menstrual cycle is about to start." She states she has very painful periods but this does not feel the same. She has been taking Tylenol with Codeine with no relief of her pain. She denies any injuries but does state that she did laundry which requires some lifting and bending forward. She denies pain in her extremities. No numbness or weakness in extremities. No vaginal discharge or bleeding at this time.  Past Medical History:  Diagnosis Date  . Anxiety   . Bipolar affective ([place])   . Bipolar disorder, unspecified ([place]) 03/26/2013  . Calculus of gallbladder with acute cholecystitis, without mention of obstruction 03/26/2013  . Carpal tunnel syndrome   . GERD (gastroesophageal reflux disease) 1994  . GERD (gastroesophageal reflux disease) 03/26/2013  . Obesity   . Obesity, Class III, BMI 40-49.9 (morbid obesity) ([place]) 03/26/2013  . Ovarian cyst   . Panic attacks   . Rectal bleed    Hemorrhoids  . Seizures ([place])    last seizure in May 2015    Patient Active Problem List   Diagnosis Date Noted  . Essential hypertension 11/19/2015  . Ganglion cyst 11/19/2015  . Frequent falls 11/19/2015  . Pain in joint, ankle and foot 11/19/2015  . Calculus of gallbladder with acute cholecystitis, without  mention of obstruction 03/26/2013  . GERD (gastroesophageal reflux disease) 03/26/2013  . Bipolar disorder, unspecified ([place]) 03/26/2013  . Obesity, Class III, BMI 40-49.9 (morbid obesity) ([place]) 03/26/2013    Past Surgical History:  Procedure Laterality Date  . ANKLE FRACTURE SURGERY     Bilateral - right 2013, left 2010  . CHOLECYSTECTOMY N/A 03/24/2013   Procedure: LAPAROSCOPIC CHOLECYSTECTOMY;  Surgeon: [name], MD;  Location: [facility] OR;  Service: General;  Laterality: N/A;  . ECTOPIC PREGNANCY SURGERY      OB History    Gravida Para Term Preterm AB Living   6 2 2   4 2    SAB TAB Ectopic Multiple Live Births   4               Home Medications    Prior to Admission medications   Medication Sig Start Date End Date Taking? Authorizing Provider  acetaminophen (TYLENOL) 500 MG tablet Take 1,000 mg by mouth every 6 (six) hours as needed for headache.    Historical Provider, MD  acetaminophen-codeine (TYLENOL #3) 300-30 MG tablet Take 1 tablet by mouth every 12 (twelve) hours as needed for moderate pain. 11/19/15   Arnoldo Morale, MD  ALPRAZolam Duanne Moron) 1 MG tablet Take 1 mg by mouth 3 (three) times daily.    Historical Provider, MD  Aspirin-Acetaminophen-Caffeine (GOODY HEADACHE PO) Take 1 packet by mouth daily as needed (pain).    Historical Provider, MD  cyclobenzaprine (FLEXERIL) 10 MG tablet TAKE 1 TABLET BY MOUTH  EVERY 12 HOURS AS NEEDED FOR MUSCLE SPASMS 11/19/15   Arnoldo Morale, MD  divalproex (DEPAKOTE) 250 MG DR tablet Take 250 mg by mouth 2 (two) times daily.    Historical Provider, MD  lisinopril (PRINIVIL,ZESTRIL) 5 MG tablet TAKE 1 TABLET BY MOUTH ONCE DAILY 06/15/16   Arnoldo Morale, MD  omeprazole (PRILOSEC) 20 MG capsule Take 1 capsule (20 mg total) by mouth daily. 11/19/15   Arnoldo Morale, MD  Phenyleph-CPM-DM-APAP (TYLENOL COLD MULTI-SYMPTOM) 06-28-08-325 MG TABS Take 2 tablets by mouth daily as needed (for cold).    Historical Provider, MD  sertraline (ZOLOFT) 100 MG tablet  Take 100 mg by mouth 3 (three) times daily.    Historical Provider, MD    Family History Family History  Problem Relation Age of Onset  . Diabetes Mother   . Diabetes Sister   . Cancer Maternal Grandmother     Social History Social History  Substance Use Topics  . Smoking status: Current Every Day Smoker    Last attempt to quit: 02/27/2010  . Smokeless tobacco: Not on file  . Alcohol use No     Allergies   Patient has no known allergies.   Review of Systems Review of Systems  Constitutional: Negative for chills and fever.  Respiratory: Negative for cough, chest tightness and shortness of breath.   Cardiovascular: Negative for chest pain, palpitations and leg swelling.  Gastrointestinal: Negative for abdominal pain, diarrhea, nausea and vomiting.  Genitourinary: Positive for flank pain. Negative for dysuria, frequency, hematuria, pelvic pain, vaginal bleeding, vaginal discharge and vaginal pain.  Musculoskeletal: Negative for arthralgias, myalgias, neck pain and neck stiffness.  Skin: Negative for rash.  Neurological: Negative for dizziness, weakness and headaches.  All other systems reviewed and are negative.    Physical Exam Updated Vital Signs BP (!) 111/47 (BP Location: Left Arm)   Pulse 77   Temp 98.8 F (37.1 C) (Oral)   Resp 16   SpO2 98%   Physical Exam  Constitutional: She appears well-developed and well-nourished. No distress.  HENT:  Head: Normocephalic.  Eyes: Conjunctivae are normal.  Neck: Neck supple.  Cardiovascular: Normal rate, regular rhythm and normal heart sounds.   Pulmonary/Chest: Effort normal and breath sounds normal. No respiratory distress. She has no wheezes. She has no rales.  Abdominal: Soft. Bowel sounds are normal. She exhibits no distension. There is no tenderness. There is no guarding.  Right perispinal lumbar tenderness. No swelling, erythema, discoloration. Right CVA tenderness.   Musculoskeletal: She exhibits no edema.    Neurological: She is alert.  Skin: Skin is warm and dry.  Psychiatric: She has a normal mood and affect. Her behavior is normal.  Nursing note and vitals reviewed.    ED Treatments / Results  Labs (all labs ordered are listed, but only abnormal results are displayed) Labs Reviewed  COMPREHENSIVE METABOLIC PANEL - Abnormal; Notable for the following:       Result Value   CO2 21 (*)    BUN 5 (*)    All other components within normal limits  URINALYSIS, ROUTINE W REFLEX MICROSCOPIC - Abnormal; Notable for the following:    Hgb urine dipstick MODERATE (*)    Squamous Epithelial / LPF 0-5 (*)    All other components within normal limits  LIPASE, BLOOD  CBC  PREGNANCY, URINE    EKG  EKG Interpretation None       Radiology Ct Renal Stone Study  Result Date: 06/20/2016 CLINICAL DATA:  Severe periumbilical pain, nausea  and vomiting for 2 days. History of cholecystectomy, ectopic pregnancy. EXAM: CT ABDOMEN AND PELVIS WITHOUT CONTRAST TECHNIQUE: Multidetector CT imaging of the abdomen and pelvis was performed following the standard protocol without IV contrast. COMPARISON:  CT abdomen and pelvis March 23, 2013 FINDINGS: LOWER CHEST: Lung bases are clear. The visualized heart size is normal. No pericardial effusion. HEPATOBILIARY: Normal noncontrast CT liver. Status post cholecystectomy. PANCREAS: Normal. SPLEEN: Normal. ADRENALS/URINARY TRACT: Kidneys are orthotopic, demonstrating normal size and morphology. No nephrolithiasis, hydronephrosis; limited assessment for renal masses on this nonenhanced examination. The unopacified ureters are normal in course and caliber. Urinary bladder is partially distended and unremarkable. Normal adrenal glands. STOMACH/BOWEL: The stomach, small and large bowel are normal in course and caliber without inflammatory changes, sensitivity decreased by lack of enteric contrast. Normal appendix. VASCULAR/LYMPHATIC: Aortoiliac vessels are normal in course and  caliber, mild calcific atherosclerosis. No lymphadenopathy by CT size criteria. REPRODUCTIVE: Normal. OTHER: No intraperitoneal free fluid or free air. MUSCULOSKELETAL: Non-acute. Gluteal injection granulomas. Moderate sacroiliac osteoarthrosis. Moderate lower lumbar facet arthropathy. IMPRESSION: No nephrolithiasis, hydronephrosis nor acute intra-abdominal/ pelvic process. Mild atherosclerosis. Electronically Signed   By: Elon Alas M.D.   On: 06/20/2016 21:02    Procedures Procedures (including critical care time)  Medications Ordered in ED Medications - No data to display   Initial Impression / Assessment and Plan / ED Course  I have reviewed the triage vital signs and the nursing notes.  Pertinent labs & imaging results that were available during my care of the patient were reviewed by me and considered in my medical decision making (see chart for details).     Patient in emergency department with right flank pain. On exam she does have right CVA tenderness but also just tenderness to palpation over right paraspinal muscles. Pain is worsened with movements. We'll get urinalysis, labs.   The patient's urinalysis shows moderate hemoglobin. She denies being on her menstrual cycle at this time. Her renal function is normal. We will get CT for further evaluation and to rule out a kidney stone.   CT is negative. Will treat as musculoskeletal pain. Will give Robaxin and NSAIDs. Advised to try heating pads, stretches, follow-up.  Vitals:   06/20/16 1519 06/20/16 1728 06/20/16 1857  BP: 112/83 (!) 84/56 (!) 111/47  Pulse: 77 81 77  Resp: 20 16 16   Temp: 98.7 F (37.1 C) 98 F (36.7 C) 98.8 F (37.1 C)  TempSrc: Oral Oral Oral  SpO2: 99% 100% 98%     Final Clinical Impressions(s) / ED Diagnoses   Final diagnoses:  Flank pain    New Prescriptions Discharge Medication List as of 06/20/2016  9:38 PM    START taking these medications   Details  methocarbamol (ROBAXIN)  500 MG tablet Take 1 tablet (500 mg total) by mouth 2 (two) times daily., Starting Tue 06/20/2016, Print    naproxen (NAPROSYN) 500 MG tablet Take 1 tablet (500 mg total) by mouth 2 (two) times daily., Starting Tue 06/20/2016, Print         Jeannett Senior, PA-C 06/21/16 0000    Fatima Blank, MD 06/21/16 [phone]

## 2016-06-20 NOTE — Discharge Instructions (Signed)
Take robaxin as prescribed for muscle spasms. Take naprosyn for pain. Follow up with your family doctor if not improving. Return if fever, worsening symptoms, any new concerning symptoms.

## 2016-06-20 NOTE — ED Triage Notes (Signed)
Pt c/o of right sided flank pain that started yesterday pt also reports "dark colored urine".

## 2016-07-04 ENCOUNTER — Ambulatory Visit: Payer: Medicaid Other | Admitting: Family Medicine

## 2016-07-25 ENCOUNTER — Encounter: Payer: Self-pay | Admitting: Obstetrics

## 2016-07-25 ENCOUNTER — Ambulatory Visit (INDEPENDENT_AMBULATORY_CARE_PROVIDER_SITE_OTHER): Payer: Medicaid Other | Admitting: Obstetrics

## 2016-07-25 ENCOUNTER — Other Ambulatory Visit (HOSPITAL_COMMUNITY)
Admission: RE | Admit: 2016-07-25 | Discharge: 2016-07-25 | Disposition: A | Discharge status: Home / Self Care | Payer: Medicaid Other | Source: Ambulatory Visit | Attending: Obstetrics | Admitting: Obstetrics

## 2016-07-25 VITALS — BP 107/74 | HR 92 | Ht 63.0 in | Wt 267.0 lb

## 2016-07-25 DIAGNOSIS — N944 Primary dysmenorrhea: Secondary | ICD-10-CM

## 2016-07-25 DIAGNOSIS — N898 Other specified noninflammatory disorders of vagina: Secondary | ICD-10-CM

## 2016-07-25 DIAGNOSIS — Z0000 Encounter for general adult medical examination without abnormal findings: Secondary | ICD-10-CM

## 2016-07-25 DIAGNOSIS — Z01419 Encounter for gynecological examination (general) (routine) without abnormal findings: Secondary | ICD-10-CM | POA: Insufficient documentation

## 2016-07-25 DIAGNOSIS — Z1239 Encounter for other screening for malignant neoplasm of breast: Secondary | ICD-10-CM

## 2016-07-25 DIAGNOSIS — Z113 Encounter for screening for infections with a predominantly sexual mode of transmission: Secondary | ICD-10-CM

## 2016-07-25 MED ORDER — IBUPROFEN 800 MG PO TABS: 800.0000 mg | ORAL_TABLET | Freq: Three times a day (TID) | ORAL | 5 refills | Status: DC | PRN

## 2016-07-25 NOTE — Progress Notes (Signed)
Pt c/o dysmenorrhea and menorrhagia also presents for Annual, pap, and all STD blood work today. Last [facility] over a year ago. Normal pap 09/17/14.

## 2016-07-25 NOTE — Progress Notes (Signed)
Subjective:        Kathryn Larson is a 46 y.o. female here for a routine exam.  Current complaints: Painful periods.    Personal health questionnaire:  Is patient Ashkenazi Jewish, have a family history of breast and/or ovarian cancer: no Is there a family history of uterine cancer diagnosed at age < 60, gastrointestinal cancer, urinary tract cancer, family member who is a Field seismologist syndrome-associated carrier: no Is the patient overweight and hypertensive, family history of diabetes, personal history of gestational diabetes, preeclampsia or PCOS: no Is patient over 37, have PCOS,  family history of premature CHD under age 21, diabetes, smoke, have hypertension or peripheral artery disease:  no At any time, has a partner hit, kicked or otherwise hurt or frightened you?: no Over the past 2 weeks, have you felt down, depressed or hopeless?: no Over the past 2 weeks, have you felt little interest or pleasure in doing things?:no   Gynecologic History Patient's last menstrual period was 07/24/2016. Contraception: none Last Pap: 2016. Results were: normal Last mammogram: 2014. Results were: normal  Obstetric History OB History  Gravida Para Term Preterm AB Living  6 2 2   4 2   SAB TAB Ectopic Multiple Live Births  4            # Outcome Date GA Lbr Len/2nd Weight Sex Delivery Anes PTL Lv  6 SAB           5 SAB           4 SAB           3 SAB           2 Term           1 Term               Past Medical History:  Diagnosis Date  . Anxiety   . Bipolar affective ([place])   . Bipolar disorder, unspecified ([place]) 03/26/2013  . Calculus of gallbladder with acute cholecystitis, without mention of obstruction 03/26/2013  . Carpal tunnel syndrome   . GERD (gastroesophageal reflux disease) 1994  . GERD (gastroesophageal reflux disease) 03/26/2013  . Obesity   . Obesity, Class III, BMI 40-49.9 (morbid obesity) ([place]) 03/26/2013  . Ovarian cyst   . Panic attacks   . Rectal bleed    Hemorrhoids  . Seizures ([place])    last seizure in May 2015    Past Surgical History:  Procedure Laterality Date  . ANKLE FRACTURE SURGERY     Bilateral - right 2013, left 2010  . CHOLECYSTECTOMY N/A 03/24/2013   Procedure: LAPAROSCOPIC CHOLECYSTECTOMY;  Surgeon: [name], MD;  Location: [facility];  Service: General;  Laterality: N/A;  . ECTOPIC PREGNANCY SURGERY       Current Outpatient Prescriptions:  .  acetaminophen (TYLENOL) 500 MG tablet, Take 1,000 mg by mouth every 6 (six) hours as needed for headache., Disp: , Rfl:  .  acetaminophen-codeine (TYLENOL #3) 300-30 MG tablet, Take 1 tablet by mouth every 12 (twelve) hours as needed for moderate pain., Disp: 60 tablet, Rfl: 1 .  ALPRAZolam (XANAX) 1 MG tablet, Take 1 mg by mouth 3 (three) times daily., Disp: , Rfl:  .  Aspirin-Acetaminophen-Caffeine (GOODY HEADACHE PO), Take 1 packet by mouth daily as needed (pain)., Disp: , Rfl:  .  cyclobenzaprine (FLEXERIL) 10 MG tablet, TAKE 1 TABLET BY MOUTH EVERY 12 HOURS AS NEEDED FOR MUSCLE SPASMS, Disp: 60 tablet, Rfl: 3 .  divalproex (DEPAKOTE)  250 MG DR tablet, Take 250 mg by mouth 2 (two) times daily., Disp: , Rfl:  .  lisinopril (PRINIVIL,ZESTRIL) 5 MG tablet, TAKE 1 TABLET BY MOUTH ONCE DAILY, Disp: 30 tablet, Rfl: 0 .  methocarbamol (ROBAXIN) 500 MG tablet, Take 1 tablet (500 mg total) by mouth 2 (two) times daily., Disp: 20 tablet, Rfl: 0 .  naproxen (NAPROSYN) 500 MG tablet, Take 1 tablet (500 mg total) by mouth 2 (two) times daily., Disp: 30 tablet, Rfl: 0 .  omeprazole (PRILOSEC) 20 MG capsule, Take 1 capsule (20 mg total) by mouth daily., Disp: 30 capsule, Rfl: 5 .  Phenyleph-CPM-DM-APAP (TYLENOL COLD MULTI-SYMPTOM) 06-28-08-325 MG TABS, Take 2 tablets by mouth daily as needed (for cold)., Disp: , Rfl:  .  sertraline (ZOLOFT) 100 MG tablet, Take 100 mg by mouth 3 (three) times daily., Disp: , Rfl:  .  escitalopram (LEXAPRO) 20 MG tablet, TK 1 T PO QD FOR DEPRESSION, Disp: , Rfl: 0 .   prazosin (MINIPRESS) 5 MG capsule, TK ONE C PO QD, Disp: , Rfl: 0 .  zolpidem (AMBIEN) 10 MG tablet, TK 1 T PO QD HS, Disp: , Rfl: 0 No Known Allergies  Social History  Substance Use Topics  . Smoking status: Current Every Day Smoker    Types: Cigarettes  . Smokeless tobacco: Never Used  . Alcohol use 0.0 oz/week     Comment: occ.    Family History  Problem Relation Age of Onset  . Diabetes Mother   . Diabetes Sister   . Cancer Maternal Grandmother       Review of Systems  Constitutional: negative for fatigue and weight loss Respiratory: negative for cough and wheezing Cardiovascular: negative for chest pain, fatigue and palpitations Gastrointestinal: negative for abdominal pain and change in bowel habits Musculoskeletal:negative for myalgias Neurological: negative for gait problems and tremors Behavioral/Psych: negative for abusive relationship, depression Endocrine: negative for temperature intolerance    Genitourinary:negative for abnormal menstrual periods, genital lesions, hot flashes, sexual problems and vaginal discharge Integument/breast: negative for breast lump, breast tenderness, nipple discharge and skin lesion(s)    Objective:       BP 107/74   Pulse 92   Ht 5' 3"  (1.6 m)   Wt 267 lb (121.1 kg)   LMP 07/24/2016   BMI 47.30 kg/m  General:   alert  Skin:   no rash or abnormalities  Lungs:   clear to auscultation bilaterally  Heart:   regular rate and rhythm, S1, S2 normal, no murmur, click, rub or gallop  Breasts:   normal without suspicious masses, skin or nipple changes or axillary nodes  Abdomen:  normal findings: no organomegaly, soft, non-tender and no hernia  Pelvis:  External genitalia: normal general appearance Urinary system: urethral meatus normal and bladder without fullness, nontender Vaginal: normal without tenderness, induration or masses Cervix: normal appearance Adnexa: normal bimanual exam Uterus: anteverted and non-tender, normal size    Lab Review Urine pregnancy test Labs reviewed yes Radiologic studies reviewed yes  50% of 20 min visit spent on counseling and coordination of care.    Assessment:    Healthy female exam.    Dysmenorrhea  Obesity   Plan:   Ibuprofen Rx Recommended dietary changes and exercise as part of a weight loss program  Education reviewed: calcium supplements, depression evaluation, low fat, low cholesterol diet, safe sex/STD prevention, self breast exams, smoking cessation and weight bearing exercise. Contraception: none. Mammogram ordered. Follow up in: 1 year.   Meds ordered  this encounter  Medications  . prazosin (MINIPRESS) 5 MG capsule    Sig: TK ONE C PO QD    Refill:  0  . escitalopram (LEXAPRO) 20 MG tablet    Sig: TK 1 T PO QD FOR DEPRESSION    Refill:  0  . zolpidem (AMBIEN) 10 MG tablet    Sig: TK 1 T PO QD HS    Refill:  0   Orders Placed This Encounter  Procedures  . Hepatitis B surface antigen  . Hepatitis C antibody  . HIV antibody  . RPR     Patient ID: Kynedi Profitt, female   DOB: 1970/09/13, 46 y.o.   MRN: [id number]

## 2016-07-25 NOTE — Patient Instructions (Addendum)
Dysmenorrhea Dysmenorrhea means painful cramps during your period (menstrual period). You will have pain in your lower belly (abdomen). The pain is caused by the tightening (contracting) of the muscles of the womb (uterus). The pain may be mild or very bad. With this condition, you may:  Have a headache.  Feel sick to your stomach (nauseous).  Throw up (vomit).  Have lower back pain. Follow these instructions at home: Helping pain and cramping   Put heat on your lower back or belly when you have pain or cramps. Use the heat source that your doctor tells you to use.  Place a towel between your skin and the heat.  Leave the heat on for 20-30 minutes.  Remove the heat if your skin turns bright red. This is especially important if you cannot feel pain, heat, or cold.  Do not have a heating pad on during sleep.  Do aerobic exercises. These include walking, swimming, or biking. These may help with cramps.  Massage your lower back or belly. This may help lessen pain. General instructions   Take over-the-counter and prescription medicines only as told by your doctor.  Do not drive or use heavy machinery while taking prescription pain medicine.  Avoid alcohol and caffeine during and right before your period. These can make cramps worse.  Do not use any products that have nicotine or tobacco. These include cigarettes and e-cigarettes. If you need help quitting, ask your doctor.  Keep all follow-up visits as told by your doctor. This is important. Contact a doctor if:  You have pain that gets worse.  You have pain that does not get better with medicine.  You have pain during sex.  You feel sick to your stomach or you throw up during your period, and medicine does not help. Get help right away if:  You pass out (faint). Summary  Dysmenorrhea means painful cramps during your period (menstrual period).  Put heat on your lower back or belly when you have pain or cramps.  Do  exercises like walking, swimming, or biking to help with cramps.  Contact a doctor if you have pain during sex. This information is not intended to replace advice given to you by your health care provider. Make sure you discuss any questions you have with your health care provider. Document Released: 05/12/2008 Document Revised: 03/02/2016 Document Reviewed: 03/02/2016 Elsevier Interactive Patient Education  2017 [employer].

## 2016-07-26 ENCOUNTER — Other Ambulatory Visit: Payer: Self-pay | Admitting: Obstetrics

## 2016-07-26 DIAGNOSIS — N760 Acute vaginitis: Principal | ICD-10-CM

## 2016-07-26 DIAGNOSIS — B9689 Other specified bacterial agents as the cause of diseases classified elsewhere: Secondary | ICD-10-CM

## 2016-07-26 LAB — CERVICOVAGINAL ANCILLARY ONLY
BACTERIAL VAGINITIS: POSITIVE — AB
CANDIDA VAGINITIS: NEGATIVE
CHLAMYDIA, DNA PROBE: NEGATIVE
Neisseria Gonorrhea: NEGATIVE
Trichomonas: NEGATIVE

## 2016-07-26 LAB — RPR: RPR: NONREACTIVE

## 2016-07-26 LAB — HEPATITIS B SURFACE ANTIGEN: Hepatitis B Surface Ag: NEGATIVE

## 2016-07-26 LAB — HIV ANTIBODY (ROUTINE TESTING W REFLEX): HIV SCREEN 4TH GENERATION: NONREACTIVE

## 2016-07-26 LAB — HEPATITIS C ANTIBODY: Hep C Virus Ab: <0.1 s/co ratio (ref 0.0–0.9)

## 2016-07-26 MED ORDER — METRONIDAZOLE 500 MG PO TABS: 500.0000 mg | ORAL_TABLET | Freq: Two times a day (BID) | ORAL | 2 refills | Status: DC

## 2016-07-28 ENCOUNTER — Other Ambulatory Visit: Payer: Self-pay | Admitting: Obstetrics

## 2016-07-28 LAB — CYTOLOGY - PAP
DIAGNOSIS: NEGATIVE
HPV: NOT DETECTED

## 2016-09-12 ENCOUNTER — Ambulatory Visit: Payer: Medicaid Other | Admitting: Family Medicine

## 2016-09-28 ENCOUNTER — Encounter: Payer: Self-pay | Admitting: Family Medicine

## 2016-09-28 ENCOUNTER — Ambulatory Visit: Payer: Medicaid Other | Admitting: Licensed Clinical Social Worker

## 2016-09-28 ENCOUNTER — Ambulatory Visit: Payer: Medicaid Other | Attending: Family Medicine | Admitting: Family Medicine

## 2016-09-28 VITALS — BP 119/88 | HR 97 | Temp 98.6°F | Resp 20 | Ht 64.0 in | Wt 261.2 lb

## 2016-09-28 DIAGNOSIS — F3132 Bipolar disorder, current episode depressed, moderate: Secondary | ICD-10-CM | POA: Diagnosis not present

## 2016-09-28 DIAGNOSIS — M62838 Other muscle spasm: Secondary | ICD-10-CM | POA: Insufficient documentation

## 2016-09-28 DIAGNOSIS — R51 Headache: Secondary | ICD-10-CM

## 2016-09-28 DIAGNOSIS — G8929 Other chronic pain: Secondary | ICD-10-CM | POA: Insufficient documentation

## 2016-09-28 DIAGNOSIS — G44229 Chronic tension-type headache, not intractable: Secondary | ICD-10-CM

## 2016-09-28 DIAGNOSIS — R519 Headache, unspecified: Secondary | ICD-10-CM | POA: Insufficient documentation

## 2016-09-28 DIAGNOSIS — F319 Bipolar disorder, unspecified: Secondary | ICD-10-CM | POA: Insufficient documentation

## 2016-09-28 DIAGNOSIS — Z6841 Body Mass Index (BMI) 40.0 and over, adult: Secondary | ICD-10-CM | POA: Insufficient documentation

## 2016-09-28 DIAGNOSIS — Z79899 Other long term (current) drug therapy: Secondary | ICD-10-CM | POA: Insufficient documentation

## 2016-09-28 DIAGNOSIS — M545 Low back pain: Secondary | ICD-10-CM | POA: Diagnosis not present

## 2016-09-28 DIAGNOSIS — K219 Gastro-esophageal reflux disease without esophagitis: Secondary | ICD-10-CM | POA: Diagnosis not present

## 2016-09-28 DIAGNOSIS — R569 Unspecified convulsions: Secondary | ICD-10-CM | POA: Insufficient documentation

## 2016-09-28 DIAGNOSIS — F419 Anxiety disorder, unspecified: Secondary | ICD-10-CM | POA: Diagnosis not present

## 2016-09-28 DIAGNOSIS — R296 Repeated falls: Secondary | ICD-10-CM | POA: Insufficient documentation

## 2016-09-28 DIAGNOSIS — I10 Essential (primary) hypertension: Secondary | ICD-10-CM | POA: Diagnosis not present

## 2016-09-28 MED ORDER — TOPIRAMATE 50 MG PO TABS: 50.0000 mg | ORAL_TABLET | Freq: Two times a day (BID) | ORAL | 5 refills | Status: DC

## 2016-09-28 MED ORDER — OMEPRAZOLE 20 MG PO CPDR: 20.0000 mg | DELAYED_RELEASE_CAPSULE | Freq: Every day | ORAL | 5 refills | Status: DC

## 2016-09-28 MED ORDER — CYCLOBENZAPRINE HCL 10 MG PO TABS: ORAL_TABLET | ORAL | 3 refills | Status: DC

## 2016-09-28 MED ORDER — ACETAMINOPHEN-CODEINE #3 300-30 MG PO TABS: 1.0000 | ORAL_TABLET | Freq: Two times a day (BID) | ORAL | 1 refills | Status: DC | PRN

## 2016-09-28 MED ORDER — LISINOPRIL 5 MG PO TABS: 5.0000 mg | ORAL_TABLET | Freq: Every day | ORAL | 5 refills | Status: DC

## 2016-09-28 NOTE — BH Specialist Note (Signed)
Integrated Behavioral Health Initial Visit  MRN: [id number] Name: Kathryn Larson   Session Start time: 11:45 AM Session End time: 12:15 PM Total time: 30 minutes  Type of Service: [place] Interpretor:No. Interpretor Name and Language: N/A   Warm Hand Off Completed.       SUBJECTIVE: Kathryn Larson is a 46 y.o. female accompanied by patient. Patient was referred by Dr. Jarold Song for depression and anxiety. Patient reports the following symptoms/concerns: overwhelming feelings of sadness and worry, difficulty sleeping, low energy, difficulty concentrating, and irritability Duration of problem: Ongoing; Severity of problem: severe  OBJECTIVE: Mood: Pleasant and Affect: Appropriate Risk of harm to self or others: No plan to harm self or others   LIFE CONTEXT: Family and Social: Pt receives support from her adult children School/Work: Pt receives disability ($750) and food stamps ($170) Self-Care: Pt receives in-home services to assist with medical needs. She has a Transport planner and peer support counselor through [employer]. Pt participates in medication management and prays often Life Changes: Pt's father is hospitalized. Pt travels often to [place] to visit him  GOALS ADDRESSED: Patient will reduce symptoms of: anxiety and depression and increase knowledge and/or ability of: coping skills and also: Increase adequate support systems for patient/family   INTERVENTIONS: Solution-Focused Strategies, Supportive Counseling, Psychoeducation and/or Health Education and Link to [employer]  Standardized Assessments completed: GAD-7 and PHQ 2&9  ASSESSMENT: Patient currently experiencing depression and anxiety triggered by ongoing medical concerns and stress regarding father's health. She reports overwhelming feelings of sadness and worry, difficulty sleeping, low energy, difficulty concentrating, and irritability. Patient participates in  medication management and counseling through [facility]. She also has in-home services (PCS) in place. She denies SI/HI/AVH. LCSWA educated pt on the negative impact stress can have on one's physical and mental health. LCSWA discussed benefits of applying healthy coping skills to decrease symptoms. Pt successfully identified healthy strategies to implement on a daily basis to decrease symptoms. LCSWA provided resources on crisis intervention and food insecurity.   PLAN: 1. Follow up with behavioral health clinician on : Pt was encouraged to contact LCSWA if symptoms worsen or fail to improve to schedule behavioral appointments at [facility]. 2. Behavioral recommendations: LCSWA recommends that pt apply healthy coping skills discussed. Pt is encouraged to schedule follow up appointment with LCSWA 3. Referral(s): Community Resources:  Food 4. "From scale of 1-10, how likely are you to follow plan?": 8/10  Rebekah [place], LCSW 10/02/16 12:01 PM

## 2016-09-28 NOTE — Progress Notes (Signed)
Subjective:  Patient ID: Kathryn Larson, female    DOB: 05-17-1970  Age: 46 y.o. MRN: [id number]  CC: Follow-up on chronic medical conditions  HPI Kathryn Larson is a 46 year old morbidly obese female with a medical history of hypertension, bipolar disorder, GERD who presents today for follow-up visit.  She is requesting refills of her chronic medications. Suffers from chronic bilateral ankle pain and is status post bilateral ankle surgery. She also has chronic low back pain ever since she took a fall in 2013 and injured her ankle. Complains of frequent falls due to instability and balance issues and ambulates with the aid of a cane. Remains on Tylenol 3 which she takes intermittently; also takes Flexeril for back muscle spasms.  She has PCS services and a nurse comes to visit every day.   Her Bipolar depression is managed by a psychiatrist and she attends therapy sessions at [facility]. Denies suicidal ideation or intent and states her depression is getting better.  She does have headaches which occur 3 times a week and did not respond to ibuprofen. Denies nausea or vomiting. Headache is worse with increased noise and light.    Past Medical History:  Diagnosis Date  . Anxiety   . Bipolar affective ([place])   . Bipolar disorder, unspecified ([place]) 03/26/2013  . Calculus of gallbladder with acute cholecystitis, without mention of obstruction 03/26/2013  . Carpal tunnel syndrome   . GERD (gastroesophageal reflux disease) 1994  . GERD (gastroesophageal reflux disease) 03/26/2013  . Obesity   . Obesity, Class III, BMI 40-49.9 (morbid obesity) ([place]) 03/26/2013  . Ovarian cyst   . Panic attacks   . Rectal bleed    Hemorrhoids  . Seizures ([place])    last seizure in May 2015    Past Surgical History:  Procedure Laterality Date  . ANKLE FRACTURE SURGERY     Bilateral - right 2013, left 2010  . CHOLECYSTECTOMY N/A 03/24/2013   Procedure: LAPAROSCOPIC CHOLECYSTECTOMY;  Surgeon: [name], MD;  Location: [facility];  Service: General;  Laterality: N/A;  . ECTOPIC PREGNANCY SURGERY      No Known Allergies   Outpatient Medications Prior to Visit  Medication Sig Dispense Refill  . ALPRAZolam (XANAX) 1 MG tablet Take 1 mg by mouth 3 (three) times daily.    . divalproex (DEPAKOTE) 250 MG DR tablet Take 250 mg by mouth 2 (two) times daily.    Marland Kitchen escitalopram (LEXAPRO) 20 MG tablet TK 1 T PO QD FOR DEPRESSION  0  . ibuprofen (ADVIL,MOTRIN) 800 MG tablet Take 1 tablet (800 mg total) by mouth every 8 (eight) hours as needed. 30 tablet 5  . prazosin (MINIPRESS) 5 MG capsule TK ONE C PO QD  0  . sertraline (ZOLOFT) 100 MG tablet Take 100 mg by mouth 3 (three) times daily.    Marland Kitchen zolpidem (AMBIEN) 10 MG tablet TK 1 T PO QD HS  0  . acetaminophen (TYLENOL) 500 MG tablet Take 1,000 mg by mouth every 6 (six) hours as needed for headache.    . cyclobenzaprine (FLEXERIL) 10 MG tablet TAKE 1 TABLET BY MOUTH EVERY 12 HOURS AS NEEDED FOR MUSCLE SPASMS 60 tablet 3  . lisinopril (PRINIVIL,ZESTRIL) 5 MG tablet TAKE 1 TABLET BY MOUTH ONCE DAILY 30 tablet 0  . methocarbamol (ROBAXIN) 500 MG tablet Take 1 tablet (500 mg total) by mouth 2 (two) times daily. 20 tablet 0  . naproxen (NAPROSYN) 500 MG tablet Take 1 tablet (500 mg total) by mouth  2 (two) times daily. 30 tablet 0  . omeprazole (PRILOSEC) 20 MG capsule Take 1 capsule (20 mg total) by mouth daily. 30 capsule 5  . Aspirin-Acetaminophen-Caffeine (GOODY HEADACHE PO) Take 1 packet by mouth daily as needed (pain).    . metroNIDAZOLE (FLAGYL) 500 MG tablet Take 1 tablet (500 mg total) by mouth 2 (two) times daily. (Patient not taking: Reported on 09/28/2016) 14 tablet 2  . Phenyleph-CPM-DM-APAP (TYLENOL COLD MULTI-SYMPTOM) 06-28-08-325 MG TABS Take 2 tablets by mouth daily as needed (for cold).    Marland Kitchen acetaminophen-codeine (TYLENOL #3) 300-30 MG tablet Take 1 tablet by mouth every 12 (twelve) hours as needed for moderate pain. (Patient not taking: Reported  on 09/28/2016) 60 tablet 1   No facility-administered medications prior to visit.     ROS Review of Systems Constitutional: Negative for activity change, appetite change and fatigue.  HENT: Negative for congestion, sinus pressure and sore throat.   Eyes: Negative for visual disturbance.  Respiratory: Negative for cough, chest tightness, shortness of breath and wheezing.   Cardiovascular: Negative for chest pain and palpitations.  Gastrointestinal: Negative for abdominal distention, abdominal pain and constipation.  Endocrine: Negative for polydipsia.  Genitourinary: Negative for dysuria and frequency.  Musculoskeletal:       See hpi  Skin: Negative for rash.  Neurological: Negative for tremors, light-headedness and numbness.  Hematological: Does not bruise/bleed easily.  Psychiatric/Behavioral: Negative for agitation and behavioral problems.   Objective:  BP 119/88 (BP Location: Left Arm, Patient Position: Sitting, Cuff Size: Large)   Pulse 97   Temp 98.6 F (37 C) (Oral)   Resp 20   Ht 5' 4"  (1.626 m)   Wt 261 lb 3.2 oz (118.5 kg)   LMP 09/21/2016   SpO2 99%   BMI 44.83 kg/m   BP/Weight 09/28/2016 07/25/2016 1/47/8295  Systolic BP 621 308 657  Diastolic BP 88 74 47  Wt. (Lbs) 261.2 267 -  BMI 44.83 47.3 -      Physical Exam Constitutional: She is oriented to person, place, and time. She appears well-developed and well-nourished.  Morbidly obese  Neck: No JVD Cardiovascular: Normal rate, normal heart sounds and intact distal pulses.   No murmur heard. Pulmonary/Chest: Effort normal and breath sounds normal. She has no wheezes. She has no rales. She exhibits no tenderness.  Abdominal: Soft. Bowel sounds are normal. She exhibits no distension and no mass. There is no tenderness.  Musculoskeletal: Tenderness on palpation of lumbar spine; negative straight leg raise  Neurological: She is alert and oriented to person, place, and time.  Psych: Normal mood   Assessment &  Plan:   1. GERD without esophagitis Controlled - omeprazole (PRILOSEC) 20 MG capsule; Take 1 capsule (20 mg total) by mouth daily.  Dispense: 30 capsule; Refill: 5  2. Bipolar affective disorder, currently depressed, moderate (HCC) Elevated pH Q9 score of 22 which is improved from 26 previously She currently is not in crisis at this time Sees psychiatry and Therapist at [facility] every week LCSW in house called in for counseling and therapy Currently on  Lexapro, Zoloft, Xanax  3. Seizures ([place]) Last seizure was last year Remains on Depakote  4. Chronic midline low back pain without sciatica High risk for falls Ambulate with the aid of a cane or walker - acetaminophen-codeine (TYLENOL #3) 300-30 MG tablet; Take 1 tablet by mouth every 12 (twelve) hours as needed for moderate pain.  Dispense: 60 tablet; Refill: 1  5. Muscle spasm Advised to apply heat -  cyclobenzaprine (FLEXERIL) 10 MG tablet; TAKE 1 TABLET BY MOUTH EVERY 12 HOURS AS NEEDED FOR MUSCLE SPASMS  Dispense: 60 tablet; Refill: 3  6. Chronic tension-type headache, not intractable Uncontrolled and ibuprofen Commenced Topamax-discussed side effects - topiramate (TOPAMAX) 50 MG tablet; Take 1 tablet (50 mg total) by mouth 2 (two) times daily.  Dispense: 60 tablet; Refill: 5  7. Essential hypertension Controlled Low-sodium diet - lisinopril (PRINIVIL,ZESTRIL) 5 MG tablet; Take 1 tablet (5 mg total) by mouth daily.  Dispense: 30 tablet; Refill: 5   Meds ordered this encounter  Medications  . cyclobenzaprine (FLEXERIL) 10 MG tablet    Sig: TAKE 1 TABLET BY MOUTH EVERY 12 HOURS AS NEEDED FOR MUSCLE SPASMS    Dispense:  60 tablet    Refill:  3  . acetaminophen-codeine (TYLENOL #3) 300-30 MG tablet    Sig: Take 1 tablet by mouth every 12 (twelve) hours as needed for moderate pain.    Dispense:  60 tablet    Refill:  1  . omeprazole (PRILOSEC) 20 MG capsule    Sig: Take 1 capsule (20 mg total) by mouth daily.     Dispense:  30 capsule    Refill:  5  . lisinopril (PRINIVIL,ZESTRIL) 5 MG tablet    Sig: Take 1 tablet (5 mg total) by mouth daily.    Dispense:  30 tablet    Refill:  5    Must have office visit for refills - 90 days not appropriate  . topiramate (TOPAMAX) 50 MG tablet    Sig: Take 1 tablet (50 mg total) by mouth 2 (two) times daily.    Dispense:  60 tablet    Refill:  5    Follow-up: Return in about 4 months (around 01/28/2017) for follow up of chronic medical conditions.   This note has been created with Surveyor, quantity. Any transcriptional errors are unintentional.     Arnoldo Morale MD

## 2016-09-28 NOTE — Patient Instructions (Signed)
General Headache Without Cause A headache is pain or discomfort felt around the head or neck area. The specific cause of a headache may not be found. There are many causes and types of headaches. A few common ones are:  Tension headaches.  Migraine headaches.  Cluster headaches.  Chronic daily headaches.  Follow these instructions at home: Watch your condition for any changes. Take these steps to help with your condition: Managing pain  Take over-the-counter and prescription medicines only as told by your health care provider.  Lie down in a dark, quiet room when you have a headache.  If directed, apply ice to the head and neck area: ? Put ice in a plastic bag. ? Place a towel between your skin and the bag. ? Leave the ice on for 20 minutes, 2-3 times per day.  Use a heating pad or hot shower to apply heat to the head and neck area as told by your health care provider.  Keep lights dim if bright lights bother you or make your headaches worse. Eating and drinking  Eat meals on a regular schedule.  Limit alcohol use.  Decrease the amount of caffeine you drink, or stop drinking caffeine. General instructions  Keep all follow-up visits as told by your health care provider. This is important.  Keep a headache journal to help find out what may trigger your headaches. For example, write down: ? What you eat and drink. ? How much sleep you get. ? Any change to your diet or medicines.  Try massage or other relaxation techniques.  Limit stress.  Sit up straight, and do not tense your muscles.  Do not use tobacco products, including cigarettes, chewing tobacco, or e-cigarettes. If you need help quitting, ask your health care provider.  Exercise regularly as told by your health care provider.  Sleep on a regular schedule. Get 7-9 hours of sleep, or the amount recommended by your health care provider. Contact a health care provider if:  Your symptoms are not helped by  medicine.  You have a headache that is different from the usual headache.  You have nausea or you vomit.  You have a fever. Get help right away if:  Your headache becomes severe.  You have repeated vomiting.  You have a stiff neck.  You have a loss of vision.  You have problems with speech.  You have pain in the eye or ear.  You have muscular weakness or loss of muscle control.  You lose your balance or have trouble walking.  You feel faint or pass out.  You have confusion. This information is not intended to replace advice given to you by your health care provider. Make sure you discuss any questions you have with your health care provider. Document Released: 02/13/2005 Document Revised: 07/22/2015 Document Reviewed: 06/08/2014 Elsevier Interactive Patient Education  2017 [employer].

## 2016-09-28 NOTE — Progress Notes (Signed)
Medication RF Muscle spasms BP medication

## 2016-11-15 ENCOUNTER — Encounter: Payer: Self-pay | Admitting: Obstetrics

## 2017-01-29 ENCOUNTER — Ambulatory Visit: Payer: Medicaid Other | Admitting: Family Medicine

## 2017-03-12 ENCOUNTER — Other Ambulatory Visit: Payer: Self-pay | Admitting: Family Medicine

## 2017-03-12 DIAGNOSIS — M62838 Other muscle spasm: Secondary | ICD-10-CM

## 2017-05-03 ENCOUNTER — Other Ambulatory Visit: Payer: Self-pay | Admitting: Family Medicine

## 2017-05-03 DIAGNOSIS — G44229 Chronic tension-type headache, not intractable: Secondary | ICD-10-CM

## 2017-06-07 ENCOUNTER — Other Ambulatory Visit: Payer: Self-pay | Admitting: Family Medicine

## 2017-06-07 DIAGNOSIS — I10 Essential (primary) hypertension: Secondary | ICD-10-CM

## 2017-06-07 DIAGNOSIS — G44229 Chronic tension-type headache, not intractable: Secondary | ICD-10-CM

## 2017-07-16 ENCOUNTER — Ambulatory Visit: Payer: Medicaid Other | Admitting: Family Medicine

## 2017-09-18 ENCOUNTER — Ambulatory Visit: Payer: Medicaid Other | Admitting: Family Medicine

## 2017-09-24 ENCOUNTER — Ambulatory Visit: Payer: Medicaid Other | Admitting: Family Medicine

## 2017-11-07 ENCOUNTER — Ambulatory Visit: Payer: Medicaid Other | Admitting: Obstetrics

## 2017-11-19 ENCOUNTER — Ambulatory Visit: Payer: Medicaid Other | Admitting: Family Medicine

## 2017-11-21 ENCOUNTER — Ambulatory Visit: Payer: Medicaid Other | Admitting: Obstetrics

## 2017-12-04 ENCOUNTER — Other Ambulatory Visit (HOSPITAL_COMMUNITY)
Admission: RE | Admit: 2017-12-04 | Discharge: 2017-12-04 | Disposition: A | Discharge status: Home / Self Care | Payer: Medicaid Other | Source: Ambulatory Visit | Attending: Obstetrics | Admitting: Obstetrics

## 2017-12-04 ENCOUNTER — Ambulatory Visit: Payer: Medicaid Other | Admitting: Obstetrics

## 2017-12-04 ENCOUNTER — Encounter: Payer: Self-pay | Admitting: Obstetrics

## 2017-12-04 VITALS — BP 94/66 | HR 82 | Ht 64.0 in

## 2017-12-04 DIAGNOSIS — Z01419 Encounter for gynecological examination (general) (routine) without abnormal findings: Secondary | ICD-10-CM | POA: Insufficient documentation

## 2017-12-04 DIAGNOSIS — Z1239 Encounter for other screening for malignant neoplasm of breast: Secondary | ICD-10-CM

## 2017-12-04 DIAGNOSIS — F17210 Nicotine dependence, cigarettes, uncomplicated: Secondary | ICD-10-CM

## 2017-12-04 DIAGNOSIS — N898 Other specified noninflammatory disorders of vagina: Secondary | ICD-10-CM

## 2017-12-04 DIAGNOSIS — Z0000 Encounter for general adult medical examination without abnormal findings: Secondary | ICD-10-CM

## 2017-12-04 DIAGNOSIS — Z1272 Encounter for screening for malignant neoplasm of vagina: Secondary | ICD-10-CM | POA: Diagnosis not present

## 2017-12-04 DIAGNOSIS — Z113 Encounter for screening for infections with a predominantly sexual mode of transmission: Secondary | ICD-10-CM

## 2017-12-04 DIAGNOSIS — Z6841 Body Mass Index (BMI) 40.0 and over, adult: Secondary | ICD-10-CM

## 2017-12-04 DIAGNOSIS — N944 Primary dysmenorrhea: Secondary | ICD-10-CM

## 2017-12-04 MED ORDER — IBUPROFEN 800 MG PO TABS: 800.0000 mg | ORAL_TABLET | Freq: Three times a day (TID) | ORAL | 5 refills | Status: DC | PRN

## 2017-12-04 NOTE — Patient Instructions (Signed)
Hysterectomy Information A hysterectomy is a surgery in which your uterus is removed. This surgery may be done to treat various medical problems. After the surgery, you will no longer have menstrual periods. The surgery will also make you unable to become pregnant (sterile). The fallopian tubes and ovaries can be removed (bilateral salpingo-oophorectomy) during this surgery as well. Reasons for a hysterectomy  Persistent, abnormal bleeding.  Lasting (chronic) pelvic pain or infection.  The lining of the uterus (endometrium) starts growing outside the uterus (endometriosis).  The endometrium starts growing in the muscle of the uterus (adenomyosis).  The uterus falls down into the vagina (pelvic organ prolapse).  Noncancerous growths in the uterus (uterine fibroids) that cause symptoms.  Precancerous cells.  Cervical cancer or uterine cancer. Types of hysterectomies  Supracervical hysterectomy-In this type, the top part of the uterus is removed, but not the cervix.  Total hysterectomy-The uterus and cervix are removed.  Radical hysterectomy-The uterus, the cervix, and the fibrous tissue that holds the uterus in place in the pelvis (parametrium) are removed. Ways a hysterectomy can be performed  Abdominal hysterectomy-A large surgical cut (incision) is made in the abdomen. The uterus is removed through this incision.  Vaginal hysterectomy-An incision is made in the vagina. The uterus is removed through this incision. There are no abdominal incisions.  Conventional laparoscopic hysterectomy-Three or four small incisions are made in the abdomen. A thin, lighted tube with a camera (laparoscope) is inserted into one of the incisions. Other tools are put through the other incisions. The uterus is cut into small pieces. The small pieces are removed through the incisions, or they are removed through the vagina.  Laparoscopically assisted vaginal hysterectomy (LAVH)-Three or four small  incisions are made in the abdomen. Part of the surgery is performed laparoscopically and part vaginally. The uterus is removed through the vagina.  Robot-assisted laparoscopic hysterectomy-A laparoscope and other tools are inserted into 3 or 4 small incisions in the abdomen. A computer-controlled device is used to give the surgeon a 3D image and to help control the surgical instruments. This allows for more precise movements of surgical instruments. The uterus is cut into small pieces and removed through the incisions or removed through the vagina. What are the risks? Possible complications associated with this procedure include:  Bleeding and risk of blood transfusion. Tell your health care provider if you do not want to receive any blood products.  Blood clots in the legs or lung.  Infection.  Injury to surrounding organs.  Problems or side effects related to anesthesia.  Conversion to an abdominal hysterectomy from one of the other techniques.  What to expect after a hysterectomy  You will be given pain medicine.  You will need to have someone with you for the first 3-5 days after you go home.  You will need to follow up with your surgeon in 2-4 weeks after surgery to evaluate your progress.  You may have early menopause symptoms such as hot flashes, night sweats, and insomnia.  If you had a hysterectomy for a problem that was not cancer or not a condition that could lead to cancer, then you no longer need Pap tests. However, even if you no longer need a Pap test, a regular exam is a good idea to make sure no other problems are starting. This information is not intended to replace advice given to you by your health care provider. Make sure you discuss any questions you have with your health care provider. Document Released:  08/09/2000 Document Revised: 07/22/2015 Document Reviewed: 10/21/2012 Elsevier Interactive Patient Education  2017 [employer]. Dysmenorrhea Menstrual cramps  (dysmenorrhea) are caused by the muscles of the uterus tightening (contracting) during a menstrual period. For some women, this discomfort is merely bothersome. For others, dysmenorrhea can be severe enough to interfere with everyday activities for a few days each month. Primary dysmenorrhea is menstrual cramps that last a couple of days when you start having menstrual periods or soon after. This often begins after a teenager starts having her period. As a woman gets older or has a baby, the cramps will usually lessen or disappear. Secondary dysmenorrhea begins later in life, lasts longer, and the pain may be stronger than primary dysmenorrhea. The pain may start before the period and last a few days after the period. What are the causes? Dysmenorrhea is usually caused by an underlying problem, such as:  The tissue lining the uterus grows outside of the uterus in other areas of the body (endometriosis).  The endometrial tissue, which normally lines the uterus, is found in or grows into the muscular walls of the uterus (adenomyosis).  The pelvic blood vessels are engorged with blood just before the menstrual period (pelvic congestive syndrome).  Overgrowth of cells (polyps) in the lining of the uterus or cervix.  Falling down of the uterus (prolapse) because of loose or stretched ligaments.  Depression.  Bladder problems, infection, or inflammation.  Problems with the intestine, a tumor, or irritable bowel syndrome.  Cancer of the female organs or bladder.  A severely tipped uterus.  A very tight opening or closed cervix.  Noncancerous tumors of the uterus (fibroids).  Pelvic inflammatory disease (PID).  Pelvic scarring (adhesions) from a previous surgery.  Ovarian cyst.  An intrauterine device (IUD) used for birth control.  What increases the risk? You may be at greater risk of dysmenorrhea if:  You are younger than age 45.  You started puberty early.  You have irregular  or heavy bleeding.  You have never given birth.  You have a family history of this problem.  You are a smoker.  What are the signs or symptoms?  Cramping or throbbing pain in your lower abdomen.  Headaches.  Lower back pain.  Nausea or vomiting.  Diarrhea.  Sweating or dizziness.  Loose stools. How is this diagnosed? A diagnosis is based on your history, symptoms, physical exam, diagnostic tests, or procedures. Diagnostic tests or procedures may include:  Blood tests.  Ultrasonography.  An examination of the lining of the uterus (dilation and curettage, D&C).  An examination inside your abdomen or pelvis with a scope (laparoscopy).  X-rays.  CT scan.  MRI.  An examination inside the bladder with a scope (cystoscopy).  An examination inside the intestine or stomach with a scope (colonoscopy, gastroscopy).  How is this treated? Treatment depends on the cause of the dysmenorrhea. Treatment may include:  Pain medicine prescribed by your health care provider.  Birth control pills or an IUD with progesterone hormone in it.  Hormone replacement therapy.  Nonsteroidal anti-inflammatory drugs (NSAIDs). These may help stop the production of prostaglandins.  Surgery to remove adhesions, endometriosis, ovarian cyst, or fibroids.  Removal of the uterus (hysterectomy).  Progesterone shots to stop the menstrual period.  Cutting the nerves on the sacrum that go to the female organs (presacral neurectomy).  Electric current to the sacral nerves (sacral nerve stimulation).  Antidepressant medicine.  Psychiatric therapy, counseling, or group therapy.  Exercise and physical therapy.  Meditation  and yoga therapy.  Acupuncture.  Follow these instructions at home:  Only take over-the-counter or prescription medicines as directed by your health care provider.  Place a heating pad or hot water bottle on your lower back or abdomen. Do not sleep with the heating  pad.  Use aerobic exercises, walking, swimming, biking, and other exercises to help lessen the cramping.  Massage to the lower back or abdomen may help.  Stop smoking.  Avoid alcohol and caffeine. Contact a health care provider if:  Your pain does not get better with medicine.  You have pain with sexual intercourse.  Your pain increases and is not controlled with medicines.  You have abnormal vaginal bleeding with your period.  You develop nausea or vomiting with your period that is not controlled with medicine. Get help right away if: You pass out. This information is not intended to replace advice given to you by your health care provider. Make sure you discuss any questions you have with your health care provider. Document Released: 02/13/2005 Document Revised: 07/22/2015 Document Reviewed: 08/01/2012 Elsevier Interactive Patient Education  2017 [employer]  Vaginal Hysterectomy A vaginal hysterectomy is a procedure to remove all or part of the uterus through a small incision in the vagina. In this procedure, your health care provider may remove your entire uterus, including the lower end (cervix). You may need a vaginal hysterectomy to treat:  Uterine fibroids.  A condition that causes the lining of the uterus to grow in other areas (endometriosis).  Problems with pelvic support.  Cancer of the cervix, ovaries, uterus, or tissue that lines the uterus (endometrium).  Excessive (dysfunctional) uterine bleeding.  When removing your uterus, your health care provider may also remove the organs that produce eggs (ovaries) and the tubes that carry eggs to your uterus (fallopian tubes). After a vaginal hysterectomy, you will no longer be able to have a baby. You will also no longer get your menstrual period. Tell a health care provider about:  Any allergies you have.  All medicines you are taking, including vitamins, herbs, eye drops, creams, and over-the-counter  medicines.  Any problems you or family members have had with anesthetic medicines.  Any blood disorders you have.  Any surgeries you have had.  Any medical conditions you have.  Whether you are pregnant or may be pregnant. What are the risks? Generally, this is a safe procedure. However, problems may occur, including:  Bleeding.  Infection.  A blood clot that forms in your leg and travels to your lungs (pulmonary embolism).  Damage to surrounding organs.  Pain during sex.  What happens before the procedure?  Ask your health care provider what organs will be removed during surgery.  Ask your health care provider about: ? Changing or stopping your regular medicines. This is especially important if you are taking diabetes medicines or blood thinners. ? Taking medicines such as aspirin and ibuprofen. These medicines can thin your blood. Do not take these medicines before your procedure if your health care provider instructs you not to.  Follow instructions from your health care provider about eating or drinking restrictions.  Do not use any tobacco products, such as cigarettes, chewing tobacco, and e-cigarettes. If you need help quitting, ask your health care provider.  Plan to have someone take you home after discharge from the hospital. What happens during the procedure?  To reduce your risk of infection: ? Your health care team will wash or sanitize their hands. ? Your skin will be  washed with soap.  An IV tube will be inserted into one of your veins.  You may be given antibiotic medicine to help prevent infection.  You will be given one or more of the following: ? A medicine to help you relax (sedative). ? A medicine to numb the area (local anesthetic). ? A medicine to make you fall asleep (general anesthetic). ? A medicine that is injected into an area of your body to numb everything beyond the injection site (regional anesthetic).  Your surgeon will make an  incision in your vagina.  Your surgeon will locate and remove all or part of your uterus.  Your ovaries and fallopian tubes may be removed at the same time.  The incision will be closed with stitches (sutures) that dissolve over time. The procedure may vary among health care providers and hospitals. What happens after the procedure?  Your blood pressure, heart rate, breathing rate, and blood oxygen level will be monitored often until the medicines you were given have worn off.  You will be encouraged to get up and walk around after a few hours to help prevent complications.  You may have IV tubes in place for a few days.  You will be given pain medicine as needed.  Do not drive for 24 hours if you were given a sedative. This information is not intended to replace advice given to you by your health care provider. Make sure you discuss any questions you have with your health care provider. Document Released: 06/07/2015 Document Revised: 07/22/2015 Document Reviewed: 02/28/2015 Elsevier Interactive Patient Education  [employer].

## 2017-12-04 NOTE — Progress Notes (Signed)
Subjective:        Kathryn Larson is a 47 y.o. female here for a routine exam.  Current complaints: Severe cramping with periods.  Cramps are so bad that she cannot get out of bed to go to work or do other daily functions during this 4 day course of her period.  Has tried NSAIDS and various hormonal therapies over the years without relief.  She desires definitive surgical management.   Personal health questionnaire:  Is patient Ashkenazi Jewish, have a family history of breast and/or ovarian cancer: no Is there a family history of uterine cancer diagnosed at age < 55, gastrointestinal cancer, urinary tract cancer, family member who is a Field seismologist syndrome-associated carrier: no Is the patient overweight and hypertensive, family history of diabetes, personal history of gestational diabetes, preeclampsia or PCOS: no Is patient over 29, have PCOS,  family history of premature CHD under age 4, diabetes, smoke, have hypertension or peripheral artery disease:  no At any time, has a partner hit, kicked or otherwise hurt or frightened you?: no Over the past 2 weeks, have you felt down, depressed or hopeless?: no Over the past 2 weeks, have you felt little interest or pleasure in doing things?:no   Gynecologic History Patient's last menstrual period was 11/24/2017. Contraception: none Last Pap: 2018. Results were: normal Last mammogram: unknown. Results were: unknown  Obstetric History OB History  Gravida Para Term Preterm AB Living  6 2 2   4 2   SAB TAB Ectopic Multiple Live Births  4       2    # Outcome Date GA Lbr Len/2nd Weight Sex Delivery Anes PTL Lv  6 Term 10/02/92        LIV  5 Term 04/15/87        LIV  4 SAB           3 SAB           2 SAB           1 SAB             Past Medical History:  Diagnosis Date  . Anxiety   . Bipolar affective ([place])   . Bipolar disorder, unspecified ([place]) 03/26/2013  . Calculus of gallbladder with acute cholecystitis, without mention of  obstruction 03/26/2013  . Carpal tunnel syndrome   . GERD (gastroesophageal reflux disease) 1994  . GERD (gastroesophageal reflux disease) 03/26/2013  . Obesity   . Obesity, Class III, BMI 40-49.9 (morbid obesity) ([place]) 03/26/2013  . Ovarian cyst   . Panic attacks   . Rectal bleed    Hemorrhoids  . Seizures ([place])    last seizure in May 2015    Past Surgical History:  Procedure Laterality Date  . ANKLE FRACTURE SURGERY     Bilateral - right 2013, left 2010  . CHOLECYSTECTOMY N/A 03/24/2013   Procedure: LAPAROSCOPIC CHOLECYSTECTOMY;  Surgeon: [name], MD;  Location: [facility];  Service: General;  Laterality: N/A;  . ECTOPIC PREGNANCY SURGERY       Current Outpatient Medications:  .  acetaminophen-codeine (TYLENOL #3) 300-30 MG tablet, Take 1 tablet by mouth every 12 (twelve) hours as needed for moderate pain., Disp: 60 tablet, Rfl: 1 .  ALPRAZolam (XANAX) 1 MG tablet, Take 1 mg by mouth 3 (three) times daily., Disp: , Rfl:  .  cyclobenzaprine (FLEXERIL) 10 MG tablet, TAKE 1 TABLET BY MOUTH EVERY 12 HOURS AS NEEDED FOR MUSCLE SPASMS, Disp: 60 tablet, Rfl: 0 .  divalproex (DEPAKOTE) 250 MG DR tablet, Take 250 mg by mouth 2 (two) times daily., Disp: , Rfl:  .  escitalopram (LEXAPRO) 20 MG tablet, TK 1 T PO QD FOR DEPRESSION, Disp: , Rfl: 0 .  ibuprofen (ADVIL,MOTRIN) 800 MG tablet, Take 1 tablet (800 mg total) by mouth every 8 (eight) hours as needed., Disp: 30 tablet, Rfl: 5 .  lisinopril (PRINIVIL,ZESTRIL) 5 MG tablet, Take 1 tablet (5 mg total) by mouth daily., Disp: 30 tablet, Rfl: 5 .  prazosin (MINIPRESS) 5 MG capsule, TK ONE C PO QD, Disp: , Rfl: 0 .  topiramate (TOPAMAX) 50 MG tablet, TAKE 1 TABLET BY MOUTH TWICE DAILY, Disp: 60 tablet, Rfl: 0 .  zolpidem (AMBIEN) 10 MG tablet, TK 1 T PO QD HS, Disp: , Rfl: 0 .  Aspirin-Acetaminophen-Caffeine (GOODY HEADACHE PO), Take 1 packet by mouth daily as needed (pain)., Disp: , Rfl:  .  ibuprofen (ADVIL,MOTRIN) 800 MG tablet, Take 1 tablet  (800 mg total) by mouth every 8 (eight) hours as needed., Disp: 30 tablet, Rfl: 5 .  metroNIDAZOLE (FLAGYL) 500 MG tablet, Take 1 tablet (500 mg total) by mouth 2 (two) times daily. (Patient not taking: Reported on 09/28/2016), Disp: 14 tablet, Rfl: 2 .  omeprazole (PRILOSEC) 20 MG capsule, Take 1 capsule (20 mg total) by mouth daily. (Patient not taking: Reported on 12/04/2017), Disp: 30 capsule, Rfl: 5 .  Phenyleph-CPM-DM-APAP (TYLENOL COLD MULTI-SYMPTOM) 06-28-08-325 MG TABS, Take 2 tablets by mouth daily as needed (for cold)., Disp: , Rfl:  .  sertraline (ZOLOFT) 100 MG tablet, Take 100 mg by mouth 3 (three) times daily., Disp: , Rfl:  No Known Allergies  Social History   Tobacco Use  . Smoking status: Current Every Day Smoker    Packs/day: 0.25    Types: Cigarettes  . Smokeless tobacco: Never Used  Substance Use Topics  . Alcohol use: Yes    Alcohol/week: 0.0 standard drinks    Comment: occ.    Family History  Problem Relation Age of Onset  . Diabetes Mother   . Diabetes Sister   . Cancer Maternal Grandmother       Review of Systems  Constitutional: negative for fatigue and weight loss Respiratory: negative for cough and wheezing Cardiovascular: negative for chest pain, fatigue and palpitations Gastrointestinal: negative for abdominal pain and change in bowel habits Musculoskeletal:negative for myalgias Neurological: negative for gait problems and tremors Behavioral/Psych: negative for abusive relationship, depression Endocrine: negative for temperature intolerance    Genitourinary:negative for abnormal menstrual periods, genital lesions, hot flashes, sexual problems and vaginal discharge Integument/breast: negative for breast lump, breast tenderness, nipple discharge and skin lesion(s)    Objective:       BP 94/66   Pulse 82   Ht 5' 4"  (1.626 m)   LMP 11/24/2017   BMI 44.83 kg/m  General:   alert  Skin:   no rash or abnormalities  Lungs:   clear to auscultation  bilaterally  Heart:   regular rate and rhythm, S1, S2 normal, no murmur, click, rub or gallop  Breasts:   normal without suspicious masses, skin or nipple changes or axillary nodes  Abdomen:  normal findings: no organomegaly, soft, non-tender and no hernia  Pelvis:  External genitalia: normal general appearance Urinary system: urethral meatus normal and bladder without fullness, nontender Vaginal: normal without tenderness, induration or masses Cervix: normal appearance Adnexa: normal bimanual exam Uterus: anteverted and non-tender, normal size   Lab Review Urine pregnancy test Labs reviewed yes  Radiologic studies reviewed yes  50% of 20 min visit spent on counseling and coordination of care.   Assessment:     1. Encounter for routine gynecological examination with Papanicolaou smear of cervix Rx: - Cytology - PAP  2. Primary dysmenorrhea Rx: - US PELVIC COMPLETE WITH TRANSVAGINAL; Future - ibuprofen (ADVIL,MOTRIN) 800 MG tablet; Take 1 tablet (800 mg total) by mouth every 8 (eight) hours as needed.  Dispense: 30 tablet; Refill: 5  3. Screening breast examination Rx: - MM Digital Screening; Future  4. Screening examination for STD (sexually transmitted disease) Rx: - HIV Antibody (routine testing w rflx) - RPR - Hepatitis B Surface AntiGEN - Hepatitis C Antibody  5. Vaginal discharge Rx: - Cervicovaginal ancillary only  6. Class 3 severe obesity due to excess calories without serious comorbidity with body mass index (BMI) of 40.0 to 44.9 in adult [facility]) - program of caloric reduction, exercise and behavioral modification recommended  7. Tobacco dependence due to cigarettes - tobacco cessation with the aid of medication and behavioral modification recommended     Plan:    Education reviewed: calcium supplements, depression evaluation, low fat, low cholesterol diet, safe sex/STD prevention, self breast exams, smoking cessation and weight bearing  exercise. Contraception: none. Mammogram ordered. Follow up in: 2 weeks.   Meds ordered this encounter  Medications  . ibuprofen (ADVIL,MOTRIN) 800 MG tablet    Sig: Take 1 tablet (800 mg total) by mouth every 8 (eight) hours as needed.    Dispense:  30 tablet    Refill:  5   Orders Placed This Encounter  Procedures  . US PELVIC COMPLETE WITH TRANSVAGINAL    Standing Status:   Future    Standing Expiration Date:   02/04/2019    Order Specific Question:   Reason for Exam (SYMPTOM  OR DIAGNOSIS REQUIRED)    Answer:   Severe Dysmenorrhea    Order Specific Question:   Preferred imaging location?    Answer:   [facility]  . MM Digital Screening    Standing Status:   Future    Standing Expiration Date:   02/04/2019    Order Specific Question:   Reason for Exam (SYMPTOM  OR DIAGNOSIS REQUIRED)    Answer:   Screening    Order Specific Question:   Is the patient pregnant?    Answer:   No    Order Specific Question:   Preferred imaging location?    Answer:   [facility]  . HIV Antibody (routine testing w rflx)  . RPR  . Hepatitis B Surface AntiGEN  . Hepatitis C Antibody    Shelly Bombard MD 12-04-2017

## 2017-12-04 NOTE — Progress Notes (Signed)
Patient is in the office for annual, last pap 07-25-16. Pt states she has not had a mammogram.

## 2017-12-05 LAB — HEPATITIS C ANTIBODY: Hep C Virus Ab: <0.1 s/co ratio (ref 0.0–0.9)

## 2017-12-05 LAB — RPR: RPR: NONREACTIVE

## 2017-12-05 LAB — HEPATITIS B SURFACE ANTIGEN: Hepatitis B Surface Ag: NEGATIVE

## 2017-12-05 LAB — HIV ANTIBODY (ROUTINE TESTING W REFLEX): HIV SCREEN 4TH GENERATION: NONREACTIVE

## 2017-12-05 LAB — CERVICOVAGINAL ANCILLARY ONLY
Bacterial vaginitis: POSITIVE — AB
Candida vaginitis: NEGATIVE
Chlamydia: NEGATIVE
Neisseria Gonorrhea: NEGATIVE
TRICH (WINDOWPATH): NEGATIVE

## 2017-12-06 ENCOUNTER — Other Ambulatory Visit: Payer: Self-pay | Admitting: Obstetrics

## 2017-12-06 ENCOUNTER — Telehealth: Payer: Self-pay

## 2017-12-06 DIAGNOSIS — B9689 Other specified bacterial agents as the cause of diseases classified elsewhere: Secondary | ICD-10-CM

## 2017-12-06 DIAGNOSIS — N760 Acute vaginitis: Principal | ICD-10-CM

## 2017-12-06 LAB — CYTOLOGY - PAP
DIAGNOSIS: UNDETERMINED — AB
HPV (WINDOPATH): NOT DETECTED

## 2017-12-06 MED ORDER — TINIDAZOLE 500 MG PO TABS: 1000.0000 mg | ORAL_TABLET | Freq: Every day | ORAL | 2 refills | Status: DC

## 2017-12-06 NOTE — Telephone Encounter (Signed)
PA Approval faxed to Pharmacy 10.10.19 PA# [id number] [phone]  Effective 12/06/17-03/06/18

## 2017-12-10 ENCOUNTER — Ambulatory Visit (HOSPITAL_COMMUNITY): Admission: RE | Admit: 2017-12-10 | Payer: Medicaid Other | Source: Ambulatory Visit

## 2017-12-13 ENCOUNTER — Ambulatory Visit: Payer: Medicaid Other | Admitting: Family Medicine

## 2017-12-18 ENCOUNTER — Encounter: Payer: Self-pay | Admitting: Family Medicine

## 2017-12-18 ENCOUNTER — Ambulatory Visit: Payer: Medicaid Other | Attending: Family Medicine | Admitting: Family Medicine

## 2017-12-18 VITALS — BP 105/72 | HR 87 | Temp 97.9°F | Ht 64.0 in | Wt 262.0 lb

## 2017-12-18 DIAGNOSIS — F419 Anxiety disorder, unspecified: Secondary | ICD-10-CM | POA: Diagnosis not present

## 2017-12-18 DIAGNOSIS — R51 Headache: Secondary | ICD-10-CM | POA: Diagnosis not present

## 2017-12-18 DIAGNOSIS — I10 Essential (primary) hypertension: Secondary | ICD-10-CM

## 2017-12-18 DIAGNOSIS — G5600 Carpal tunnel syndrome, unspecified upper limb: Secondary | ICD-10-CM | POA: Diagnosis not present

## 2017-12-18 DIAGNOSIS — Z79899 Other long term (current) drug therapy: Secondary | ICD-10-CM | POA: Diagnosis not present

## 2017-12-18 DIAGNOSIS — K219 Gastro-esophageal reflux disease without esophagitis: Secondary | ICD-10-CM

## 2017-12-18 DIAGNOSIS — M62838 Other muscle spasm: Secondary | ICD-10-CM | POA: Diagnosis not present

## 2017-12-18 DIAGNOSIS — F3132 Bipolar disorder, current episode depressed, moderate: Secondary | ICD-10-CM | POA: Diagnosis not present

## 2017-12-18 DIAGNOSIS — Z7982 Long term (current) use of aspirin: Secondary | ICD-10-CM | POA: Diagnosis not present

## 2017-12-18 DIAGNOSIS — Z6841 Body Mass Index (BMI) 40.0 and over, adult: Secondary | ICD-10-CM | POA: Insufficient documentation

## 2017-12-18 DIAGNOSIS — G44229 Chronic tension-type headache, not intractable: Secondary | ICD-10-CM

## 2017-12-18 DIAGNOSIS — R519 Headache, unspecified: Secondary | ICD-10-CM

## 2017-12-18 MED ORDER — CYCLOBENZAPRINE HCL 10 MG PO TABS: 10.0000 mg | ORAL_TABLET | Freq: Two times a day (BID) | ORAL | 2 refills | Status: DC | PRN

## 2017-12-18 MED ORDER — LISINOPRIL 5 MG PO TABS: 5.0000 mg | ORAL_TABLET | Freq: Every day | ORAL | 6 refills | Status: DC

## 2017-12-18 MED ORDER — CETIRIZINE HCL 10 MG PO TABS: 10.0000 mg | ORAL_TABLET | Freq: Every day | ORAL | 1 refills | Status: AC

## 2017-12-18 MED ORDER — TOPIRAMATE 50 MG PO TABS: 50.0000 mg | ORAL_TABLET | Freq: Two times a day (BID) | ORAL | 3 refills | Status: DC

## 2017-12-18 MED ORDER — OMEPRAZOLE 20 MG PO CPDR: 20.0000 mg | DELAYED_RELEASE_CAPSULE | Freq: Every day | ORAL | 6 refills | Status: DC

## 2017-12-18 NOTE — Progress Notes (Signed)
Subjective:  Patient ID: Kathryn Larson, female    DOB: [birth year]  Age: 47 y.o. MRN: [id number]  CC: Hypertension and Headache   HPI Kathryn Larson is a 47 year old morbidly obese female with a medical history of hypertension, bipolar disorder, GERD who presents today for follow-up visit. She has had headaches ever since she ran out of Topamax.  Headaches occur on the right side of her face and she endorses being stressed lately.  She endorses postnasal drip but no fever, nasal congestion.  She also complains of myalgias which are described as muscle spasms occurring "everywhere" and seem to have worsened with her age over the last 4 years.  Symptoms are worse with the cold and rain. Her bipolar disorder is managed by Dr. Pearline Cables of psychiatry and she is compliant with her medications. She does have chronic ankle pain and was previously using a walker but is now ambulating without an assistive device.  Pain is more with going up and down the stairs. Her reflux has been stable and she is compliant with her antihypertensive.  Past Medical History:  Diagnosis Date  . Anxiety   . Bipolar affective ([place])   . Bipolar disorder, unspecified ([place]) 03/26/2013  . Calculus of gallbladder with acute cholecystitis, without mention of obstruction 03/26/2013  . Carpal tunnel syndrome   . GERD (gastroesophageal reflux disease) 1994  . GERD (gastroesophageal reflux disease) 03/26/2013  . Obesity   . Obesity, Class III, BMI 40-49.9 (morbid obesity) ([place]) 03/26/2013  . Ovarian cyst   . Panic attacks   . Rectal bleed    Hemorrhoids  . Seizures ([place])    last seizure in May 2015    Past Surgical History:  Procedure Laterality Date  . ANKLE FRACTURE SURGERY     Bilateral - right 2013, left 2010  . CHOLECYSTECTOMY N/A 03/24/2013   Procedure: LAPAROSCOPIC CHOLECYSTECTOMY;  Surgeon: [name], MD;  Location: [facility];  Service: General;  Laterality: N/A;  . ECTOPIC PREGNANCY SURGERY      No Known  Allergies   Outpatient Medications Prior to Visit  Medication Sig Dispense Refill  . acetaminophen-codeine (TYLENOL #3) 300-30 MG tablet Take 1 tablet by mouth every 12 (twelve) hours as needed for moderate pain. 60 tablet 1  . ALPRAZolam (XANAX) 1 MG tablet Take 1 mg by mouth 3 (three) times daily.    . divalproex (DEPAKOTE) 250 MG DR tablet Take 250 mg by mouth 2 (two) times daily.    Marland Kitchen escitalopram (LEXAPRO) 20 MG tablet TK 1 T PO QD FOR DEPRESSION  0  . ibuprofen (ADVIL,MOTRIN) 800 MG tablet Take 1 tablet (800 mg total) by mouth every 8 (eight) hours as needed. 30 tablet 5  . ibuprofen (ADVIL,MOTRIN) 800 MG tablet Take 1 tablet (800 mg total) by mouth every 8 (eight) hours as needed. 30 tablet 5  . prazosin (MINIPRESS) 5 MG capsule TK ONE C PO QD  0  . tinidazole (TINDAMAX) 500 MG tablet Take 2 tablets (1,000 mg total) by mouth daily with breakfast. 10 tablet 2  . zolpidem (AMBIEN) 10 MG tablet TK 1 T PO QD HS  0  . cyclobenzaprine (FLEXERIL) 10 MG tablet TAKE 1 TABLET BY MOUTH EVERY 12 HOURS AS NEEDED FOR MUSCLE SPASMS 60 tablet 0  . lisinopril (PRINIVIL,ZESTRIL) 5 MG tablet Take 1 tablet (5 mg total) by mouth daily. 30 tablet 5  . omeprazole (PRILOSEC) 20 MG capsule Take 1 capsule (20 mg total) by mouth daily. 30 capsule 5  .  topiramate (TOPAMAX) 50 MG tablet TAKE 1 TABLET BY MOUTH TWICE DAILY 60 tablet 0  . Aspirin-Acetaminophen-Caffeine (GOODY HEADACHE PO) Take 1 packet by mouth daily as needed (pain).    . metroNIDAZOLE (FLAGYL) 500 MG tablet Take 1 tablet (500 mg total) by mouth 2 (two) times daily. (Patient not taking: Reported on 09/28/2016) 14 tablet 2  . Phenyleph-CPM-DM-APAP (TYLENOL COLD MULTI-SYMPTOM) 06-28-08-325 MG TABS Take 2 tablets by mouth daily as needed (for cold).    . sertraline (ZOLOFT) 100 MG tablet Take 100 mg by mouth 3 (three) times daily.     No facility-administered medications prior to visit.     ROS Review of Systems  Constitutional: Negative for activity  change, appetite change and fatigue.  HENT: Negative for congestion, sinus pressure and sore throat.   Eyes: Negative for visual disturbance.  Respiratory: Negative for cough, chest tightness, shortness of breath and wheezing.   Cardiovascular: Negative for chest pain and palpitations.  Gastrointestinal: Negative for abdominal distention, abdominal pain and constipation.  Endocrine: Negative for polydipsia.  Genitourinary: Negative for dysuria and frequency.  Musculoskeletal: Negative for arthralgias and back pain.  Skin: Negative for rash.  Neurological: Positive for headaches. Negative for tremors, light-headedness and numbness.  Hematological: Does not bruise/bleed easily.  Psychiatric/Behavioral: Negative for agitation and behavioral problems.    Objective:  BP 105/72   Pulse 87   Temp 97.9 F (36.6 C) (Oral)   Ht 5' 4"  (1.626 m)   Wt 262 lb (118.8 kg)   LMP 11/24/2017   SpO2 98%   BMI 44.97 kg/m   BP/Weight 12/18/2017 60/08/3708 07/30/6946  Systolic BP 546 94 270  Diastolic BP 72 66 88  Wt. (Lbs) 262 - 261.2  BMI 44.97 44.83 44.83      Physical Exam  Constitutional: She is oriented to person, place, and time. She appears well-developed and well-nourished.  Morbidly obese  HENT:  Oropharyngeal erythema  Cardiovascular: Normal rate, normal heart sounds and intact distal pulses.  No murmur heard. Pulmonary/Chest: Effort normal and breath sounds normal. She has no wheezes. She has no rales. She exhibits no tenderness.  Abdominal: Soft. Bowel sounds are normal. She exhibits no distension and no mass. There is no tenderness.  Musculoskeletal: Normal range of motion.  Neurological: She is alert and oriented to person, place, and time.  Skin: Skin is warm and dry.  Psychiatric: She has a normal mood and affect.     Assessment & Plan:   1. Chronic tension-type headache, not intractable Uncontrolled due to running out of Topamax which I have refilled - topiramate  (TOPAMAX) 50 MG tablet; Take 1 tablet (50 mg total) by mouth 2 (two) times daily.  Dispense: 60 tablet; Refill: 3  2. Muscle spasm Commenced on muscle relaxants - cyclobenzaprine (FLEXERIL) 10 MG tablet; Take 1 tablet (10 mg total) by mouth 2 (two) times daily as needed for muscle spasms.  Dispense: 60 tablet; Refill: 2  3. GERD without esophagitis Stable - omeprazole (PRILOSEC) 20 MG capsule; Take 1 capsule (20 mg total) by mouth daily.  Dispense: 30 capsule; Refill: 6  4. Essential hypertension Controlled Counseled on blood pressure goal of less than 130/80, low-sodium, DASH diet, medication compliance, 150 minutes of moderate intensity exercise per week. Discussed medication compliance, adverse effects. - CMP14+EGFR - lisinopril (PRINIVIL,ZESTRIL) 5 MG tablet; Take 1 tablet (5 mg total) by mouth daily.  Dispense: 30 tablet; Refill: 6  5. Sinus headache - cetirizine (ZYRTEC) 10 MG tablet; Take 1 tablet (10 mg  total) by mouth daily.  Dispense: 30 tablet; Refill: 1  6. Bipolar affective disorder, currently depressed, moderate ([place]) As per mental health  7. Obesity, Class III, BMI 40-49.9 (morbid obesity) ([place]) Discussed reducing portion sizing, increasing physical activity as tolerated   Meds ordered this encounter  Medications  . topiramate (TOPAMAX) 50 MG tablet    Sig: Take 1 tablet (50 mg total) by mouth 2 (two) times daily.    Dispense:  60 tablet    Refill:  3  . cyclobenzaprine (FLEXERIL) 10 MG tablet    Sig: Take 1 tablet (10 mg total) by mouth 2 (two) times daily as needed for muscle spasms.    Dispense:  60 tablet    Refill:  2  . cetirizine (ZYRTEC) 10 MG tablet    Sig: Take 1 tablet (10 mg total) by mouth daily.    Dispense:  30 tablet    Refill:  1  . omeprazole (PRILOSEC) 20 MG capsule    Sig: Take 1 capsule (20 mg total) by mouth daily.    Dispense:  30 capsule    Refill:  6  . lisinopril (PRINIVIL,ZESTRIL) 5 MG tablet    Sig: Take 1 tablet (5 mg total) by  mouth daily.    Dispense:  30 tablet    Refill:  6    Follow-up: Return in about 3 months (around 03/20/2018) for follow up of chronic medical conditions.   Charlott Rakes MD

## 2017-12-18 NOTE — Progress Notes (Signed)
Patient is having muscle spasms.

## 2017-12-18 NOTE — Patient Instructions (Signed)
Sinus Headache A sinus headache happens when your sinuses become clogged or swollen. You may feel pain or pressure in your face, forehead, ears, or upper teeth. Sinus headaches can be mild or severe. Follow these instructions at home:  Take medicines only as told by your doctor.  If you were given an antibiotic medicine, finish all of it even if you start to feel better.  Use a nose spray if you feel stuffed up (congested).  If told, apply a warm, moist washcloth to your face to help lessen pain. Contact a doctor if:  You get headaches more than one time each week.  Light or sound bothers you.  You have a fever.  You feel sick to your stomach (nauseous) or you throw up (vomit).  Your headaches do not get better with treatment. Get help right away if:  You have trouble seeing.  You suddenly have very bad pain in your face or head.  You start to twitch or shake (seizure).  You are confused.  You have a stiff neck. This information is not intended to replace advice given to you by your health care provider. Make sure you discuss any questions you have with your health care provider. Document Released: 06/15/2010 Document Revised: 10/10/2015 Document Reviewed: 02/09/2014 Elsevier Interactive Patient Education  [employer].

## 2017-12-19 LAB — CMP14+EGFR
ALBUMIN: 3.6 g/dL (ref 3.5–5.5)
ALT: 10 IU/L (ref 0–32)
AST: 13 IU/L (ref 0–40)
Albumin/Globulin Ratio: 1.3 (ref 1.2–2.2)
Alkaline Phosphatase: 66 IU/L (ref 39–117)
BUN/Creatinine Ratio: 9 (ref 9–23)
BUN: 10 mg/dL (ref 6–24)
Bilirubin Total: <0.2 mg/dL (ref 0.0–1.2)
CALCIUM: 9.5 mg/dL (ref 8.7–10.2)
CO2: 23 mmol/L (ref 20–29)
CREATININE: 1.09 mg/dL — AB (ref 0.57–1.00)
Chloride: 107 mmol/L — ABNORMAL HIGH (ref 96–106)
GFR calc Af Amer: 71 mL/min/{1.73_m2} (ref >59)
GFR, EST NON AFRICAN AMERICAN: 61 mL/min/{1.73_m2} (ref >59)
GLOBULIN, TOTAL: 2.7 g/dL (ref 1.5–4.5)
Glucose: 68 mg/dL (ref 65–99)
Potassium: 4.5 mmol/L (ref 3.5–5.2)
SODIUM: 144 mmol/L (ref 134–144)
TOTAL PROTEIN: 6.3 g/dL (ref 6.0–8.5)

## 2017-12-20 ENCOUNTER — Ambulatory Visit: Payer: Medicaid Other | Admitting: Obstetrics

## 2017-12-25 ENCOUNTER — Telehealth: Payer: Self-pay

## 2017-12-25 NOTE — Telephone Encounter (Signed)
-----   Message from Charlott Rakes, MD sent at 12/19/2017  1:07 PM EDT ----- Labs are normal.

## 2017-12-25 NOTE — Telephone Encounter (Signed)
Patient was called and informed of lab results. 

## 2018-01-14 ENCOUNTER — Ambulatory Visit: Payer: Medicaid Other

## 2018-03-20 ENCOUNTER — Ambulatory Visit: Payer: Medicaid Other | Admitting: Family Medicine

## 2018-10-24 ENCOUNTER — Encounter: Payer: Self-pay | Admitting: Family Medicine

## 2018-10-24 ENCOUNTER — Ambulatory Visit (HOSPITAL_BASED_OUTPATIENT_CLINIC_OR_DEPARTMENT_OTHER): Payer: Medicaid Other | Admitting: Family Medicine

## 2018-10-24 DIAGNOSIS — R51 Headache: Secondary | ICD-10-CM | POA: Diagnosis not present

## 2018-10-24 DIAGNOSIS — K219 Gastro-esophageal reflux disease without esophagitis: Secondary | ICD-10-CM | POA: Diagnosis not present

## 2018-10-24 DIAGNOSIS — F3132 Bipolar disorder, current episode depressed, moderate: Secondary | ICD-10-CM

## 2018-10-24 DIAGNOSIS — Z79899 Other long term (current) drug therapy: Secondary | ICD-10-CM

## 2018-10-24 DIAGNOSIS — G44229 Chronic tension-type headache, not intractable: Secondary | ICD-10-CM | POA: Diagnosis not present

## 2018-10-24 MED ORDER — OMEPRAZOLE 20 MG PO CPDR: 20.0000 mg | DELAYED_RELEASE_CAPSULE | Freq: Every day | ORAL | 6 refills | Status: DC

## 2018-10-24 MED ORDER — TIZANIDINE HCL 4 MG PO TABS: 4.0000 mg | ORAL_TABLET | Freq: Every day | ORAL | 0 refills | Status: DC

## 2018-10-24 MED ORDER — TOPIRAMATE 50 MG PO TABS: 50.0000 mg | ORAL_TABLET | Freq: Two times a day (BID) | ORAL | 3 refills | Status: DC

## 2018-10-24 NOTE — Progress Notes (Signed)
Virtual Visit via Telephone Note  I connected with Kathryn Larson on 10/24/18 at 10:50 AM EDT by telephone and verified that I am speaking with the correct person using two identifiers.   I discussed the limitations, risks, security and privacy concerns of performing an evaluation and management service by telephone and the availability of in person appointments. I also discussed with the patient that there may be a patient responsible charge related to this service. The patient expressed understanding and agreed to proceed.  Patient Location: Home Provider Location: Office at [facility] Others participating in call: Call initiated by [place], CMA who then transferred the call to me   History of Present Illness:       48 year old female with medical history significant for morbid obesity, hypertension, bipolar disorder and acid reflux who was last seen in the office 12/18/2017 by Dr. Margarita Rana, due to complaint of recurrence of headaches at a running out of Topamax.  Patient also had complained at that visit of myalgias/muscle spasms.        At today's visit, patient again complains of worsening of headaches since being out of Topamax and feels that her headaches are now increased in frequency and intensity.  Headaches are generally right frontal/right temporal.  Headaches are pressure sensation.  She also reports episodes of headaches at the posterior scalp with muscle tightness in this area.  She also admits to increased anxiety which may be a contributing factor to her increase in headaches.  She reports that she has been out of her Lexapro and Xanax for treatment of anxiety for a few weeks and she felt that the Lexapro was not really effective.  She is followed by a psychiatrist, Dr. Pearline Cables but has not been able to see her psychiatrist due to the COVID-19 pandemic.  She denies any suicidal plan but some days will feel as if she does not feel that she wants to be here anymore.         Patient also  reports some increase in nausea over the past 2 to 3 weeks during the time period that her headaches have been increased.  She does have a history of acid reflux and has not been taking omeprazole daily to stretch out the medication.  She denies any actual abdominal pain.  She does take ibuprofen to help with joint pain/muscle aches and patient has had longstanding issues with sensation of generalized muscle spasm.  She also has history of ankle fracture and has had increased ankle pain with changes in the weather.         On additional review of systems, she denies any fever or chills, no shortness of breath or cough, no chest pain or palpitations.  Positive nausea, no abdominal pain-no constipation, no diarrhea or vomiting.  No urinary frequency, urgency or dysuria.  No increase in peripheral edema.  She does feel fatigued.  Past Medical History:  Diagnosis Date  . Anxiety   . Bipolar affective ([place])   . Bipolar disorder, unspecified ([place]) 03/26/2013  . Calculus of gallbladder with acute cholecystitis, without mention of obstruction 03/26/2013  . Carpal tunnel syndrome   . GERD (gastroesophageal reflux disease) 1994  . GERD (gastroesophageal reflux disease) 03/26/2013  . Obesity   . Obesity, Class III, BMI 40-49.9 (morbid obesity) ([place]) 03/26/2013  . Ovarian cyst   . Panic attacks   . Rectal bleed    Hemorrhoids  . Seizures ([place])    last seizure in May 2015  Past Surgical History:  Procedure Laterality Date  . ANKLE FRACTURE SURGERY     Bilateral - right 2013, left 2010  . CHOLECYSTECTOMY N/A 03/24/2013   Procedure: LAPAROSCOPIC CHOLECYSTECTOMY;  Surgeon: [name], MD;  Location: [facility] OR;  Service: General;  Laterality: N/A;  . ECTOPIC PREGNANCY SURGERY      Family History  Problem Relation Age of Onset  . Diabetes Mother   . Diabetes Sister   . Cancer Maternal Grandmother     Social History   Tobacco Use  . Smoking status: Current Every Day Smoker    Packs/day: 0.25     Types: Cigarettes  . Smokeless tobacco: Never Used  Substance Use Topics  . Alcohol use: Yes    Alcohol/week: 0.0 standard drinks    Comment: occ.  . Drug use: No     No Known Allergies     Observations/Objective: No vital signs or physical exam conducted as visit was done via telephone  Assessment and Plan: 1. Recurrent headache ;2.  Chronic tension headache Patient is being referred to neurology in follow-up of recurrent headaches and is asked to continue the use of Topamax which was refilled.  Based on description, some of patient's headache may be related to stress and anxiety.  She is also encouraged to make follow-up with her mental health provider and she will also be referred for follow-up with social worker at this office for assistance with counseling. - Ambulatory referral to Neurology -Topamax 50 mg twice daily #60 with 3 refills  2. GERD without esophagitis Patient reports recent increase in reflux symptoms/nausea.  Refill provided of patient's omeprazole and patient is encouraged to try and decrease use of ibuprofen, try switching to Tylenol for pain that is less severe.  Avoid known trigger foods as well as avoidance of late night eating.  Notify office if any worsening of symptoms, abdominal pain and seek medical attention at ED if black stools or blood in the stool - omeprazole (PRILOSEC) 20 MG capsule; Take 1 capsule (20 mg total) by mouth daily.  Dispense: 30 capsule; Refill: 6   4. Bipolar affective disorder, currently depressed, moderate ([place]) Patient with complaint of increased depression.  She is encouraged to contact her psychiatrist for further follow-up and changes in medications.  Patient will also be referred to the social worker at this office for further evaluation.  Refill provided of Topamax to help with headaches and patient is not sure if this medication was also prescribed to help with her bipolar disorder.  She does have some connection between increased  depression and increase in headaches.  Patient made aware that if she is having suicidal thoughts or ideations she should go to the emergency department for more immediate mental health evaluation and treatment.  TSH in follow-up of anxiety and depression.  CMP and CBC in follow-up of medication use. - topiramate (TOPAMAX) 50 MG tablet; Take 1 tablet (50 mg total) by mouth 2 (two) times daily.  Dispense: 60 tablet; Refill: 3 - Ambulatory referral to Social Work - Comprehensive metabolic panel; Future - TSH; Future - CBC with Differential; Future  5. Long-term use of high-risk medication Patient has been asked to come into the office to have lab work and follow-up of her long-term use of medications as well as of her anxiety/depression, use of nonsteroidal anti-inflammatories for pain as well as Topamax and medications per psychiatry.  Orders placed for patient to have comprehensive metabolic panel, CBC and TSH at lab visit which she  has been asked to schedule. - Comprehensive metabolic panel; Future - TSH; Future - CBC with Differential; Future  Follow Up Instructions:Return for labs- this Monday; 4-6 weeks with PCP.   I discussed the assessment and treatment plan with the patient. The patient was provided an opportunity to ask questions and all were answered. The patient agreed with the plan and demonstrated an understanding of the instructions.   The patient was advised to call back or seek an in-person evaluation if the symptoms worsen or if the condition fails to improve as anticipated.  I provided 18  minutes of non-face-to-face time during this encounter.   Antony Blackbird, MD

## 2018-10-24 NOTE — Progress Notes (Signed)
Patient verified DOB Patient complains of nausea, HA's and tremors for the past 2 weeks. HA's are lasting longer than normal HA's in the past and a scaled stronger. Patient has been without her anxiety medications and states the lexapro does not help and she has been out of the xanax. Patient states day to day she can be happy and then feel like she doesn't want to be here any more.

## 2018-10-30 ENCOUNTER — Telehealth: Payer: Self-pay | Admitting: Licensed Clinical Social Worker

## 2018-10-30 NOTE — Telephone Encounter (Signed)
Call placed to patient in attempt to follow up with [facility] consult from PCP. LCSW left message requesting a return call.

## 2018-11-22 ENCOUNTER — Telehealth: Payer: Self-pay | Admitting: Licensed Clinical Social Worker

## 2018-11-22 NOTE — Telephone Encounter (Signed)
Call placed to patient. LCSW introduced self and explained role at [facility]. Pt was informed of consult from PCP to assist with psychotherapy referral.   Pt shared that she receives psychiatry through [facility] and is open to therapy as recommended by psychiatrist. Pt provided consent for LCSW to complete referral to [facility].   LCSW inquired about any additional resource needs or concerns. Pt disclosed food insecurity and was provided information on [employer] through [facility] [employer].   Per pt request, she is in need of scheduling appointment. LCSW provided [employer] with contact information. No additional concerns noted.

## 2018-11-22 NOTE — Telephone Encounter (Signed)
Completed psychotherapy referral faxed to [facility].

## 2018-11-27 NOTE — Progress Notes (Signed)
Patient ID: Kathryn Larson, female   DOB: Nov 07, 1970, 48 y.o.   MRN: [id number] Virtual Visit via Telephone Note  I connected with Kathryn Larson on 11/27/18 at  1:30 PM EDT by telephone and verified that I am speaking with the correct person using two identifiers.   I discussed the limitations, risks, security and privacy concerns of performing an evaluation and management service by telephone and the availability of in person appointments. I also discussed with the patient that there may be a patient responsible charge related to this service. The patient expressed understanding and agreed to proceed.  Patient location:  home My Location:  home office Persons on the call:  Me and the patient   History of Present Illness:  Blood in stool  On and off for years.  It is occurring more frequently over the last 3 weeks.  Appetite is good.  No weight loss.  Blood has been bright red and only on TP.  No FH colon CA.  No weakness, dizziness, or CP.  No abdominal pain.  Some constipation, no diarrhea  Also co/ muscle cramping esp at night but at all times of the day too.  This occurs in her legs and feet and sometimes her arms.    Observations/Objective:  A&Ox3  Assessment and Plan: 1. Gastroesophageal reflux disease, unspecified whether esophagitis present - Comprehensive metabolic panel - CBC with Differential/Platelet  2. Constipation, unspecified constipation type - Comprehensive metabolic panel - Thyroid Panel With TSH -increase water, vegetables, and fiber intake.    3. Blood in stool - Comprehensive metabolic panel - CBC with Differential/Platelet  4. Essential hypertension Continue current regimen - Comprehensive metabolic panel  5. Muscle spasm D/c tizanidine - Comprehensive metabolic panel - Vitamin D, 25-hydroxy - methocarbamol (ROBAXIN) 500 MG tablet; Take 2 tablets (1,000 mg total) by mouth every 8 (eight) hours as needed for muscle spasms.  Dispense: 90 tablet; Refill:  1   Follow Up Instructions: See PCP in 6 weeks or next scheduled follow-up   I discussed the assessment and treatment plan with the patient. The patient was provided an opportunity to ask questions and all were answered. The patient agreed with the plan and demonstrated an understanding of the instructions.   The patient was advised to call back or seek an in-person evaluation if the symptoms worsen or if the condition fails to improve as anticipated.  I provided 12 minutes of non-face-to-face time during this encounter.   Freeman Caldron, PA-C

## 2018-11-28 ENCOUNTER — Ambulatory Visit: Payer: Medicaid Other

## 2018-11-28 ENCOUNTER — Ambulatory Visit: Payer: Medicaid Other | Attending: Family Medicine | Admitting: Physician Assistant

## 2018-11-28 ENCOUNTER — Other Ambulatory Visit: Payer: Self-pay

## 2018-11-28 DIAGNOSIS — K921 Melena: Secondary | ICD-10-CM | POA: Diagnosis not present

## 2018-11-28 DIAGNOSIS — M62838 Other muscle spasm: Secondary | ICD-10-CM

## 2018-11-28 DIAGNOSIS — K219 Gastro-esophageal reflux disease without esophagitis: Secondary | ICD-10-CM | POA: Diagnosis not present

## 2018-11-28 DIAGNOSIS — Z79899 Other long term (current) drug therapy: Secondary | ICD-10-CM

## 2018-11-28 DIAGNOSIS — K5900 Constipation, unspecified: Secondary | ICD-10-CM

## 2018-11-28 DIAGNOSIS — F3132 Bipolar disorder, current episode depressed, moderate: Secondary | ICD-10-CM

## 2018-11-28 DIAGNOSIS — I10 Essential (primary) hypertension: Secondary | ICD-10-CM

## 2018-11-28 MED ORDER — METHOCARBAMOL 500 MG PO TABS: 1000.0000 mg | ORAL_TABLET | Freq: Three times a day (TID) | ORAL | 1 refills | Status: DC | PRN

## 2018-11-29 LAB — COMPREHENSIVE METABOLIC PANEL WITH GFR
ALT: 41 IU/L — ABNORMAL HIGH (ref 0–32)
AST: 39 IU/L (ref 0–40)
Albumin/Globulin Ratio: 1.2 (ref 1.2–2.2)
Albumin: 4.3 g/dL (ref 3.8–4.8)
Alkaline Phosphatase: 85 IU/L (ref 39–117)
BUN/Creatinine Ratio: 9 (ref 9–23)
BUN: 9 mg/dL (ref 6–24)
Bilirubin Total: 0.3 mg/dL (ref 0.0–1.2)
CO2: 17 mmol/L — ABNORMAL LOW (ref 20–29)
Calcium: 9.9 mg/dL (ref 8.7–10.2)
Chloride: 106 mmol/L (ref 96–106)
Creatinine, Ser: 1.01 mg/dL — ABNORMAL HIGH (ref 0.57–1.00)
GFR calc Af Amer: 77 mL/min/1.73
GFR calc non Af Amer: 67 mL/min/1.73
Globulin, Total: 3.5 g/dL (ref 1.5–4.5)
Glucose: 82 mg/dL (ref 65–99)
Potassium: 4.3 mmol/L (ref 3.5–5.2)
Sodium: 137 mmol/L (ref 134–144)
Total Protein: 7.8 g/dL (ref 6.0–8.5)

## 2018-11-29 LAB — CBC WITH DIFFERENTIAL/PLATELET
Basophils Absolute: 0 x10E3/uL (ref 0.0–0.2)
Basos: 1 %
EOS (ABSOLUTE): 0.2 x10E3/uL (ref 0.0–0.4)
Eos: 3 %
Hematocrit: 44.6 % (ref 34.0–46.6)
Hemoglobin: 15 g/dL (ref 11.1–15.9)
Immature Grans (Abs): 0 x10E3/uL (ref 0.0–0.1)
Immature Granulocytes: 0 %
Lymphocytes Absolute: 2.8 x10E3/uL (ref 0.7–3.1)
Lymphs: 38 %
MCH: 31.1 pg (ref 26.6–33.0)
MCHC: 33.6 g/dL (ref 31.5–35.7)
MCV: 93 fL (ref 79–97)
Monocytes Absolute: 0.6 x10E3/uL (ref 0.1–0.9)
Monocytes: 9 %
Neutrophils Absolute: 3.6 x10E3/uL (ref 1.4–7.0)
Neutrophils: 49 %
Platelets: 342 x10E3/uL (ref 150–450)
RBC: 4.82 x10E6/uL (ref 3.77–5.28)
RDW: 12.8 % (ref 11.7–15.4)
WBC: 7.2 x10E3/uL (ref 3.4–10.8)

## 2018-11-29 LAB — TSH: TSH: 1.26 u[IU]/mL (ref 0.450–4.500)

## 2018-12-26 ENCOUNTER — Other Ambulatory Visit: Payer: Self-pay

## 2018-12-26 ENCOUNTER — Encounter: Payer: Self-pay | Admitting: Obstetrics

## 2018-12-26 ENCOUNTER — Other Ambulatory Visit (HOSPITAL_COMMUNITY)
Admission: RE | Admit: 2018-12-26 | Discharge: 2018-12-26 | Disposition: A | Discharge status: Home / Self Care | Payer: Medicaid Other | Source: Ambulatory Visit | Attending: Obstetrics | Admitting: Obstetrics

## 2018-12-26 ENCOUNTER — Ambulatory Visit (INDEPENDENT_AMBULATORY_CARE_PROVIDER_SITE_OTHER): Payer: Medicaid Other | Admitting: Obstetrics

## 2018-12-26 VITALS — BP 132/88 | Wt 265.0 lb

## 2018-12-26 DIAGNOSIS — R300 Dysuria: Secondary | ICD-10-CM | POA: Diagnosis not present

## 2018-12-26 DIAGNOSIS — N390 Urinary tract infection, site not specified: Secondary | ICD-10-CM | POA: Diagnosis not present

## 2018-12-26 DIAGNOSIS — M549 Dorsalgia, unspecified: Secondary | ICD-10-CM | POA: Diagnosis not present

## 2018-12-26 DIAGNOSIS — N760 Acute vaginitis: Secondary | ICD-10-CM | POA: Insufficient documentation

## 2018-12-26 LAB — POCT URINALYSIS DIPSTICK
Bilirubin, UA: NEGATIVE
Blood, UA: POSITIVE
Glucose, UA: NEGATIVE
Ketones, UA: NEGATIVE
Nitrite, UA: NEGATIVE
Protein, UA: NEGATIVE
Spec Grav, UA: 1.015 (ref 1.010–1.025)
Urobilinogen, UA: 0.2 E.U./dL
pH, UA: 7.5 (ref 5.0–8.0)

## 2018-12-26 MED ORDER — CEFUROXIME AXETIL 500 MG PO TABS: 500.0000 mg | ORAL_TABLET | Freq: Two times a day (BID) | ORAL | 1 refills | Status: DC

## 2018-12-26 MED ORDER — IBUPROFEN 800 MG PO TABS: 800.0000 mg | ORAL_TABLET | Freq: Three times a day (TID) | ORAL | 5 refills | Status: DC | PRN

## 2018-12-26 MED ORDER — FLUCONAZOLE 150 MG PO TABS: 150.0000 mg | ORAL_TABLET | Freq: Once | ORAL | 0 refills | Status: AC

## 2018-12-26 NOTE — Progress Notes (Signed)
Pt is here with c/o dysuria. Pt reports she has had the symptoms for about a month.

## 2018-12-26 NOTE — Progress Notes (Signed)
Patient ID: Kathryn Larson, female   DOB: [birth year], 48 y.o.   MRN: [id number]  Chief Complaint  Patient presents with  . Dysuria    HPI Kathryn Larson is a 48 y.o. female.  Burning and frequency of urination, backache and occasional fever/chills.  These symptoms have been present for ~ 1 month. HPI  Past Medical History:  Diagnosis Date  . Anxiety   . Bipolar affective ([place])   . Bipolar disorder, unspecified ([place]) 03/26/2013  . Calculus of gallbladder with acute cholecystitis, without mention of obstruction 03/26/2013  . Carpal tunnel syndrome   . GERD (gastroesophageal reflux disease) 1994  . GERD (gastroesophageal reflux disease) 03/26/2013  . Obesity   . Obesity, Class III, BMI 40-49.9 (morbid obesity) ([place]) 03/26/2013  . Ovarian cyst   . Panic attacks   . Rectal bleed    Hemorrhoids  . Seizures ([place])    last seizure in May 2015    Past Surgical History:  Procedure Laterality Date  . ANKLE FRACTURE SURGERY     Bilateral - right 2013, left 2010  . CHOLECYSTECTOMY N/A 03/24/2013   Procedure: LAPAROSCOPIC CHOLECYSTECTOMY;  Surgeon: [name], MD;  Location: [facility] OR;  Service: General;  Laterality: N/A;  . ECTOPIC PREGNANCY SURGERY      Family History  Problem Relation Age of Onset  . Diabetes Mother   . Diabetes Sister   . Cancer Maternal Grandmother     Social History Social History   Tobacco Use  . Smoking status: Current Every Day Smoker    Packs/day: 0.25    Types: Cigarettes  . Smokeless tobacco: Never Used  Substance Use Topics  . Alcohol use: Yes    Alcohol/week: 0.0 standard drinks    Comment: occ.  . Drug use: No    No Known Allergies  Current Outpatient Medications  Medication Sig Dispense Refill  . divalproex (DEPAKOTE) 250 MG DR tablet Take 250 mg by mouth 2 (two) times daily.    Marland Kitchen escitalopram (LEXAPRO) 20 MG tablet TK 1 T PO QD FOR DEPRESSION  0  . lisinopril (PRINIVIL,ZESTRIL) 5 MG tablet Take 1 tablet (5 mg total) by mouth daily. 30  tablet 6  . methocarbamol (ROBAXIN) 500 MG tablet Take 2 tablets (1,000 mg total) by mouth every 8 (eight) hours as needed for muscle spasms. 90 tablet 1  . omeprazole (PRILOSEC) 20 MG capsule Take 1 capsule (20 mg total) by mouth daily. 30 capsule 6  . prazosin (MINIPRESS) 5 MG capsule TK ONE C PO QD  0  . sertraline (ZOLOFT) 100 MG tablet Take 100 mg by mouth 3 (three) times daily.    Marland Kitchen tinidazole (TINDAMAX) 500 MG tablet Take 2 tablets (1,000 mg total) by mouth daily with breakfast. 10 tablet 2  . topiramate (TOPAMAX) 50 MG tablet Take 1 tablet (50 mg total) by mouth 2 (two) times daily. 60 tablet 3  . acetaminophen-codeine (TYLENOL #3) 300-30 MG tablet Take 1 tablet by mouth every 12 (twelve) hours as needed for moderate pain. (Patient not taking: Reported on 10/24/2018) 60 tablet 1  . ALPRAZolam (XANAX) 1 MG tablet Take 1 mg by mouth 3 (three) times daily.    . Aspirin-Acetaminophen-Caffeine (GOODY HEADACHE PO) Take 1 packet by mouth daily as needed (pain).    . cefUROXime (CEFTIN) 500 MG tablet Take 1 tablet (500 mg total) by mouth 2 (two) times daily with a meal. 14 tablet 1  . cetirizine (ZYRTEC) 10 MG tablet Take 1 tablet (10 mg total)  by mouth daily. (Patient not taking: Reported on 12/26/2018) 30 tablet 1  . fluconazole (DIFLUCAN) 150 MG tablet Take 1 tablet (150 mg total) by mouth once for 1 dose. 1 tablet 0  . ibuprofen (ADVIL) 800 MG tablet Take 1 tablet (800 mg total) by mouth every 8 (eight) hours as needed. 30 tablet 5  . Phenyleph-CPM-DM-APAP (TYLENOL COLD MULTI-SYMPTOM) 06-28-08-325 MG TABS Take 2 tablets by mouth daily as needed (for cold).     No current facility-administered medications for this visit.     Review of Systems Review of Systems Constitutional: negative for fatigue and weight loss Respiratory: negative for cough and wheezing Cardiovascular: negative for chest pain, fatigue and palpitations Gastrointestinal: negative for abdominal pain and change in bowel  habits Genitourinary:negative Integument/breast: negative for nipple discharge Musculoskeletal:negative for myalgias Neurological: negative for gait problems and tremors Behavioral/Psych: negative for abusive relationship, depression Endocrine: negative for temperature intolerance      Blood pressure 132/88, weight 265 lb (120.2 kg), last menstrual period 11/06/2018.  Physical Exam Physical Exam General:   alert and no distress  Skin:   no rash or abnormalities  Lungs:   clear to auscultation bilaterally  Heart:   regular rate and rhythm, S1, S2 normal, no murmur, click, rub or gallop                  Back:  Positive flank tenderness, bilateral  50% of 15 min visit spent on counseling and coordination of care.   Data Reviewed Results for KRISTIANN, NOYCE (MRN [id number]) as of 12/26/2018 15:39  Ref. Range 12/26/2018 15:05  Bilirubin, UA Unknown neg  Clarity, UA Unknown clear  Color, UA Unknown yellow  Glucose Latest Ref Range: Negative  Negative  Ketones, UA Unknown neg  Leukocytes,UA Latest Ref Range: Negative  Small (1+) (A)  Nitrite, UA Unknown neg  pH, UA Latest Ref Range: 5.0 - 8.0  7.5  Protein,UA Latest Ref Range: Negative  Negative  Specific Gravity, UA Latest Ref Range: 1.010 - 1.025  1.015  Urobilinogen, UA Latest Ref Range: 0.2 or 1.0 E.U./dL 0.2  RBC, UA Unknown positive    Assessment     1. Dysuria Rx: - POCT Urinalysis Dipstick - Urine Culture  2. Backache symptom Rx: - ibuprofen (ADVIL) 800 MG tablet; Take 1 tablet (800 mg total) by mouth every 8 (eight) hours as needed.  Dispense: 30 tablet; Refill: 5  3. Complicated UTI (urinary tract infection) - Ascending Rx: - cefUROXime (CEFTIN) 500 MG tablet; Take 1 tablet (500 mg total) by mouth 2 (two) times daily with a meal.  Dispense: 14 tablet; Refill: 1  4. Acute vaginitis - probable yeast infection Rx: - Cervicovaginal ancillary only( [place]) - fluconazole (DIFLUCAN) 150 MG tablet; Take 1 tablet  (150 mg total) by mouth once for 1 dose.  Dispense: 1 tablet; Refill: 0    Plan    Follow up in 4 weeks for an Annual Exam  Orders Placed This Encounter  Procedures  . Urine Culture  . POCT Urinalysis Dipstick   Meds ordered this encounter  Medications  . cefUROXime (CEFTIN) 500 MG tablet    Sig: Take 1 tablet (500 mg total) by mouth 2 (two) times daily with a meal.    Dispense:  14 tablet    Refill:  1  . fluconazole (DIFLUCAN) 150 MG tablet    Sig: Take 1 tablet (150 mg total) by mouth once for 1 dose.    Dispense:  1 tablet  Refill:  0  . ibuprofen (ADVIL) 800 MG tablet    Sig: Take 1 tablet (800 mg total) by mouth every 8 (eight) hours as needed.    Dispense:  30 tablet    Refill:  5    Shelly Bombard, MD 12/26/2018 3:24 PM

## 2018-12-27 LAB — CERVICOVAGINAL ANCILLARY ONLY
Bacterial Vaginitis (gardnerella): POSITIVE — AB
Candida Glabrata: NEGATIVE
Candida Vaginitis: NEGATIVE
Chlamydia: NEGATIVE
Comment: NEGATIVE
Comment: NEGATIVE
Comment: NEGATIVE
Comment: NEGATIVE
Comment: NEGATIVE
Comment: NORMAL
Neisseria Gonorrhea: NEGATIVE
Trichomonas: NEGATIVE

## 2018-12-28 LAB — URINE CULTURE: Organism ID, Bacteria: NO GROWTH

## 2018-12-29 ENCOUNTER — Other Ambulatory Visit: Payer: Self-pay | Admitting: Obstetrics

## 2018-12-31 ENCOUNTER — Other Ambulatory Visit: Payer: Self-pay | Admitting: Family Medicine

## 2018-12-31 DIAGNOSIS — I10 Essential (primary) hypertension: Secondary | ICD-10-CM

## 2019-01-15 ENCOUNTER — Ambulatory Visit: Payer: Medicaid Other | Admitting: Family Medicine

## 2019-01-22 ENCOUNTER — Ambulatory Visit: Payer: Medicaid Other | Attending: Family Medicine | Admitting: Family Medicine

## 2019-01-22 ENCOUNTER — Encounter: Payer: Self-pay | Admitting: Family Medicine

## 2019-01-22 ENCOUNTER — Other Ambulatory Visit: Payer: Self-pay

## 2019-01-22 VITALS — BP 116/80 | HR 77 | Temp 98.0°F | Ht 64.0 in | Wt 270.0 lb

## 2019-01-22 DIAGNOSIS — G8929 Other chronic pain: Secondary | ICD-10-CM

## 2019-01-22 DIAGNOSIS — F419 Anxiety disorder, unspecified: Secondary | ICD-10-CM | POA: Insufficient documentation

## 2019-01-22 DIAGNOSIS — Z6841 Body Mass Index (BMI) 40.0 and over, adult: Secondary | ICD-10-CM

## 2019-01-22 DIAGNOSIS — F3132 Bipolar disorder, current episode depressed, moderate: Secondary | ICD-10-CM | POA: Diagnosis not present

## 2019-01-22 DIAGNOSIS — G44229 Chronic tension-type headache, not intractable: Secondary | ICD-10-CM | POA: Insufficient documentation

## 2019-01-22 DIAGNOSIS — Z9049 Acquired absence of other specified parts of digestive tract: Secondary | ICD-10-CM | POA: Diagnosis not present

## 2019-01-22 DIAGNOSIS — F410 Panic disorder [episodic paroxysmal anxiety] without agoraphobia: Secondary | ICD-10-CM | POA: Diagnosis not present

## 2019-01-22 DIAGNOSIS — Z23 Encounter for immunization: Secondary | ICD-10-CM

## 2019-01-22 DIAGNOSIS — K219 Gastro-esophageal reflux disease without esophagitis: Secondary | ICD-10-CM | POA: Diagnosis not present

## 2019-01-22 DIAGNOSIS — M545 Low back pain, unspecified: Secondary | ICD-10-CM

## 2019-01-22 DIAGNOSIS — Z79899 Other long term (current) drug therapy: Secondary | ICD-10-CM | POA: Insufficient documentation

## 2019-01-22 DIAGNOSIS — R109 Unspecified abdominal pain: Secondary | ICD-10-CM | POA: Diagnosis not present

## 2019-01-22 DIAGNOSIS — I10 Essential (primary) hypertension: Secondary | ICD-10-CM | POA: Diagnosis not present

## 2019-01-22 DIAGNOSIS — Z809 Family history of malignant neoplasm, unspecified: Secondary | ICD-10-CM | POA: Diagnosis not present

## 2019-01-22 DIAGNOSIS — Z833 Family history of diabetes mellitus: Secondary | ICD-10-CM | POA: Insufficient documentation

## 2019-01-22 LAB — POCT URINALYSIS DIP (CLINITEK)
Bilirubin, UA: NEGATIVE
Glucose, UA: NEGATIVE mg/dL
Ketones, POC UA: NEGATIVE mg/dL
Leukocytes, UA: NEGATIVE
Nitrite, UA: NEGATIVE
POC PROTEIN,UA: NEGATIVE
Spec Grav, UA: 1.025 (ref 1.010–1.025)
Urobilinogen, UA: 0.2 E.U./dL
pH, UA: 6 (ref 5.0–8.0)

## 2019-01-22 MED ORDER — ACETAMINOPHEN-CODEINE #3 300-30 MG PO TABS: 1.0000 | ORAL_TABLET | Freq: Two times a day (BID) | ORAL | 0 refills | Status: DC | PRN

## 2019-01-22 MED ORDER — TOPIRAMATE 50 MG PO TABS: 50.0000 mg | ORAL_TABLET | Freq: Two times a day (BID) | ORAL | 3 refills | Status: DC

## 2019-01-22 NOTE — Progress Notes (Signed)
Subjective:  Patient ID: Kathryn Larson, female    DOB: 12/12/1970  Age: 48 y.o. MRN: [id number]  CC: Hypertension   HPI Kathryn Larson is a 48 year old morbidly obese female with a medical history of hypertension, bipolar disorder, GERD who presents today for follow-up visit.  She complains of problems with her right side and back described as unbearable with difficulty getting out of the chair and off the bed. He has has urgency but no dysuria. Twisting increases the pain and pain radiates down right lower extremity. Tylenol is ineffective. She also has lower abdominal pain Her GYN, Dr Jodi Mourning prescribed Ceftin for UTI and she received Diflucan for Vaginal candidiasis; Tinidazole for BV at her visit one month ago.  Migraine is controlled on her current regimen. She is doing well on her antihypertensive. Concerned she has been unable to loose weight and states her ankle hurt hence she cannot exercise. Bipolar disorder is managed by Psych.  Past Medical History:  Diagnosis Date  . Anxiety   . Bipolar affective ([place])   . Bipolar disorder, unspecified ([place]) 03/26/2013  . Calculus of gallbladder with acute cholecystitis, without mention of obstruction 03/26/2013  . Carpal tunnel syndrome   . GERD (gastroesophageal reflux disease) 1994  . GERD (gastroesophageal reflux disease) 03/26/2013  . Obesity   . Obesity, Class III, BMI 40-49.9 (morbid obesity) ([place]) 03/26/2013  . Ovarian cyst   . Panic attacks   . Rectal bleed    Hemorrhoids  . Seizures ([place])    last seizure in May 2015    Past Surgical History:  Procedure Laterality Date  . ANKLE FRACTURE SURGERY     Bilateral - right 2013, left 2010  . CHOLECYSTECTOMY N/A 03/24/2013   Procedure: LAPAROSCOPIC CHOLECYSTECTOMY;  Surgeon: [name], MD;  Location: [facility] OR;  Service: General;  Laterality: N/A;  . ECTOPIC PREGNANCY SURGERY      Family History  Problem Relation Age of Onset  . Diabetes Mother   . Diabetes Sister   .  Cancer Maternal Grandmother     No Known Allergies  Outpatient Medications Prior to Visit  Medication Sig Dispense Refill  . Aspirin-Acetaminophen-Caffeine (GOODY HEADACHE PO) Take 1 packet by mouth daily as needed (pain).    . cefUROXime (CEFTIN) 500 MG tablet Take 1 tablet (500 mg total) by mouth 2 (two) times daily with a meal. 14 tablet 1  . divalproex (DEPAKOTE) 250 MG DR tablet Take 250 mg by mouth 2 (two) times daily.    Marland Kitchen escitalopram (LEXAPRO) 20 MG tablet TK 1 T PO QD FOR DEPRESSION  0  . ibuprofen (ADVIL) 800 MG tablet Take 1 tablet (800 mg total) by mouth every 8 (eight) hours as needed. 30 tablet 5  . lisinopril (ZESTRIL) 5 MG tablet TAKE 1 TABLET BY MOUTH DAILY 30 tablet 6  . methocarbamol (ROBAXIN) 500 MG tablet Take 2 tablets (1,000 mg total) by mouth every 8 (eight) hours as needed for muscle spasms. 90 tablet 1  . omeprazole (PRILOSEC) 20 MG capsule Take 1 capsule (20 mg total) by mouth daily. 30 capsule 6  . prazosin (MINIPRESS) 5 MG capsule TK ONE C PO QD  0  . sertraline (ZOLOFT) 100 MG tablet Take 100 mg by mouth 3 (three) times daily.    Marland Kitchen tinidazole (TINDAMAX) 500 MG tablet Take 2 tablets (1,000 mg total) by mouth daily with breakfast. 10 tablet 2  . topiramate (TOPAMAX) 50 MG tablet Take 1 tablet (50 mg total) by mouth 2 (two)  times daily. 60 tablet 3  . acetaminophen-codeine (TYLENOL #3) 300-30 MG tablet Take 1 tablet by mouth every 12 (twelve) hours as needed for moderate pain. (Patient not taking: Reported on 10/24/2018) 60 tablet 1  . ALPRAZolam (XANAX) 1 MG tablet Take 1 mg by mouth 3 (three) times daily.    . cetirizine (ZYRTEC) 10 MG tablet Take 1 tablet (10 mg total) by mouth daily. (Patient not taking: Reported on 12/26/2018) 30 tablet 1  . Phenyleph-CPM-DM-APAP (TYLENOL COLD MULTI-SYMPTOM) 06-28-08-325 MG TABS Take 2 tablets by mouth daily as needed (for cold).     No facility-administered medications prior to visit.      ROS Review of Systems   Constitutional: Negative for activity change, appetite change and fatigue.  HENT: Negative for congestion, sinus pressure and sore throat.   Eyes: Negative for visual disturbance.  Respiratory: Negative for cough, chest tightness, shortness of breath and wheezing.   Cardiovascular: Negative for chest pain and palpitations.  Gastrointestinal: Negative for abdominal distention, abdominal pain and constipation.  Endocrine: Negative for polydipsia.  Genitourinary: Negative for dysuria and frequency.  Musculoskeletal:       See HPI  Skin: Negative for rash.  Neurological: Negative for tremors, light-headedness and numbness.  Hematological: Does not bruise/bleed easily.  Psychiatric/Behavioral: Negative for agitation and behavioral problems.    Objective:  BP 116/80   Pulse 77   Temp 98 F (36.7 C) (Oral)   Ht 5' 4"  (1.626 m)   Wt 270 lb (122.5 kg)   SpO2 97%   BMI 46.35 kg/m   BP/Weight 01/22/2019 12/26/2018 00/45/9977  Systolic BP 414 239 532  Diastolic BP 80 88 72  Wt. (Lbs) 270 265 262  BMI 46.35 45.49 44.97      Physical Exam Constitutional:      Appearance: She is well-developed. She is obese.  Neck:     Vascular: No JVD.  Cardiovascular:     Rate and Rhythm: Normal rate.     Heart sounds: Normal heart sounds. No murmur.  Pulmonary:     Effort: Pulmonary effort is normal.     Breath sounds: Normal breath sounds. No wheezing or rales.  Chest:     Chest wall: No tenderness.  Abdominal:     General: Bowel sounds are normal. There is no distension.     Palpations: Abdomen is soft. There is no mass.     Tenderness: There is no abdominal tenderness. There is right CVA tenderness.  Musculoskeletal: Normal range of motion.     Right lower leg: No edema.     Left lower leg: No edema.     Comments: TTP of right flank and on twisting motion  Neurological:     Mental Status: She is alert and oriented to person, place, and time.  Psychiatric:        Mood and Affect:  Mood normal.     CMP Latest Ref Rng & Units 11/28/2018 12/18/2017 06/20/2016  Glucose 65 - 99 mg/dL 82 68 86  BUN 6 - 24 mg/dL 9 10 5(L)  Creatinine 0.57 - 1.00 mg/dL 1.01(H) 1.09(H) 0.82  Sodium 134 - 144 mmol/L 137 144 137  Potassium 3.5 - 5.2 mmol/L 4.3 4.5 3.7  Chloride 96 - 106 mmol/L 106 107(H) 107  CO2 20 - 29 mmol/L 17(L) 23 21(L)  Calcium 8.7 - 10.2 mg/dL 9.9 9.5 9.1  Total Protein 6.0 - 8.5 g/dL 7.8 6.3 7.4  Total Bilirubin 0.0 - 1.2 mg/dL 0.3 <0.2 0.5  Alkaline  Phos 39 - 117 IU/L 85 66 82  AST 0 - 40 IU/L 39 13 20  ALT 0 - 32 IU/L 41(H) 10 20    Lipid Panel     Component Value Date/Time   CHOL 191 11/19/2015 1116   TRIG 95 11/19/2015 1116   HDL 40 (L) 11/19/2015 1116   CHOLHDL 4.8 11/19/2015 1116   VLDL 19 11/19/2015 1116   LDLCALC 132 (H) 11/19/2015 1116    CBC    Component Value Date/Time   WBC 7.2 11/28/2018 1426   WBC 7.8 06/20/2016 1526   RBC 4.82 11/28/2018 1426   RBC 4.50 06/20/2016 1526   HGB 15.0 11/28/2018 1426   HCT 44.6 11/28/2018 1426   PLT 342 11/28/2018 1426   MCV 93 11/28/2018 1426   MCH 31.1 11/28/2018 1426   MCH 31.6 06/20/2016 1526   MCHC 33.6 11/28/2018 1426   MCHC 34.0 06/20/2016 1526   RDW 12.8 11/28/2018 1426   LYMPHSABS 2.8 11/28/2018 1426   MONOABS 0.8 05/16/2014 1730   EOSABS 0.2 11/28/2018 1426   BASOSABS 0.0 11/28/2018 1426    No results found for: HGBA1C  Assessment & Plan:   1. Chronic tension-type headache, not intractable Controlled - topiramate (TOPAMAX) 50 MG tablet; Take 1 tablet (50 mg total) by mouth 2 (two) times daily.  Dispense: 60 tablet; Refill: 3  2. Bipolar affective disorder, currently depressed, moderate ([place]) Stable Management as per Psych  3. Right flank pain Ua negative for UTI, presence of trace blood If persisting consider renal stone protocol - POCT URINALYSIS DIP (CLINITEK)  4. Chronic midline low back pain without sciatica Advised to apply heat - acetaminophen-codeine (TYLENOL #3)  300-30 MG tablet; Take 1 tablet by mouth every 12 (twelve) hours as needed for moderate pain.  Dispense: 60 tablet; Refill: 0  5. Obesity, Class III, BMI 40-49.9 (morbid obesity) (HCC) - Amb Referral to Bariatric Surgery      Charlott Rakes, MD, FAAFP. [facility] and [place], [place]   01/22/2019, 4:07 PM

## 2019-01-27 ENCOUNTER — Ambulatory Visit: Payer: Medicaid Other | Admitting: Obstetrics

## 2019-02-18 ENCOUNTER — Ambulatory Visit: Payer: Medicaid Other | Admitting: Family Medicine

## 2019-04-02 ENCOUNTER — Other Ambulatory Visit: Payer: Self-pay

## 2019-04-02 ENCOUNTER — Ambulatory Visit: Payer: Medicaid Other | Attending: Family Medicine | Admitting: Physician Assistant

## 2019-04-02 VITALS — BP 104/72 | HR 82 | Temp 98.5°F | Ht 65.0 in | Wt 266.0 lb

## 2019-04-02 DIAGNOSIS — N23 Unspecified renal colic: Secondary | ICD-10-CM

## 2019-04-02 DIAGNOSIS — M545 Low back pain, unspecified: Secondary | ICD-10-CM

## 2019-04-02 DIAGNOSIS — M62838 Other muscle spasm: Secondary | ICD-10-CM | POA: Diagnosis not present

## 2019-04-02 DIAGNOSIS — F419 Anxiety disorder, unspecified: Secondary | ICD-10-CM | POA: Insufficient documentation

## 2019-04-02 DIAGNOSIS — Z7982 Long term (current) use of aspirin: Secondary | ICD-10-CM | POA: Insufficient documentation

## 2019-04-02 DIAGNOSIS — K219 Gastro-esophageal reflux disease without esophagitis: Secondary | ICD-10-CM | POA: Diagnosis not present

## 2019-04-02 DIAGNOSIS — Z79899 Other long term (current) drug therapy: Secondary | ICD-10-CM | POA: Insufficient documentation

## 2019-04-02 DIAGNOSIS — Z6841 Body Mass Index (BMI) 40.0 and over, adult: Secondary | ICD-10-CM | POA: Insufficient documentation

## 2019-04-02 DIAGNOSIS — M79605 Pain in left leg: Secondary | ICD-10-CM | POA: Insufficient documentation

## 2019-04-02 DIAGNOSIS — F319 Bipolar disorder, unspecified: Secondary | ICD-10-CM | POA: Diagnosis not present

## 2019-04-02 DIAGNOSIS — M79604 Pain in right leg: Secondary | ICD-10-CM | POA: Diagnosis not present

## 2019-04-02 LAB — POCT URINALYSIS DIP (CLINITEK)
Bilirubin, UA: NEGATIVE
Glucose, UA: NEGATIVE mg/dL
Ketones, POC UA: NEGATIVE mg/dL
Leukocytes, UA: NEGATIVE
Nitrite, UA: NEGATIVE
Spec Grav, UA: 1.025 (ref 1.010–1.025)
Urobilinogen, UA: 0.2 E.U./dL
pH, UA: 5 (ref 5.0–8.0)

## 2019-04-02 MED ORDER — METHOCARBAMOL 500 MG PO TABS: 1000.0000 mg | ORAL_TABLET | Freq: Three times a day (TID) | ORAL | 1 refills | Status: DC | PRN

## 2019-04-02 MED ORDER — NAPROXEN 500 MG PO TABS: 500.0000 mg | ORAL_TABLET | Freq: Two times a day (BID) | ORAL | 1 refills | Status: DC

## 2019-04-02 NOTE — Patient Instructions (Signed)
Increase your water intake.  Make sure you are drinking 80-100 ounces water daily

## 2019-04-02 NOTE — Progress Notes (Signed)
Kathryn Larson, is a 49 y.o. female  IPJ:[id number]  BHA:[id number]  DOB - May 04, 1970  Subjective:  Chief Complaint and HPI: Kathryn Larson is a 49 y.o. female here today c/o B lower back pain.  She is concerned that the pain is coming from her kidneys.  She has been having this for several months.  She is on her period now.  Denies vaginal discharge.  No f/c.  Pain is constant.  Relieved somewhat with ibuprofen and robaxin.  No dysuria.  NKI.  Pain in B legs but no radiculopathy.    ROS:   Constitutional:  No f/c, No night sweats, No unexplained weight loss. EENT:  No vision changes, No blurry vision, No hearing changes. No mouth, throat, or ear problems.  Respiratory: No cough, No SOB Cardiac: No CP, no palpitations GI:  No abd pain, No N/V/D. GU: No Urinary s/sx Musculoskeletal: back pain Neuro: No headache, no dizziness, no motor weakness.  Skin: No rash Endocrine:  No polydipsia. No polyuria.  Psych: Denies SI/HI  No problems updated.  ALLERGIES: No Known Allergies  PAST MEDICAL HISTORY: Past Medical History:  Diagnosis Date  . Anxiety   . Bipolar affective ([place])   . Bipolar disorder, unspecified ([place]) 03/26/2013  . Calculus of gallbladder with acute cholecystitis, without mention of obstruction 03/26/2013  . Carpal tunnel syndrome   . GERD (gastroesophageal reflux disease) 1994  . GERD (gastroesophageal reflux disease) 03/26/2013  . Obesity   . Obesity, Class III, BMI 40-49.9 (morbid obesity) ([place]) 03/26/2013  . Ovarian cyst   . Panic attacks   . Rectal bleed    Hemorrhoids  . Seizures ([place])    last seizure in May 2015    MEDICATIONS AT HOME: Prior to Admission medications   Medication Sig Start Date End Date Taking? Authorizing Provider  acetaminophen-codeine (TYLENOL #3) 300-30 MG tablet Take 1 tablet by mouth every 12 (twelve) hours as needed for moderate pain. 01/22/19  Yes Charlott Rakes, MD  cetirizine (ZYRTEC) 10 MG tablet Take 1 tablet (10 mg total)  by mouth daily. 12/18/17  Yes Charlott Rakes, MD  divalproex (DEPAKOTE) 250 MG DR tablet Take 250 mg by mouth 2 (two) times daily.   Yes [provider]  escitalopram (LEXAPRO) 20 MG tablet TK 1 T PO QD FOR DEPRESSION 07/19/16  Yes [provider]  lisinopril (ZESTRIL) 5 MG tablet TAKE 1 TABLET BY MOUTH DAILY 12/31/18  Yes Newlin, Charlane Ferretti, MD  methocarbamol (ROBAXIN) 500 MG tablet Take 2 tablets (1,000 mg total) by mouth every 8 (eight) hours as needed for muscle spasms. 04/02/19  Yes Freeman Caldron M, PA-C  omeprazole (PRILOSEC) 20 MG capsule Take 1 capsule (20 mg total) by mouth daily. 10/24/18  Yes Fulp, Cammie, MD  prazosin (MINIPRESS) 5 MG capsule TK ONE C PO QD 07/19/16  Yes [provider]  sertraline (ZOLOFT) 100 MG tablet Take 100 mg by mouth 3 (three) times daily.   Yes [provider]  tinidazole (TINDAMAX) 500 MG tablet Take 2 tablets (1,000 mg total) by mouth daily with breakfast. 12/06/17  Yes Shelly Bombard, MD  topiramate (TOPAMAX) 50 MG tablet Take 1 tablet (50 mg total) by mouth 2 (two) times daily. 01/22/19  Yes Charlott Rakes, MD  ALPRAZolam (XANAX) 1 MG tablet Take 1 mg by mouth 3 (three) times daily.    [provider]  Aspirin-Acetaminophen-Caffeine (GOODY HEADACHE PO) Take 1 packet by mouth daily as needed (pain).    [provider]  naproxen (  NAPROSYN) 500 MG tablet Take 1 tablet (500 mg total) by mouth 2 (two) times daily with a meal. Prn pain 04/02/19   Argentina Donovan, PA-C  Phenyleph-CPM-DM-APAP (TYLENOL COLD MULTI-SYMPTOM) 06-28-08-325 MG TABS Take 2 tablets by mouth daily as needed (for cold).    [provider]     Objective:  EXAM:   Vitals:   04/02/19 1404  BP: 104/72  Pulse: 82  Temp: 98.5 F (36.9 C)  TempSrc: Oral  Weight: 266 lb (120.7 kg)  Height: 5' 5"  (1.651 m)    General appearance : A&OX3. NAD. Non-toxic-appearing HEENT: Atraumatic and Normocephalic.  PERRLA. EOM intact.    Chest/Lungs:  Breathing-non-labored, Good air entry bilaterally, breath sounds normal without rales, rhonchi, or wheezing  CVS: S1 S2 regular, no murmurs, gallops, rubs  No CVA TTP Back-paraspinus spasm B lower back.  Neg SLR B Extremities: Bilateral Lower Ext shows no edema, both legs are warm to touch with = pulse throughout Neurology:  CN II-XII grossly intact, Non focal.   Psych:  TP linear. J/I WNL. Normal speech. Appropriate eye contact and affect.  Skin:  No Rash  Data Review No results found for: HGBA1C   Assessment & Plan   1. Kidney pain - POCT URINALYSIS DIP (CLINITEK) - Basic metabolic panel - DG Lumbar Spine Complete; Future -increase water intake  2. Muscle spasm - methocarbamol (ROBAXIN) 500 MG tablet; Take 2 tablets (1,000 mg total) by mouth every 8 (eight) hours as needed for muscle spasms.  Dispense: 90 tablet; Refill: 1 - naproxen (NAPROSYN) 500 MG tablet; Take 1 tablet (500 mg total) by mouth 2 (two) times daily with a meal. Prn pain  Dispense: 60 tablet; Refill: 1 - DG Lumbar Spine Complete; Future  3. Low back pain without sciatica, unspecified back pain laterality, unspecified chronicity No red flags.  Consider referral  - DG Lumbar Spine Complete; Future   Patient have been counseled extensively about nutrition and exercise  Return for 3/1 2021 with Dr Margarita Rana as scheduled.  .  The patient was given clear instructions to go to ER or return to medical center if symptoms don't improve, worsen or new problems develop. The patient verbalized understanding. The patient was told to call to get lab results if they haven't heard anything in the next week.     Freeman Caldron, PA-C [facility] [place], [place]   04/02/2019, 2:31 PMPatient ID: Kathryn Larson, female   DOB: 04/03/1970, 50 y.o.   MRN: [id number]

## 2019-04-02 NOTE — Progress Notes (Signed)
Patient complains of back pain. Patient is sexually active.

## 2019-04-03 LAB — BASIC METABOLIC PANEL
BUN/Creatinine Ratio: 11 (ref 9–23)
BUN: 11 mg/dL (ref 6–24)
CO2: 20 mmol/L (ref 20–29)
Calcium: 9.7 mg/dL (ref 8.7–10.2)
Chloride: 107 mmol/L — ABNORMAL HIGH (ref 96–106)
Creatinine, Ser: 0.98 mg/dL (ref 0.57–1.00)
GFR calc Af Amer: 80 mL/min/{1.73_m2} (ref >59)
GFR calc non Af Amer: 69 mL/min/{1.73_m2} (ref >59)
Glucose: 79 mg/dL (ref 65–99)
Potassium: 4.4 mmol/L (ref 3.5–5.2)
Sodium: 140 mmol/L (ref 134–144)

## 2019-04-04 ENCOUNTER — Telehealth (INDEPENDENT_AMBULATORY_CARE_PROVIDER_SITE_OTHER): Payer: Self-pay

## 2019-04-04 NOTE — Telephone Encounter (Signed)
Patient returned call. Verified date of birth. She is aware that her kidneys are fine and her pain is likely musculoskeletal. Awaiting results from Xrays. Will call when those results are available. She verbalized understanding of results. Nat Christen, CMA

## 2019-04-04 NOTE — Telephone Encounter (Signed)
-----   Message from Argentina Donovan, [place] sent at 04/03/2019 10:08 AM EST ----- Please call patient.  Her kidney function looks fine.  I believe the pain she is having is musculoskeletal.  Awaiting her xrays.  Thanks, Freeman Caldron, PA-C

## 2019-04-28 ENCOUNTER — Ambulatory Visit: Payer: Medicaid Other | Attending: Family Medicine | Admitting: Family Medicine

## 2019-04-28 ENCOUNTER — Other Ambulatory Visit: Payer: Self-pay

## 2019-04-28 ENCOUNTER — Encounter: Payer: Self-pay | Admitting: Family Medicine

## 2019-04-28 DIAGNOSIS — F3132 Bipolar disorder, current episode depressed, moderate: Secondary | ICD-10-CM

## 2019-04-28 DIAGNOSIS — M25571 Pain in right ankle and joints of right foot: Secondary | ICD-10-CM | POA: Diagnosis not present

## 2019-04-28 DIAGNOSIS — G44229 Chronic tension-type headache, not intractable: Secondary | ICD-10-CM

## 2019-04-28 DIAGNOSIS — F419 Anxiety disorder, unspecified: Secondary | ICD-10-CM

## 2019-04-28 DIAGNOSIS — M545 Low back pain: Secondary | ICD-10-CM

## 2019-04-28 DIAGNOSIS — G8929 Other chronic pain: Secondary | ICD-10-CM

## 2019-04-28 MED ORDER — TOPIRAMATE 50 MG PO TABS: 50.0000 mg | ORAL_TABLET | Freq: Two times a day (BID) | ORAL | 3 refills | Status: DC

## 2019-04-28 MED ORDER — ACETAMINOPHEN-CODEINE #3 300-30 MG PO TABS: 1.0000 | ORAL_TABLET | Freq: Two times a day (BID) | ORAL | 0 refills | Status: DC | PRN

## 2019-04-28 NOTE — Progress Notes (Signed)
Pain in back and right ankle. States that she can not walk on the ankle.  Having panic attacks due to not having medications.

## 2019-04-28 NOTE — Progress Notes (Signed)
Virtual Visit via Telephone Note  I connected with Kathryn Larson, on 04/28/2019 at 1:38 PM by telephone due to the COVID-19 pandemic and verified that I am speaking with the correct person using two identifiers.   Consent: I discussed the limitations, risks, security and privacy concerns of performing an evaluation and management service by telephone and the availability of in person appointments. I also discussed with the patient that there may be a patient responsible charge related to this service. The patient expressed understanding and agreed to proceed.   Location of Patient: Home  Location of Provider: Clinic   Persons participating in Telemedicine visit: Aaleah Hirsch [place]-CMA Dr. Margarita Rana     History of Present Illness: Kathryn Larson is a 49 year old morbidly obese female with a medical history of hypertension, bipolar disorder, GERD who presents today for follow up visit.   She has pain in her right ankle and back after she took a fall 3 days ago while in the shower and has unable to ambulate and she did not seek medical attention.  She has swelling in her R ankle; previous h/o b/l ankle surgery. She lives alone but has a nurse 5 hrs/day mon through Sunday who assists with her ADLs. She has been applying an ice pack and ACE wrap to her ankle with the help of her nurse.  Her lower back still hurts and this sometimes radiates down her legs. Robaxin relaxes her back muscles but then pain returns when she lays on her back or when she turns. It crawls up her back and her side. She has been having anxiety attacks as she has been off Xanax and her Psychiatrist - Dr Rosine Door states he is unable to prescribe Xanax and other Controlled substances but she remains on her SSRI, Minipress, Depakote. Currently adherent with her antihypertensive. Migraines are controlled on current regimen.  Past Medical History:  Diagnosis Date  . Anxiety   . Bipolar affective ([place])    . Bipolar disorder, unspecified ([place]) 03/26/2013  . Calculus of gallbladder with acute cholecystitis, without mention of obstruction 03/26/2013  . Carpal tunnel syndrome   . GERD (gastroesophageal reflux disease) 1994  . GERD (gastroesophageal reflux disease) 03/26/2013  . Obesity   . Obesity, Class III, BMI 40-49.9 (morbid obesity) ([place]) 03/26/2013  . Ovarian cyst   . Panic attacks   . Rectal bleed    Hemorrhoids  . Seizures ([place])    last seizure in May 2015   No Known Allergies  Current Outpatient Medications on File Prior to Visit  Medication Sig Dispense Refill  . Aspirin-Acetaminophen-Caffeine (GOODY HEADACHE PO) Take 1 packet by mouth daily as needed (pain).    . cetirizine (ZYRTEC) 10 MG tablet Take 1 tablet (10 mg total) by mouth daily. 30 tablet 1  . divalproex (DEPAKOTE) 250 MG DR tablet Take 250 mg by mouth 2 (two) times daily.    Marland Kitchen escitalopram (LEXAPRO) 20 MG tablet TK 1 T PO QD FOR DEPRESSION  0  . lisinopril (ZESTRIL) 5 MG tablet TAKE 1 TABLET BY MOUTH DAILY 30 tablet 6  . methocarbamol (ROBAXIN) 500 MG tablet Take 2 tablets (1,000 mg total) by mouth every 8 (eight) hours as needed for muscle spasms. 90 tablet 1  . naproxen (NAPROSYN) 500 MG tablet Take 1 tablet (500 mg total) by mouth 2 (two) times daily with a meal. Prn pain 60 tablet 1  . omeprazole (PRILOSEC) 20 MG capsule Take 1 capsule (20 mg total) by mouth daily. 30 capsule 6  .  Phenyleph-CPM-DM-APAP (TYLENOL COLD MULTI-SYMPTOM) 06-28-08-325 MG TABS Take 2 tablets by mouth daily as needed (for cold).    . prazosin (MINIPRESS) 5 MG capsule TK ONE C PO QD  0  . sertraline (ZOLOFT) 100 MG tablet Take 100 mg by mouth 3 (three) times daily.    Marland Kitchen tinidazole (TINDAMAX) 500 MG tablet Take 2 tablets (1,000 mg total) by mouth daily with breakfast. 10 tablet 2  . topiramate (TOPAMAX) 50 MG tablet Take 1 tablet (50 mg total) by mouth 2 (two) times daily. 60 tablet 3  . acetaminophen-codeine (TYLENOL #3) 300-30 MG tablet Take 1  tablet by mouth every 12 (twelve) hours as needed for moderate pain. (Patient not taking: Reported on 04/28/2019) 60 tablet 0  . ALPRAZolam (XANAX) 1 MG tablet Take 1 mg by mouth 3 (three) times daily.     No current facility-administered medications on file prior to visit.    Observations/Objective: Alert, awake, oriented x3 Not in acute distress She is able to move her ankle.  Assessment and Plan: 1. Chronic tension-type headache, not intractable Controlled - topiramate (TOPAMAX) 50 MG tablet; Take 1 tablet (50 mg total) by mouth 2 (two) times daily.  Dispense: 60 tablet; Refill: 3  2. Chronic midline low back pain without sciatica Uncontrolled She has been out of Tylenol #3 which I have refilled She will benefit from PT but is skeptical due to the pandemic Advised to apply heat. - acetaminophen-codeine (TYLENOL #3) 300-30 MG tablet; Take 1 tablet by mouth every 12 (twelve) hours as needed for moderate pain.  Dispense: 60 tablet; Refill: 0  3. Acute right ankle pain Secondary to trauma I am concerned due to her inability to bear weight and applying the [place] rule she might need imaging  Advised that if weight bearing remains an issue she will need to have an ED visit Apply ice, use ACE wrap  4. Anxiety Uncontrolled Previously on Xanax from Psychiatry which she has been unable to obtain Advised to discuss possibility of initiating Hydroxyzine or Buspar with Psych  5. Bipolar affective disorder, currently depressed, moderate ([place]) Worsened by ongoing Pandemic Management as per Psych   Follow Up Instructions: Return in about 3 months (around 07/29/2019), or if symptoms worsen or fail to improve, for chronic medical conditions.    I discussed the assessment and treatment plan with the patient. The patient was provided an opportunity to ask questions and all were answered. The patient agreed with the plan and demonstrated an understanding of the instructions.   The patient was  advised to call back or seek an in-person evaluation if the symptoms worsen or if the condition fails to improve as anticipated.     I provided 17 minutes total of non-face-to-face time during this encounter including median intraservice time, reviewing previous notes, investigations, ordering medications, medical decision making, coordinating care and patient verbalized understanding at the end of the visit.     Charlott Rakes, MD, FAAFP. [facility] and [place], [place]   04/28/2019, 1:38 PM

## 2019-07-22 ENCOUNTER — Emergency Department (HOSPITAL_COMMUNITY): Payer: Medicaid Other

## 2019-07-22 ENCOUNTER — Encounter (HOSPITAL_COMMUNITY): Payer: Self-pay | Admitting: Emergency Medicine

## 2019-07-22 ENCOUNTER — Emergency Department (HOSPITAL_COMMUNITY)
Admission: EM | Admit: 2019-07-22 | Discharge: 2019-07-22 | Disposition: A | Discharge status: Home / Self Care | Payer: Medicaid Other | Attending: Emergency Medicine | Admitting: Emergency Medicine

## 2019-07-22 DIAGNOSIS — N83201 Unspecified ovarian cyst, right side: Secondary | ICD-10-CM | POA: Diagnosis not present

## 2019-07-22 DIAGNOSIS — R079 Chest pain, unspecified: Secondary | ICD-10-CM

## 2019-07-22 DIAGNOSIS — I10 Essential (primary) hypertension: Secondary | ICD-10-CM | POA: Diagnosis not present

## 2019-07-22 DIAGNOSIS — F17210 Nicotine dependence, cigarettes, uncomplicated: Secondary | ICD-10-CM | POA: Diagnosis not present

## 2019-07-22 DIAGNOSIS — R519 Headache, unspecified: Secondary | ICD-10-CM | POA: Diagnosis not present

## 2019-07-22 DIAGNOSIS — R102 Pelvic and perineal pain: Secondary | ICD-10-CM | POA: Diagnosis not present

## 2019-07-22 DIAGNOSIS — Z79899 Other long term (current) drug therapy: Secondary | ICD-10-CM | POA: Insufficient documentation

## 2019-07-22 DIAGNOSIS — R109 Unspecified abdominal pain: Secondary | ICD-10-CM | POA: Diagnosis present

## 2019-07-22 LAB — BASIC METABOLIC PANEL
Anion gap: 8 (ref 5–15)
BUN: 6 mg/dL (ref 6–20)
CO2: 26 mmol/L (ref 22–32)
Calcium: 8.9 mg/dL (ref 8.9–10.3)
Chloride: 105 mmol/L (ref 98–111)
Creatinine, Ser: 0.88 mg/dL (ref 0.44–1.00)
GFR calc Af Amer: >60 mL/min (ref >60)
GFR calc non Af Amer: >60 mL/min (ref >60)
Glucose, Bld: 95 mg/dL (ref 70–99)
Potassium: 4.3 mmol/L (ref 3.5–5.1)
Sodium: 139 mmol/L (ref 135–145)

## 2019-07-22 LAB — CBC
HCT: 41.9 % (ref 36.0–46.0)
Hemoglobin: 13.9 g/dL (ref 12.0–15.0)
MCH: 31.3 pg (ref 26.0–34.0)
MCHC: 33.2 g/dL (ref 30.0–36.0)
MCV: 94.4 fL (ref 80.0–100.0)
Platelets: 362 10*3/uL (ref 150–400)
RBC: 4.44 MIL/uL (ref 3.87–5.11)
RDW: 14.2 % (ref 11.5–15.5)
WBC: 8.9 10*3/uL (ref 4.0–10.5)
nRBC: 0 % (ref 0.0–0.2)

## 2019-07-22 LAB — URINALYSIS, ROUTINE W REFLEX MICROSCOPIC
Bilirubin Urine: NEGATIVE
Glucose, UA: NEGATIVE mg/dL
Hgb urine dipstick: NEGATIVE
Ketones, ur: NEGATIVE mg/dL
Leukocytes,Ua: NEGATIVE
Nitrite: NEGATIVE
Protein, ur: NEGATIVE mg/dL
Specific Gravity, Urine: 1.01 (ref 1.005–1.030)
pH: 7 (ref 5.0–8.0)

## 2019-07-22 LAB — I-STAT BETA HCG BLOOD, ED (MC, WL, AP ONLY): I-stat hCG, quantitative: <5 m[IU]/mL (ref <5)

## 2019-07-22 LAB — TROPONIN I (HIGH SENSITIVITY)
Troponin I (High Sensitivity): 2 ng/L (ref <18)
Troponin I (High Sensitivity): 2 ng/L (ref <18)

## 2019-07-22 LAB — WET PREP, GENITAL
Clue Cells Wet Prep HPF POC: NONE SEEN
Sperm: NONE SEEN
Trich, Wet Prep: NONE SEEN
WBC, Wet Prep HPF POC: NONE SEEN
Yeast Wet Prep HPF POC: NONE SEEN

## 2019-07-22 MED ORDER — PROCHLORPERAZINE EDISYLATE 10 MG/2ML IJ SOLN
10.0000 mg | Freq: Once | INTRAMUSCULAR | Status: AC
  Administered 2019-07-22: 10 mg via INTRAVENOUS
  Filled 2019-07-22: qty 2

## 2019-07-22 MED ORDER — ONDANSETRON HCL 4 MG/2ML IJ SOLN
4.0000 mg | Freq: Once | INTRAMUSCULAR | Status: AC
  Administered 2019-07-22: 4 mg via INTRAVENOUS
  Filled 2019-07-22: qty 2

## 2019-07-22 MED ORDER — SODIUM CHLORIDE 0.9 % IV BOLUS
1000.0000 mL | Freq: Once | INTRAVENOUS | Status: AC
  Administered 2019-07-22: 1000 mL via INTRAVENOUS

## 2019-07-22 MED ORDER — MORPHINE SULFATE (PF) 2 MG/ML IV SOLN
2.0000 mg | Freq: Once | INTRAVENOUS | Status: AC
  Administered 2019-07-22: 2 mg via INTRAVENOUS
  Filled 2019-07-22: qty 1

## 2019-07-22 MED ORDER — DIPHENHYDRAMINE HCL 50 MG/ML IJ SOLN
25.0000 mg | Freq: Once | INTRAMUSCULAR | Status: AC
  Administered 2019-07-22: 25 mg via INTRAVENOUS
  Filled 2019-07-22: qty 1

## 2019-07-22 NOTE — ED Provider Notes (Signed)
[facility] DEPT Provider Note   CSN: [id number] Arrival date & time: 07/22/19  1234     History No chief complaint on file.   Kathryn Larson is a 49 y.o. female.  HPI 49 year old female history of GERD, bipolar disorder, obesity, hypertension, ovarian cysts presents to the ER for right-sided flank pain and sudden onset of headache this morning.  Patient refers "her ovaries hurt", stating that she has had right flank pain and lower abdominal pain for the last several days.  States that her menstrual cycle is due to start in the next few days.  She also notes that this morning she woke up with a sudden severe headache that made her not want to move, endorses 2 episodes of nonbloody nonbilious vomiting associated with a headache.  She states that her head ""all over" and is not localized to any area.  Endorses worst headache of her life.  She does not have a history of migraines.  No vision changes, but states that when EMS arrived she had some right-sided numbness down her arm and they stated that she may have been having a stroke.  She is not on anticoagulation.  She does not she denies any vaginalbleeding, discharge, pain.  States she is sexually active with 1 partner, and they do not use protection.  She also endorses some chest pain and shortness of breath associated with her headache.  She denies any recent falls, syncope, dysuria, hematuria, diarrhea, constipation, neck stiffness, sensitivity to light.    Past Medical History:  Diagnosis Date  . Anxiety   . Bipolar affective ([place])   . Bipolar disorder, unspecified ([place]) 03/26/2013  . Calculus of gallbladder with acute cholecystitis, without mention of obstruction 03/26/2013  . Carpal tunnel syndrome   . GERD (gastroesophageal reflux disease) 1994  . GERD (gastroesophageal reflux disease) 03/26/2013  . Obesity   . Obesity, Class III, BMI 40-49.9 (morbid obesity) ([place]) 03/26/2013  . Ovarian cyst   . Panic  attacks   . Rectal bleed    Hemorrhoids  . Seizures ([place])    last seizure in May 2015    Patient Active Problem List   Diagnosis Date Noted  . Seizures ([place]) 09/28/2016  . Chronic low back pain without sciatica 09/28/2016  . Headache 09/28/2016  . Essential hypertension 11/19/2015  . Ganglion cyst 11/19/2015  . Frequent falls 11/19/2015  . Pain in joint, ankle and foot 11/19/2015  . Calculus of gallbladder with acute cholecystitis, without mention of obstruction 03/26/2013  . GERD (gastroesophageal reflux disease) 03/26/2013  . Bipolar disorder, unspecified ([place]) 03/26/2013  . Obesity, Class III, BMI 40-49.9 (morbid obesity) ([place]) 03/26/2013    Past Surgical History:  Procedure Laterality Date  . ANKLE FRACTURE SURGERY     Bilateral - right 2013, left 2010  . CHOLECYSTECTOMY N/A 03/24/2013   Procedure: LAPAROSCOPIC CHOLECYSTECTOMY;  Surgeon: [name], MD;  Location: [facility] OR;  Service: General;  Laterality: N/A;  . ECTOPIC PREGNANCY SURGERY       OB History    Gravida  6   Para  2   Term  2   Preterm      AB  4   Living  2     SAB  4   TAB      Ectopic      Multiple      Live Births  2           Family History  Problem Relation Age of Onset  .  Diabetes Mother   . Diabetes Sister   . Cancer Maternal Grandmother     Social History   Tobacco Use  . Smoking status: Current Every Day Smoker    Packs/day: 0.25    Types: Cigarettes  . Smokeless tobacco: Never Used  Substance Use Topics  . Alcohol use: Yes    Alcohol/week: 0.0 standard drinks    Comment: occ.  . Drug use: No    Home Medications Prior to Admission medications   Medication Sig Start Date End Date Taking? Authorizing Provider  acetaminophen-codeine (TYLENOL #3) 300-30 MG tablet Take 1 tablet by mouth every 12 (twelve) hours as needed for moderate pain. 04/28/19  Yes Charlott Rakes, MD  ALPRAZolam (XANAX) 1 MG tablet Take 1 mg by mouth 3 (three) times daily.   Yes [provider]  cetirizine (ZYRTEC) 10 MG tablet Take 1 tablet (10 mg total) by mouth daily. 12/18/17  Yes Charlott Rakes, MD  divalproex (DEPAKOTE ER) 500 MG 24 hr tablet Take 500 mg by mouth 2 (two) times daily. 04/09/19  Yes [provider]  escitalopram (LEXAPRO) 20 MG tablet Take 20 mg by mouth daily.  07/19/16  Yes [provider]  hydrOXYzine (VISTARIL) 50 MG capsule Take 100 mg by mouth every 8 (eight) hours as needed for anxiety or itching.  04/09/19  Yes [provider]  lisinopril (ZESTRIL) 5 MG tablet TAKE 1 TABLET BY MOUTH DAILY Patient taking differently: Take 5 mg by mouth daily.  12/31/18  Yes Charlott Rakes, MD  methocarbamol (ROBAXIN) 500 MG tablet Take 2 tablets (1,000 mg total) by mouth every 8 (eight) hours as needed for muscle spasms. 04/02/19  Yes Freeman Caldron M, PA-C  naproxen (NAPROSYN) 500 MG tablet Take 1 tablet (500 mg total) by mouth 2 (two) times daily with a meal. Prn pain 04/02/19  Yes McClung, Angela M, PA-C  omeprazole (PRILOSEC) 20 MG capsule Take 1 capsule (20 mg total) by mouth daily. 10/24/18  Yes Fulp, Cammie, MD  prazosin (MINIPRESS) 5 MG capsule Take 5 mg by mouth daily.  07/19/16  Yes [provider]  sertraline (ZOLOFT) 100 MG tablet Take 100 mg by mouth 3 (three) times daily.   Yes [provider]  tinidazole (TINDAMAX) 500 MG tablet Take 2 tablets (1,000 mg total) by mouth daily with breakfast. 12/06/17  Yes Shelly Bombard, MD  topiramate (TOPAMAX) 50 MG tablet Take 1 tablet (50 mg total) by mouth 2 (two) times daily. 04/28/19  Yes Charlott Rakes, MD  ziprasidone (GEODON) 80 MG capsule Take 80 mg by mouth 2 (two) times daily. 04/15/19  Yes [provider]    Allergies    Patient has no known allergies.  Review of Systems   Review of Systems  Constitutional: Negative for chills and fever.  HENT: Negative for ear pain and sore throat.   Eyes: Negative for pain and visual disturbance.  Respiratory:  Positive for shortness of breath. Negative for cough.   Cardiovascular: Positive for chest pain. Negative for palpitations.  Gastrointestinal: Negative for abdominal pain and vomiting.  Genitourinary: Positive for flank pain. Negative for dysuria, hematuria, vaginal bleeding, vaginal discharge and vaginal pain.  Musculoskeletal: Positive for back pain. Negative for arthralgias, myalgias, neck pain and neck stiffness.  Skin: Negative for color change and rash.  Neurological: Negative for seizures and syncope.  All other systems reviewed and are negative.   Physical Exam Updated Vital Signs BP 131/72   Pulse (!) 54   Temp 98  F (36.7 C) (Oral)   Resp 14   SpO2 98%   Physical Exam Vitals and nursing note reviewed. Exam conducted with a chaperone present.  Constitutional:      General: She is not in acute distress.    Appearance: She is well-developed. She is obese. She is not ill-appearing, toxic-appearing or diaphoretic.  HENT:     Head: Normocephalic and atraumatic.     Nose: Nose normal.     Mouth/Throat:     Mouth: Mucous membranes are moist.     Pharynx: Oropharynx is clear.  Eyes:     Extraocular Movements: Extraocular movements intact.     Conjunctiva/sclera: Conjunctivae normal.     Pupils: Pupils are equal, round, and reactive to light.  Neck:     Comments: Negative Kernig and Brudzinski's Cardiovascular:     Rate and Rhythm: Normal rate and regular rhythm.     Pulses: Normal pulses.     Heart sounds: Normal heart sounds. No murmur.  Pulmonary:     Effort: Pulmonary effort is normal. No respiratory distress.     Breath sounds: Normal breath sounds.  Abdominal:     General: Abdomen is flat. Bowel sounds are normal. There is no distension.     Palpations: Abdomen is soft.     Tenderness: There is abdominal tenderness in the right lower quadrant, suprapubic area and left lower quadrant. There is right CVA tenderness and left CVA tenderness. There is no guarding.  Negative signs include Murphy's sign and McBurney's sign.     Hernia: No hernia is present. There is no hernia in the left inguinal area or right inguinal area.  Genitourinary:    Vagina: Vaginal discharge present. No tenderness.     Cervix: No cervical motion tenderness, discharge or erythema.     Uterus: Normal.      Adnexa:        Right: Tenderness present.        Left: Tenderness present.   Musculoskeletal:        General: No swelling or tenderness. Normal range of motion.     Cervical back: Normal range of motion and neck supple. No rigidity or tenderness.     Right lower leg: No edema.     Left lower leg: No edema.  Skin:    General: Skin is warm and dry.     Capillary Refill: Capillary refill takes less than 2 seconds.     Findings: No erythema or rash.  Neurological:     General: No focal deficit present.     Mental Status: She is alert and oriented to person, place, and time.     Comments: Mental Status:  Alert, thought content appropriate, able to give a coherent history. Speech fluent without evidence of aphasia. Able to follow 2 step commands without difficulty.  Cranial Nerves:  II: Peripheral visual fields grossly normal, pupils equal, round, reactive to light III,IV, VI: ptosis not present, extra-ocular motions intact bilaterally  V,VII: smile symmetric, facial light touch sensation equal VIII: hearing grossly normal to voice  X: uvula elevates symmetrically  XI: bilateral shoulder shrug symmetric and strong XII: midline tongue extension without fassiculations Motor:  Normal tone. 5/5 strength of BUE and BLE major muscle groups including strong and equal grip strength and dorsiflexion/plantar flexion Sensory: light touch normal in all extremities. Cerebellar: normal finger-to-nose with bilateral upper extremities, Romberg sign absent Gait: normal gait and balance. Able to walk on toes and heels with ease.    Psychiatric:  Mood and Affect: Mood normal.         Behavior: Behavior normal.     ED Results / Procedures / Treatments   Labs (all labs ordered are listed, but only abnormal results are displayed) Labs Reviewed  WET PREP, GENITAL  URINALYSIS, ROUTINE W REFLEX MICROSCOPIC  BASIC METABOLIC PANEL  CBC  I-STAT BETA HCG BLOOD, ED ([facility], [facility], [facility] ONLY)  GC/CHLAMYDIA PROBE AMP ([place]) NOT AT [facility]  TROPONIN I (HIGH SENSITIVITY)  TROPONIN I (HIGH SENSITIVITY)    EKG None  Radiology DG Chest 1 View  Result Date: 07/22/2019 CLINICAL DATA:  Right flank pain. EXAM: CHEST  1 VIEW COMPARISON:  May 16, 2014. FINDINGS: The heart size and mediastinal contours are within normal limits. Both lungs are clear. No pneumothorax or pleural effusion is noted. The visualized skeletal structures are unremarkable. IMPRESSION: No active disease. Electronically Signed   By: Marijo Conception M.D.   On: 07/22/2019 14:25   CT Head Wo Contrast  Result Date: 07/22/2019 CLINICAL DATA:  Headache for a couple of weeks.  No injury. EXAM: CT HEAD WITHOUT CONTRAST TECHNIQUE: Contiguous axial images were obtained from the base of the skull through the vertex without intravenous contrast. COMPARISON:  None. FINDINGS: Brain: No evidence of acute infarction, hemorrhage, hydrocephalus, extra-axial collection or mass lesion/mass effect. Vascular: No hyperdense vessel or unexpected calcification. Skull: Normal. Negative for fracture or focal lesion. Sinuses/Orbits: Normal globes and orbits. Visualized sinuses are clear. Other: None. IMPRESSION: Normal unenhanced CT scan of the brain. Electronically Signed   By: Lajean Manes M.D.   On: 07/22/2019 14:18   US Transvaginal Non-OB  Result Date: 07/22/2019 CLINICAL DATA:  Lower abdominal and pelvic pain, history of ovarian cysts and LEFT ectopic pregnancy; LMP 07/02/2019 EXAM: TRANSABDOMINAL AND TRANSVAGINAL ULTRASOUND OF PELVIS DOPPLER ULTRASOUND OF OVARIES TECHNIQUE: Both transabdominal and transvaginal ultrasound examinations of  the pelvis were performed. Transabdominal technique was performed for global imaging of the pelvis including uterus, ovaries, adnexal regions, and pelvic cul-de-sac. It was necessary to proceed with endovaginal exam following the transabdominal exam to visualize the endometrium and LEFT ovary. Color and duplex Doppler ultrasound was utilized to evaluate blood flow to the ovaries. COMPARISON:  05/16/2014 FINDINGS: Uterus Measurements: 8.3 x 4.0 x 4.2 cm = volume: 72 mL. Anteverted. Nabothian cysts at cervix. Otherwise normal morphology without mass. Endometrium Thickness: 10 mm.  Normal appearance without mass or fluid Right ovary Measurements: 5.1 x 3.9 x 3.5 cm = volume: 45 mL. Simple cyst seen within RIGHT ovary, 4.3 x 3.3 x 3.4 cm. No complicating features. Left ovary Measurements: 2.0 x 2.0 x 1.2 cm = volume: 2.6 mL. Normal morphology without mass Pulsed Doppler evaluation of both ovaries demonstrates low resistance arterial and venous waveforms in both ovaries. Other findings No free pelvic fluid or additional adnexal masses. IMPRESSION: 4.3 cm diameter simple appearing cyst within RIGHT ovary. Otherwise normal exam. Electronically Signed   By: Lavonia Dana M.D.   On: 07/22/2019 15:54   US Pelvis Complete  Result Date: 07/22/2019 CLINICAL DATA:  Lower abdominal and pelvic pain, history of ovarian cysts and LEFT ectopic pregnancy; LMP 07/02/2019 EXAM: TRANSABDOMINAL AND TRANSVAGINAL ULTRASOUND OF PELVIS DOPPLER ULTRASOUND OF OVARIES TECHNIQUE: Both transabdominal and transvaginal ultrasound examinations of the pelvis were performed. Transabdominal technique was performed for global imaging of the pelvis including uterus, ovaries, adnexal regions, and pelvic cul-de-sac. It was necessary to proceed with endovaginal exam following the transabdominal exam to visualize the endometrium and LEFT ovary.  Color and duplex Doppler ultrasound was utilized to evaluate blood flow to the ovaries. COMPARISON:  05/16/2014  FINDINGS: Uterus Measurements: 8.3 x 4.0 x 4.2 cm = volume: 72 mL. Anteverted. Nabothian cysts at cervix. Otherwise normal morphology without mass. Endometrium Thickness: 10 mm.  Normal appearance without mass or fluid Right ovary Measurements: 5.1 x 3.9 x 3.5 cm = volume: 45 mL. Simple cyst seen within RIGHT ovary, 4.3 x 3.3 x 3.4 cm. No complicating features. Left ovary Measurements: 2.0 x 2.0 x 1.2 cm = volume: 2.6 mL. Normal morphology without mass Pulsed Doppler evaluation of both ovaries demonstrates low resistance arterial and venous waveforms in both ovaries. Other findings No free pelvic fluid or additional adnexal masses. IMPRESSION: 4.3 cm diameter simple appearing cyst within RIGHT ovary. Otherwise normal exam. Electronically Signed   By: Lavonia Dana M.D.   On: 07/22/2019 15:54   [place] Art/Ven Flow Abd Pelv Doppler  Result Date: 07/22/2019 CLINICAL DATA:  Lower abdominal and pelvic pain, history of ovarian cysts and LEFT ectopic pregnancy; LMP 07/02/2019 EXAM: TRANSABDOMINAL AND TRANSVAGINAL ULTRASOUND OF PELVIS DOPPLER ULTRASOUND OF OVARIES TECHNIQUE: Both transabdominal and transvaginal ultrasound examinations of the pelvis were performed. Transabdominal technique was performed for global imaging of the pelvis including uterus, ovaries, adnexal regions, and pelvic cul-de-sac. It was necessary to proceed with endovaginal exam following the transabdominal exam to visualize the endometrium and LEFT ovary. Color and duplex Doppler ultrasound was utilized to evaluate blood flow to the ovaries. COMPARISON:  05/16/2014 FINDINGS: Uterus Measurements: 8.3 x 4.0 x 4.2 cm = volume: 72 mL. Anteverted. Nabothian cysts at cervix. Otherwise normal morphology without mass. Endometrium Thickness: 10 mm.  Normal appearance without mass or fluid Right ovary Measurements: 5.1 x 3.9 x 3.5 cm = volume: 45 mL. Simple cyst seen within RIGHT ovary, 4.3 x 3.3 x 3.4 cm. No complicating features. Left ovary Measurements: 2.0 x  2.0 x 1.2 cm = volume: 2.6 mL. Normal morphology without mass Pulsed Doppler evaluation of both ovaries demonstrates low resistance arterial and venous waveforms in both ovaries. Other findings No free pelvic fluid or additional adnexal masses. IMPRESSION: 4.3 cm diameter simple appearing cyst within RIGHT ovary. Otherwise normal exam. Electronically Signed   By: Lavonia Dana M.D.   On: 07/22/2019 15:54    Procedures Procedures (including critical care time)  Medications Ordered in ED Medications  prochlorperazine (COMPAZINE) injection 10 mg (10 mg Intravenous Given 07/22/19 1430)  diphenhydrAMINE (BENADRYL) injection 25 mg (25 mg Intravenous Given 07/22/19 1430)  morphine 2 MG/ML injection 2 mg (2 mg Intravenous Given 07/22/19 1430)  sodium chloride 0.9 % bolus 1,000 mL (0 mLs Intravenous Stopped 07/22/19 1707)  ondansetron (ZOFRAN) injection 4 mg (4 mg Intravenous Given 07/22/19 1430)    ED Course  I have reviewed the triage vital signs and the nursing notes.  Pertinent labs & imaging results that were available during my care of the patient were reviewed by me and considered in my medical decision making (see chart for details).    MDM Rules/Calculators/A&P                      49 year old female with right flank pain, abdominal pain, headache. On presentation to the ER, the patient is alert and oriented, nontoxic-appearing, speaking in full sentences without increased work of breathing.  Vitals overall reassuring.  No focal neuro deficits on exam.  She does have some generalized lower abdominal tenderness and right flank pain.  Pelvic exam with no  cervical motion tenderness, right and left adnexal tenderness and discharge.  CBC without leukocytosis, normal hemoglobin.  BMP without significant electrolyte abnormalities, normal BUN/creatinine.  Initial troponin 2. EKG normal sinus rhythm.  Wet prep without evidence yeast, trichomoniasis, clue cells.  Chest x-ray without acute abnormalities.  UA  without evidence of UTI.  Given patient was endorsing sudden onset of headache and "worst headache of her life", ordered CT which was negative for bleeds.  Transvaginal ultrasound of pelvis pending.  Patient treated with migraine cocktail, morphine, Zofran and fluid bolus.  Ultrasound with right simple ovarian cyst.   Otherwise normal exam.  Overall work-up reassuring.  Doubt shortness of breath secondary to PE as the patient is not tachypneic, tachycardic, sats 98% throughout ED course.  GC pending, informed the patient to follow-up with the results on my chart and seek treatment if positive.  Reports significant improvement in headache, serial abdominal reexamination showed significant improvement in abdominal pain.  I encouraged the patient to follow-up with her PCP about her headaches and OB/GYN for the simple cyst.  Patient is overall reassured by the work-up voices understanding and is agreeable to the plan.  Return precautions given.  At this stage in ED course, the patient has been adequately screened and is stable for discharge.   Final Clinical Impression(s) / ED Diagnoses   Final diagnoses:  Cyst of right ovary  Acute nonintractable headache, unspecified headache type    Rx / DC Orders ED Discharge Orders    None       Lyndel Safe 07/22/19 1725    Carmin Muskrat, MD 07/23/19 1100

## 2019-07-22 NOTE — ED Notes (Signed)
US at bedside

## 2019-07-22 NOTE — ED Triage Notes (Addendum)
Patient here from home home reporting right sided flank pain and headache that started this morning. Denies urinary symptoms. Pain 10/10.

## 2019-07-22 NOTE — ED Notes (Signed)
Transported to CT at this time.

## 2019-07-22 NOTE — Discharge Instructions (Addendum)
Please follow-up with your primary care provider.  Follow-up with your OB/GYN about the cyst on your right ovary.  Overall though your work-up today was very reassuring.  Please take your prescribed medication for headaches.  Return to the ER if your symptoms worsen.

## 2019-07-23 LAB — GC/CHLAMYDIA PROBE AMP (~~LOC~~) NOT AT ARMC
Chlamydia: NEGATIVE
Comment: NEGATIVE
Comment: NORMAL
Neisseria Gonorrhea: NEGATIVE

## 2019-07-24 ENCOUNTER — Other Ambulatory Visit: Payer: Self-pay

## 2019-07-24 ENCOUNTER — Encounter: Payer: Self-pay | Admitting: Obstetrics

## 2019-07-24 ENCOUNTER — Ambulatory Visit (INDEPENDENT_AMBULATORY_CARE_PROVIDER_SITE_OTHER): Payer: Medicaid Other | Admitting: Obstetrics

## 2019-07-24 ENCOUNTER — Other Ambulatory Visit (HOSPITAL_COMMUNITY)
Admission: RE | Admit: 2019-07-24 | Discharge: 2019-07-24 | Disposition: A | Discharge status: Home / Self Care | Payer: Medicaid Other | Source: Ambulatory Visit | Attending: Obstetrics | Admitting: Obstetrics

## 2019-07-24 VITALS — BP 94/70 | HR 101 | Ht 64.0 in | Wt 271.7 lb

## 2019-07-24 DIAGNOSIS — N898 Other specified noninflammatory disorders of vagina: Secondary | ICD-10-CM

## 2019-07-24 DIAGNOSIS — Z113 Encounter for screening for infections with a predominantly sexual mode of transmission: Secondary | ICD-10-CM

## 2019-07-24 DIAGNOSIS — N944 Primary dysmenorrhea: Secondary | ICD-10-CM | POA: Diagnosis not present

## 2019-07-24 DIAGNOSIS — Z1239 Encounter for other screening for malignant neoplasm of breast: Secondary | ICD-10-CM

## 2019-07-24 DIAGNOSIS — N83201 Unspecified ovarian cyst, right side: Secondary | ICD-10-CM

## 2019-07-24 DIAGNOSIS — Z01419 Encounter for gynecological examination (general) (routine) without abnormal findings: Secondary | ICD-10-CM | POA: Insufficient documentation

## 2019-07-24 DIAGNOSIS — Z01411 Encounter for gynecological examination (general) (routine) with abnormal findings: Secondary | ICD-10-CM | POA: Diagnosis not present

## 2019-07-24 DIAGNOSIS — Z3009 Encounter for other general counseling and advice on contraception: Secondary | ICD-10-CM

## 2019-07-24 DIAGNOSIS — Z6841 Body Mass Index (BMI) 40.0 and over, adult: Secondary | ICD-10-CM

## 2019-07-24 DIAGNOSIS — Z1272 Encounter for screening for malignant neoplasm of vagina: Secondary | ICD-10-CM

## 2019-07-24 MED ORDER — NORETHIN ACE-ETH ESTRAD-FE 1-20 MG-MCG(24) PO TABS: 1.0000 | ORAL_TABLET | Freq: Every day | ORAL | 11 refills | Status: DC

## 2019-07-24 MED ORDER — IBUPROFEN 800 MG PO TABS: 800.0000 mg | ORAL_TABLET | Freq: Three times a day (TID) | ORAL | 5 refills | Status: DC | PRN

## 2019-07-24 NOTE — Progress Notes (Signed)
Pt presents f/u ovarian cyst, annual, pap, and all STD testing.  GAD = 7 - Taking Zoloft  Recent u/s shows R ovarian cyst

## 2019-07-24 NOTE — Progress Notes (Addendum)
Subjective:        Kathryn Larson is a 49 y.o. female here for a routine exam.  Current complaints: Pelvic pain - RLQ.  Has a history of ovarian cysts.  Personal health questionnaire:  Is patient Ashkenazi Jewish, have a family history of breast and/or ovarian cancer: no Is there a family history of uterine cancer diagnosed at age < 71, gastrointestinal cancer, urinary tract cancer, family member who is a Field seismologist syndrome-associated carrier: no Is the patient overweight and hypertensive, family history of diabetes, personal history of gestational diabetes, preeclampsia or PCOS: yes Is patient over 65, have PCOS,  family history of premature CHD under age 66, diabetes, smoke, have hypertension or peripheral artery disease:  no At any time, has a partner hit, kicked or otherwise hurt or frightened you?: no Over the past 2 weeks, have you felt down, depressed or hopeless?: no Over the past 2 weeks, have you felt little interest or pleasure in doing things?:no   Gynecologic History No LMP recorded. (Menstrual status: Irregular Periods). Contraception: none Last Pap: 12-04-2017. Results were: ASCUS with negative HPV Last mammogram: 2020. Results were: normal  Obstetric History OB History  Gravida Para Term Preterm AB Living  6 2 2   4 2   SAB TAB Ectopic Multiple Live Births  4       2    # Outcome Date GA Lbr Len/2nd Weight Sex Delivery Anes PTL Lv  6 Term 10/02/92        LIV  5 Term 04/15/87        LIV  4 SAB           3 SAB           2 SAB           1 SAB             Past Medical History:  Diagnosis Date  . Anxiety   . Bipolar affective ([place])   . Bipolar disorder, unspecified ([place]) 03/26/2013  . Calculus of gallbladder with acute cholecystitis, without mention of obstruction 03/26/2013  . Carpal tunnel syndrome   . GERD (gastroesophageal reflux disease) 1994  . GERD (gastroesophageal reflux disease) 03/26/2013  . Obesity   . Obesity, Class III, BMI 40-49.9 (morbid  obesity) ([place]) 03/26/2013  . Ovarian cyst   . Panic attacks   . Rectal bleed    Hemorrhoids  . Seizures ([place])    last seizure in May 2015    Past Surgical History:  Procedure Laterality Date  . ANKLE FRACTURE SURGERY     Bilateral - right 2013, left 2010  . CHOLECYSTECTOMY N/A 03/24/2013   Procedure: LAPAROSCOPIC CHOLECYSTECTOMY;  Surgeon: [name], MD;  Location: [facility];  Service: General;  Laterality: N/A;  . ECTOPIC PREGNANCY SURGERY       Current Outpatient Medications:  .  acetaminophen-codeine (TYLENOL #3) 300-30 MG tablet, Take 1 tablet by mouth every 12 (twelve) hours as needed for moderate pain., Disp: 60 tablet, Rfl: 0 .  ALPRAZolam (XANAX) 1 MG tablet, Take 1 mg by mouth 3 (three) times daily., Disp: , Rfl:  .  cetirizine (ZYRTEC) 10 MG tablet, Take 1 tablet (10 mg total) by mouth daily., Disp: 30 tablet, Rfl: 1 .  divalproex (DEPAKOTE ER) 500 MG 24 hr tablet, Take 500 mg by mouth 2 (two) times daily., Disp: , Rfl:  .  escitalopram (LEXAPRO) 20 MG tablet, Take 20 mg by mouth daily. , Disp: , Rfl: 0 .  hydrOXYzine (VISTARIL) 50 MG capsule, Take 100 mg by mouth every 8 (eight) hours as needed for anxiety or itching. , Disp: , Rfl:  .  lisinopril (ZESTRIL) 5 MG tablet, TAKE 1 TABLET BY MOUTH DAILY (Patient taking differently: Take 5 mg by mouth daily. ), Disp: 30 tablet, Rfl: 6 .  methocarbamol (ROBAXIN) 500 MG tablet, Take 2 tablets (1,000 mg total) by mouth every 8 (eight) hours as needed for muscle spasms., Disp: 90 tablet, Rfl: 1 .  naproxen (NAPROSYN) 500 MG tablet, Take 1 tablet (500 mg total) by mouth 2 (two) times daily with a meal. Prn pain, Disp: 60 tablet, Rfl: 1 .  omeprazole (PRILOSEC) 20 MG capsule, Take 1 capsule (20 mg total) by mouth daily., Disp: 30 capsule, Rfl: 6 .  prazosin (MINIPRESS) 5 MG capsule, Take 5 mg by mouth daily. , Disp: , Rfl: 0 .  sertraline (ZOLOFT) 100 MG tablet, Take 100 mg by mouth 3 (three) times daily., Disp: , Rfl:  .  topiramate  (TOPAMAX) 50 MG tablet, Take 1 tablet (50 mg total) by mouth 2 (two) times daily., Disp: 60 tablet, Rfl: 3 .  ziprasidone (GEODON) 80 MG capsule, Take 80 mg by mouth 2 (two) times daily., Disp: , Rfl:  .  ibuprofen (ADVIL) 800 MG tablet, Take 1 tablet (800 mg total) by mouth every 8 (eight) hours as needed., Disp: 30 tablet, Rfl: 5 No Known Allergies  Social History   Tobacco Use  . Smoking status: Current Every Day Smoker    Packs/day: 0.25    Types: Cigarettes  . Smokeless tobacco: Never Used  Substance Use Topics  . Alcohol use: Yes    Alcohol/week: 0.0 standard drinks    Comment: occ.    Family History  Problem Relation Age of Onset  . Diabetes Mother   . Diabetes Sister   . Cancer Maternal Grandmother       Review of Systems  Constitutional: negative for fatigue and weight loss Respiratory: negative for cough and wheezing Cardiovascular: negative for chest pain, fatigue and palpitations Gastrointestinal: negative for abdominal pain and change in bowel habits Musculoskeletal:negative for myalgias Neurological: negative for gait problems and tremors Behavioral/Psych: negative for abusive relationship, depression Endocrine: negative for temperature intolerance    Genitourinary:negative for abnormal menstrual periods, genital lesions, hot flashes, sexual problems and vaginal discharge.  Positive for pelvic pain - right sided Integument/breast: negative for breast lump, breast tenderness, nipple discharge and skin lesion(s)    Objective:       BP 94/70   Pulse (!) 101   Ht 5' 4"  (1.626 m)   Wt 271 lb 11.2 oz (123.2 kg)   BMI 46.64 kg/m  General:   alert and no distress  Skin:   no rash or abnormalities  Lungs:   clear to auscultation bilaterally  Heart:   regular rate and rhythm, S1, S2 normal, no murmur, click, rub or gallop  Breasts:   normal without suspicious masses, skin or nipple changes or axillary nodes  Abdomen:  normal findings: no organomegaly, soft,  non-tender and no hernia  Pelvis:  External genitalia: normal general appearance Urinary system: urethral meatus normal and bladder without fullness, nontender Vaginal: normal without tenderness, induration or masses Cervix: normal appearance Adnexa: normal bimanual exam Uterus: anteverted and non-tender, normal size   Lab Review Urine pregnancy test Labs reviewed yes Radiologic studies reviewed yes US Pelvis Complete (Accession [id number]) (Order [id number]) Imaging Date: 07/22/2019 Department: [facility] DEPT Released By/Authorizing: Sharyn Lull  A, PA-C (auto-released)  Exam Status  Status  Final [99]  Study Result  CLINICAL DATA:  Lower abdominal and pelvic pain, history of ovarian cysts and LEFT ectopic pregnancy; LMP 07/02/2019  EXAM: TRANSABDOMINAL AND TRANSVAGINAL ULTRASOUND OF PELVIS  DOPPLER ULTRASOUND OF OVARIES  TECHNIQUE: Both transabdominal and transvaginal ultrasound examinations of the pelvis were performed. Transabdominal technique was performed for global imaging of the pelvis including uterus, ovaries, adnexal regions, and pelvic cul-de-sac.  It was necessary to proceed with endovaginal exam following the transabdominal exam to visualize the endometrium and LEFT ovary. Color and duplex Doppler ultrasound was utilized to evaluate blood flow to the ovaries.  COMPARISON:  05/16/2014  FINDINGS: Uterus  Measurements: 8.3 x 4.0 x 4.2 cm = volume: 72 mL. Anteverted. Nabothian cysts at cervix. Otherwise normal morphology without mass.  Endometrium  Thickness: 10 mm.  Normal appearance without mass or fluid  Right ovary  Measurements: 5.1 x 3.9 x 3.5 cm = volume: 45 mL. Simple cyst seen within RIGHT ovary, 4.3 x 3.3 x 3.4 cm. No complicating features.  Left ovary  Measurements: 2.0 x 2.0 x 1.2 cm = volume: 2.6 mL. Normal morphology without mass  Pulsed Doppler evaluation of both ovaries demonstrates  low resistance arterial and venous waveforms in both ovaries.  Other findings  No free pelvic fluid or additional adnexal masses.  IMPRESSION: 4.3 cm diameter simple appearing cyst within RIGHT ovary.  Otherwise normal exam.   Electronically Signed   By: Lavonia Dana M.D.   On: 07/22/2019 15:54     50% of 20 min visit spent on counseling and coordination of care.   Assessment:     1. Encounter for routine gynecological examination with Papanicolaou smear of cervix Rx: - Cytology - PAP  2. Vaginal discharge Rx: - Cervicovaginal ancillary only( [place])  3. Screening examination for STD (sexually transmitted disease) Rx: - Hepatitis B surface antigen - Hepatitis C antibody - HIV Antibody (routine testing w rflx) - RPR  4. Cyst of right ovary Rx: - US PELVIC COMPLETE WITH TRANSVAGINAL; Future  5. Primary dysmenorrhea Rx: - ibuprofen (ADVIL) 800 MG tablet; Take 1 tablet (800 mg total) by mouth every 8 (eight) hours as needed.  Dispense: 30 tablet; Refill: 5  6. General counselling and advice on contraception - not a candidate for hormonal contraception because of tobacco dependence  7. Screening breast examination Rx: - MM Digital Screening; Future  8. Class 3 severe obesity due to excess calories without serious comorbidity with body mass index (BMI) of 40.0 to 44.9 in adult [facility]) - program of caloric reduction, exercise and behavioral modification recommended    Plan:    Education reviewed: calcium supplements, depression evaluation, low fat, low cholesterol diet, safe sex/STD prevention, self breast exams, smoking cessation and weight bearing exercise. Contraception: none. Mammogram ordered. Follow up in: 3 months.   Meds ordered this encounter  Medications  . DISCONTD: Norethindrone Acetate-Ethinyl Estrad-FE (LOESTRIN 24 FE) 1-20 MG-MCG(24) tablet    Sig: Take 1 tablet by mouth daily.    Dispense:  1 Package    Refill:  11  . ibuprofen  (ADVIL) 800 MG tablet    Sig: Take 1 tablet (800 mg total) by mouth every 8 (eight) hours as needed.    Dispense:  30 tablet    Refill:  5   Orders Placed This Encounter  Procedures  . US PELVIC COMPLETE WITH TRANSVAGINAL    Standing Status:   Future    Standing Expiration  Date:   07/23/2020    Order Specific Question:   Reason for Exam (SYMPTOM  OR DIAGNOSIS REQUIRED)    Answer:   Ovarian Cyst Follow up- Right Side    Order Specific Question:   Preferred imaging location?    Answer:   [facility]  . MM Digital Screening    Standing Status:   Future    Standing Expiration Date:   07/23/2020    Order Specific Question:   Reason for Exam (SYMPTOM  OR DIAGNOSIS REQUIRED)    Answer:   Screening    Order Specific Question:   Is the patient pregnant?    Answer:   No    Order Specific Question:   Preferred imaging location?    Answer:   [facility]  . Hepatitis B surface antigen  . Hepatitis C antibody  . HIV Antibody (routine testing w rflx)  . RPR    Shelly Bombard, MD 07/24/2019 4:37 PM

## 2019-07-25 LAB — CERVICOVAGINAL ANCILLARY ONLY
Bacterial Vaginitis (gardnerella): POSITIVE — AB
Candida Glabrata: NEGATIVE
Candida Vaginitis: NEGATIVE
Chlamydia: NEGATIVE
Comment: NEGATIVE
Comment: NEGATIVE
Comment: NEGATIVE
Comment: NEGATIVE
Comment: NEGATIVE
Comment: NORMAL
Neisseria Gonorrhea: NEGATIVE
Trichomonas: NEGATIVE

## 2019-07-25 LAB — HEPATITIS C ANTIBODY: Hep C Virus Ab: 0.1 s/co ratio (ref 0.0–0.9)

## 2019-07-25 LAB — RPR: RPR Ser Ql: NONREACTIVE

## 2019-07-25 LAB — HEPATITIS B SURFACE ANTIGEN: Hepatitis B Surface Ag: NEGATIVE

## 2019-07-25 LAB — HIV ANTIBODY (ROUTINE TESTING W REFLEX): HIV Screen 4th Generation wRfx: NONREACTIVE

## 2019-07-26 ENCOUNTER — Other Ambulatory Visit: Payer: Self-pay | Admitting: Obstetrics

## 2019-07-26 DIAGNOSIS — B9689 Other specified bacterial agents as the cause of diseases classified elsewhere: Secondary | ICD-10-CM

## 2019-07-26 DIAGNOSIS — N760 Acute vaginitis: Secondary | ICD-10-CM

## 2019-07-26 MED ORDER — TINIDAZOLE 500 MG PO TABS: 1000.0000 mg | ORAL_TABLET | Freq: Every day | ORAL | 2 refills | Status: DC

## 2019-07-29 LAB — CYTOLOGY - PAP
Comment: NEGATIVE
Diagnosis: NEGATIVE
High risk HPV: NEGATIVE

## 2019-07-31 ENCOUNTER — Other Ambulatory Visit: Payer: Self-pay

## 2019-07-31 DIAGNOSIS — N760 Acute vaginitis: Secondary | ICD-10-CM

## 2019-07-31 DIAGNOSIS — B9689 Other specified bacterial agents as the cause of diseases classified elsewhere: Secondary | ICD-10-CM

## 2019-07-31 MED ORDER — TINIDAZOLE 500 MG PO TABS: 1000.0000 mg | ORAL_TABLET | Freq: Every day | ORAL | 2 refills | Status: DC

## 2019-07-31 NOTE — Progress Notes (Signed)
Rx not received per pt  Rx resent.

## 2019-08-04 ENCOUNTER — Ambulatory Visit
Admission: RE | Admit: 2019-08-04 | Discharge: 2019-08-04 | Disposition: A | Discharge status: Home / Self Care | Payer: Medicaid Other | Source: Ambulatory Visit | Attending: Obstetrics | Admitting: Obstetrics

## 2019-08-04 ENCOUNTER — Other Ambulatory Visit: Payer: Self-pay

## 2019-08-04 DIAGNOSIS — N83201 Unspecified ovarian cyst, right side: Secondary | ICD-10-CM

## 2019-08-14 ENCOUNTER — Other Ambulatory Visit: Payer: Self-pay

## 2019-08-14 ENCOUNTER — Ambulatory Visit
Admission: RE | Admit: 2019-08-14 | Discharge: 2019-08-14 | Disposition: A | Discharge status: Home / Self Care | Payer: Medicaid Other | Source: Ambulatory Visit | Attending: Obstetrics | Admitting: Obstetrics

## 2019-08-14 DIAGNOSIS — Z1239 Encounter for other screening for malignant neoplasm of breast: Secondary | ICD-10-CM

## 2019-09-08 ENCOUNTER — Inpatient Hospital Stay: Admission: RE | Admit: 2019-09-08 | Payer: Medicaid Other | Source: Ambulatory Visit

## 2020-02-10 ENCOUNTER — Other Ambulatory Visit: Payer: Self-pay | Admitting: Family Medicine

## 2020-02-10 DIAGNOSIS — M62838 Other muscle spasm: Secondary | ICD-10-CM

## 2020-02-10 MED ORDER — METHOCARBAMOL 500 MG PO TABS: 1000.0000 mg | ORAL_TABLET | Freq: Three times a day (TID) | ORAL | 0 refills | Status: DC | PRN

## 2020-02-10 NOTE — Telephone Encounter (Signed)
Medication Refill - Medication: Methocarbamol  Has the patient contacted their pharmacy? Yes.   (Agent: If no, request that the patient contact the pharmacy for the refill.) (Agent: If yes, when and what did the pharmacy advise?)  Preferred Pharmacy (with phone number or street name): [facility], [place]  Agent: Please be advised that RX refills may take up to 3 business days. We ask that you follow-up with your pharmacy.

## 2020-02-10 NOTE — Telephone Encounter (Signed)
Requested medication (s) are due for refill today - unsure  Requested medication (s) are on the active medication list -yes  Future visit scheduled -yes  Last refill: 04/02/19 #90 1 RF  Notes to clinic: Request non delegated Rx  Requested Prescriptions  Pending Prescriptions Disp Refills   methocarbamol (ROBAXIN) 500 MG tablet 90 tablet 1    Sig: Take 2 tablets (1,000 mg total) by mouth every 8 (eight) hours as needed for muscle spasms.      Not Delegated - Analgesics:  Muscle Relaxants Failed - 02/10/2020  2:57 PM      Failed - This refill cannot be delegated      Failed - Valid encounter within last 6 months    Recent Outpatient Visits           9 months ago Acute right ankle pain   [place] [place], [place], MD   10 months ago Kidney pain   [facility] [place], [place], [place]   1 year ago Right flank pain   [place], Enobong, MD   1 year ago Gastroesophageal reflux disease, unspecified whether esophagitis present   [facility] [place], [place], [place]   1 year ago Recurrent headache   [place], MD       Future Appointments             In 1 month Charlott Rakes, MD [place]                 Requested Prescriptions  Pending Prescriptions Disp Refills   methocarbamol (ROBAXIN) 500 MG tablet 90 tablet 1    Sig: Take 2 tablets (1,000 mg total) by mouth every 8 (eight) hours as needed for muscle spasms.      Not Delegated - Analgesics:  Muscle Relaxants Failed - 02/10/2020  2:57 PM      Failed - This refill cannot be delegated      Failed - Valid encounter within last 6 months    Recent Outpatient Visits           9 months ago Acute right ankle pain   [place], Enobong, MD   10 months ago Kidney pain   [facility] [place], [place], [place]   1 year ago Right flank pain   [place], Enobong, MD   1 year ago Gastroesophageal reflux disease, unspecified whether esophagitis present   [place], [place]   1 year ago Recurrent headache   [place], MD       Future Appointments             In 1 month Charlott Rakes, MD [place]

## 2020-03-31 ENCOUNTER — Other Ambulatory Visit: Payer: Self-pay

## 2020-03-31 ENCOUNTER — Encounter: Payer: Self-pay | Admitting: Family Medicine

## 2020-03-31 ENCOUNTER — Ambulatory Visit: Payer: Medicaid Other | Attending: Family Medicine | Admitting: Family Medicine

## 2020-03-31 VITALS — BP 100/69 | HR 91 | Ht 64.0 in | Wt 279.2 lb

## 2020-03-31 DIAGNOSIS — I10 Essential (primary) hypertension: Secondary | ICD-10-CM | POA: Insufficient documentation

## 2020-03-31 DIAGNOSIS — K219 Gastro-esophageal reflux disease without esophagitis: Secondary | ICD-10-CM | POA: Diagnosis not present

## 2020-03-31 DIAGNOSIS — Z79899 Other long term (current) drug therapy: Secondary | ICD-10-CM | POA: Insufficient documentation

## 2020-03-31 DIAGNOSIS — R0789 Other chest pain: Secondary | ICD-10-CM

## 2020-03-31 DIAGNOSIS — M62838 Other muscle spasm: Secondary | ICD-10-CM | POA: Insufficient documentation

## 2020-03-31 DIAGNOSIS — F321 Major depressive disorder, single episode, moderate: Secondary | ICD-10-CM | POA: Diagnosis not present

## 2020-03-31 DIAGNOSIS — F3132 Bipolar disorder, current episode depressed, moderate: Secondary | ICD-10-CM

## 2020-03-31 DIAGNOSIS — G44229 Chronic tension-type headache, not intractable: Secondary | ICD-10-CM | POA: Diagnosis not present

## 2020-03-31 DIAGNOSIS — R600 Localized edema: Secondary | ICD-10-CM

## 2020-03-31 DIAGNOSIS — Z6841 Body Mass Index (BMI) 40.0 and over, adult: Secondary | ICD-10-CM | POA: Insufficient documentation

## 2020-03-31 DIAGNOSIS — F419 Anxiety disorder, unspecified: Secondary | ICD-10-CM

## 2020-03-31 MED ORDER — TOPIRAMATE 50 MG PO TABS: 50.0000 mg | ORAL_TABLET | Freq: Two times a day (BID) | ORAL | 6 refills | Status: DC

## 2020-03-31 MED ORDER — HYDROCHLOROTHIAZIDE 25 MG PO TABS: 25.0000 mg | ORAL_TABLET | Freq: Every day | ORAL | 1 refills | Status: DC

## 2020-03-31 MED ORDER — OMEPRAZOLE 20 MG PO CPDR: 20.0000 mg | DELAYED_RELEASE_CAPSULE | Freq: Every day | ORAL | 6 refills | Status: DC

## 2020-03-31 MED ORDER — CYCLOBENZAPRINE HCL 10 MG PO TABS: 10.0000 mg | ORAL_TABLET | Freq: Two times a day (BID) | ORAL | 3 refills | Status: DC | PRN

## 2020-03-31 MED ORDER — HYDROXYZINE PAMOATE 50 MG PO CAPS: 50.0000 mg | ORAL_CAPSULE | Freq: Three times a day (TID) | ORAL | 0 refills | Status: DC | PRN

## 2020-03-31 NOTE — Patient Instructions (Signed)
Muscle Cramps and Spasms Muscle cramps and spasms are when muscles tighten by themselves. They usually get better within minutes. Muscle cramps are painful. They are usually stronger and last longer than muscle spasms. Muscle spasms may or may not be painful. They can last a few seconds or much longer. Cramps and spasms can affect any muscle, but they occur most often in the calf muscles of the leg. They are usually not caused by a serious problem. In many cases, the cause is not known. Some common causes include:  Doing more physical work or exercise than your body is ready for.  Using the muscles too much (overuse) by repeating certain movements too many times.  Staying in a certain position for a long time.  Playing a sport or doing an activity without preparing properly.  Using bad form or technique while playing a sport or doing an activity.  Not having enough water in your body (dehydration).  Injury.  Side effects of some medicines.  Low levels of the salts and minerals in your blood (electrolytes), such as low potassium or calcium. Follow these instructions at home: Managing pain and stiffness  Massage, stretch, and relax the muscle. Do this for many minutes at a time.  If told, put heat on tight or tense muscles as often as told by your doctor. Use the heat source that your doctor recommends, such as a moist heat pack or a heating pad. ? Place a towel between your skin and the heat source. ? Leave the heat on for 20-30 minutes. ? Remove the heat if your skin turns bright red. This is very important if you are not able to feel pain, heat, or cold. You may have a greater risk of getting burned.  If told, put ice on the affected area. This may help if you are sore or have pain after a cramp or spasm. ? Put ice in a plastic bag. ? Place a towel between your skin and the bag. ? Leave the ice on for 20 minutes, 2-3 times a day.  Try taking hot showers or baths to help relax  tight muscles.      Eating and drinking  Drink enough fluid to keep your pee (urine) pale yellow.  Eat a healthy diet to help ensure that your muscles work well. This should include: ? Fruits and vegetables. ? Lean protein. ? Whole grains. ? Low-fat or nonfat dairy products. General instructions  If you are having cramps often, avoid intense exercise for several days.  Take over-the-counter and prescription medicines only as told by your doctor.  Watch for any changes in your symptoms.  Keep all follow-up visits as told by your doctor. This is important. Contact a doctor if:  Your cramps or spasms get worse or happen more often.  Your cramps or spasms do not get better with time. Summary  Muscle cramps and spasms are when muscles tighten by themselves. They usually get better within minutes.  Cramps and spasms occur most often in the calf muscles of the leg.  Massage, stretch, and relax the muscle. This may help the cramp or spasm go away.  Drink enough fluid to keep your pee (urine) pale yellow. This information is not intended to replace advice given to you by your health care provider. Make sure you discuss any questions you have with your health care provider. Document Revised: 07/09/2017 Document Reviewed: 07/09/2017 Elsevier Patient Education  [place].

## 2020-03-31 NOTE — Progress Notes (Signed)
Has anxiety states she wakes up in panic attacks.  States that her entire body cramps up.  Has some swelling and pain in her ankles.  Lower left side back pain. Has pains in her chest.

## 2020-03-31 NOTE — Progress Notes (Signed)
Subjective:  Patient ID: Kathryn Larson, female    DOB: [birth year]  Age: 50 y.o. MRN: [id number]  CC: Spasms   HPI Kathryn Larson is a 50 year old morbidly obese female with a medical history of hypertension, bipolar disorder, GERD who presents today for follow up visit.  Complains of anxiety.  She was followed by Psychiatry-Dr.Headen whom she said left the practice.  She currently has her psychotropic medications and has been taking them.  Her entire body has been cramping up and she feels cramps move from one part of her body to the other ever since she fell down the stairs a couple of months back and Robaxin has not provided relief.  She tried her mother's Flexeril which was helpful.. Her daughter is her Physiological scientist and she assist with her ADL.  Complains of swelling in ankles which has been present for a while and she denies increased salt intake Complains of chest pains in her R chest wall radiating to L inframammary region and she has to hold her chest. It hurts more when her anxiety and panic attack are on.  Past Medical History:  Diagnosis Date  . Anxiety   . Bipolar affective ([place])   . Bipolar disorder, unspecified ([place]) 03/26/2013  . Calculus of gallbladder with acute cholecystitis, without mention of obstruction 03/26/2013  . Carpal tunnel syndrome   . GERD (gastroesophageal reflux disease) 1994  . GERD (gastroesophageal reflux disease) 03/26/2013  . Obesity   . Obesity, Class III, BMI 40-49.9 (morbid obesity) ([place]) 03/26/2013  . Ovarian cyst   . Panic attacks   . Rectal bleed    Hemorrhoids  . Seizures ([place])    last seizure in May 2015    Past Surgical History:  Procedure Laterality Date  . ANKLE FRACTURE SURGERY     Bilateral - right 2013, left 2010  . CHOLECYSTECTOMY N/A 03/24/2013   Procedure: LAPAROSCOPIC CHOLECYSTECTOMY;  Surgeon: [name], MD;  Location: [facility] OR;  Service: General;  Laterality: N/A;  . ECTOPIC PREGNANCY SURGERY      Family History   Problem Relation Age of Onset  . Diabetes Mother   . Diabetes Sister   . Cancer Maternal Grandmother     No Known Allergies  Outpatient Medications Prior to Visit  Medication Sig Dispense Refill  . acetaminophen-codeine (TYLENOL #3) 300-30 MG tablet Take 1 tablet by mouth every 12 (twelve) hours as needed for moderate pain. 60 tablet 0  . cetirizine (ZYRTEC) 10 MG tablet Take 1 tablet (10 mg total) by mouth daily. 30 tablet 1  . divalproex (DEPAKOTE ER) 500 MG 24 hr tablet Take 500 mg by mouth 2 (two) times daily.    Marland Kitchen escitalopram (LEXAPRO) 20 MG tablet Take 20 mg by mouth daily.   0  . ibuprofen (ADVIL) 800 MG tablet Take 1 tablet (800 mg total) by mouth every 8 (eight) hours as needed. 30 tablet 5  . lisinopril (ZESTRIL) 5 MG tablet TAKE 1 TABLET BY MOUTH DAILY (Patient taking differently: Take 5 mg by mouth daily.) 30 tablet 6  . naproxen (NAPROSYN) 500 MG tablet Take 1 tablet (500 mg total) by mouth 2 (two) times daily with a meal. Prn pain 60 tablet 1  . prazosin (MINIPRESS) 5 MG capsule Take 5 mg by mouth daily.   0  . sertraline (ZOLOFT) 100 MG tablet Take 100 mg by mouth 3 (three) times daily.    Marland Kitchen tinidazole (TINDAMAX) 500 MG tablet Take 2 tablets (1,000 mg total) by mouth  daily with breakfast. 10 tablet 2  . hydrOXYzine (VISTARIL) 50 MG capsule Take 100 mg by mouth every 8 (eight) hours as needed for anxiety or itching.     . methocarbamol (ROBAXIN) 500 MG tablet Take 2 tablets (1,000 mg total) by mouth every 8 (eight) hours as needed for muscle spasms. 90 tablet 0  . omeprazole (PRILOSEC) 20 MG capsule Take 1 capsule (20 mg total) by mouth daily. 30 capsule 6  . topiramate (TOPAMAX) 50 MG tablet Take 1 tablet (50 mg total) by mouth 2 (two) times daily. 60 tablet 3  . ALPRAZolam (XANAX) 1 MG tablet Take 1 mg by mouth 3 (three) times daily. (Patient not taking: Reported on 03/31/2020)    . ziprasidone (GEODON) 80 MG capsule Take 80 mg by mouth 2 (two) times daily. (Patient not  taking: Reported on 03/31/2020)     No facility-administered medications prior to visit.     ROS Review of Systems  Constitutional: Negative for activity change, appetite change and fatigue.  HENT: Negative for congestion, sinus pressure and sore throat.   Eyes: Negative for visual disturbance.  Respiratory: Negative for cough, chest tightness, shortness of breath and wheezing.   Cardiovascular: Negative for chest pain and palpitations.  Gastrointestinal: Negative for abdominal distention, abdominal pain and constipation.  Endocrine: Negative for polydipsia.  Genitourinary: Negative for dysuria and frequency.  Musculoskeletal: Positive for myalgias.  Skin: Negative for rash.  Neurological: Negative for tremors, light-headedness and numbness.  Hematological: Does not bruise/bleed easily.  Psychiatric/Behavioral: Negative for agitation and behavioral problems.    Objective:  BP 100/69   Pulse 91   Ht 5' 4"  (1.626 m)   Wt 279 lb 3.2 oz (126.6 kg)   SpO2 98%   BMI 47.92 kg/m   BP/Weight 03/31/2020 07/24/2019 8/67/6195  Systolic BP 093 94 267  Diastolic BP 69 70 72  Wt. (Lbs) 279.2 271.7 -  BMI 47.92 46.64 -      Physical Exam Constitutional:      Appearance: She is well-developed.  Neck:     Vascular: No JVD.  Cardiovascular:     Rate and Rhythm: Normal rate.     Heart sounds: Normal heart sounds. No murmur heard.   Pulmonary:     Effort: Pulmonary effort is normal.     Breath sounds: Normal breath sounds. No wheezing or rales.  Chest:     Chest wall: No tenderness.  Abdominal:     General: Bowel sounds are normal. There is no distension.     Palpations: Abdomen is soft. There is no mass.     Tenderness: There is no abdominal tenderness.  Musculoskeletal:        General: Normal range of motion.     Right lower leg: No edema.     Left lower leg: No edema.  Neurological:     Mental Status: She is alert and oriented to person, place, and time.  Psychiatric:         Mood and Affect: Mood normal.     CMP Latest Ref Rng & Units 07/22/2019 04/02/2019 11/28/2018  Glucose 70 - 99 mg/dL 95 79 82  BUN 6 - 20 mg/dL 6 11 9   Creatinine 0.44 - 1.00 mg/dL 0.88 0.98 1.01(H)  Sodium 135 - 145 mmol/L 139 140 137  Potassium 3.5 - 5.1 mmol/L 4.3 4.4 4.3  Chloride 98 - 111 mmol/L 105 107(H) 106  CO2 22 - 32 mmol/L 26 20 17(L)  Calcium 8.9 - 10.3 mg/dL 8.9  9.7 9.9  Total Protein 6.0 - 8.5 g/dL - - 7.8  Total Bilirubin 0.0 - 1.2 mg/dL - - 0.3  Alkaline Phos 39 - 117 IU/L - - 85  AST 0 - 40 IU/L - - 39  ALT 0 - 32 IU/L - - 41(H)    Lipid Panel     Component Value Date/Time   CHOL 191 11/19/2015 1116   TRIG 95 11/19/2015 1116   HDL 40 (L) 11/19/2015 1116   CHOLHDL 4.8 11/19/2015 1116   VLDL 19 11/19/2015 1116   LDLCALC 132 (H) 11/19/2015 1116    CBC    Component Value Date/Time   WBC 8.9 07/22/2019 1345   RBC 4.44 07/22/2019 1345   HGB 13.9 07/22/2019 1345   HGB 15.0 11/28/2018 1426   HCT 41.9 07/22/2019 1345   HCT 44.6 11/28/2018 1426   PLT 362 07/22/2019 1345   PLT 342 11/28/2018 1426   MCV 94.4 07/22/2019 1345   MCV 93 11/28/2018 1426   MCH 31.3 07/22/2019 1345   MCHC 33.2 07/22/2019 1345   RDW 14.2 07/22/2019 1345   RDW 12.8 11/28/2018 1426   LYMPHSABS 2.8 11/28/2018 1426   MONOABS 0.8 05/16/2014 1730   EOSABS 0.2 11/28/2018 1426   BASOSABS 0.0 11/28/2018 1426    No results found for: HGBA1C  Assessment & Plan:  1. GERD without esophagitis Controlled - omeprazole (PRILOSEC) 20 MG capsule; Take 1 capsule (20 mg total) by mouth daily.  Dispense: 30 capsule; Refill: 6  2. Chronic tension-type headache, not intractable Stable - topiramate (TOPAMAX) 50 MG tablet; Take 1 tablet (50 mg total) by mouth 2 (two) times daily.  Dispense: 60 tablet; Refill: 6  3. Bipolar affective disorder, currently depressed, moderate ([place]) With worsening anxiety Her psychiatrist recently left and she would not like to see the other psychiatrist at the  practice She has her psychotropic medications however I have refilled hydroxyzine to help with anxiety I have placed a referral for her - Ambulatory referral to Psychiatry  4. Muscle spasm Uncontrolled on Robaxin We will switch to cyclobenzaprine which she said she did better on Frequent muscle spasms could be weight related and she has been encouraged to lose weight - cyclobenzaprine (FLEXERIL) 10 MG tablet; Take 1 tablet (10 mg total) by mouth 2 (two) times daily as needed for muscle spasms.  Dispense: 60 tablet; Refill: 3  5. Pedal edema This is not evident on my exam We will switch from lisinopril to hydrochlorothiazide hopefully the diuretic effect of the latter will be beneficial Advised to elevate feet, use compression stockings, avoid high sodium foods - Basic Metabolic Panel; Future  6. Essential hypertension Controlled Due to complaints of pedal edema I have substituted lisinopril with hydrochlorothiazide She will have a repeat BMP in 2 weeks to check her potassium levels - hydrochlorothiazide (HYDRODIURIL) 25 MG tablet; Take 1 tablet (25 mg total) by mouth daily.  Dispense: 90 tablet; Refill: 1  7. Anxiety Uncontrolled Refill hydroxyzine and referred to psych - hydrOXYzine (VISTARIL) 50 MG capsule; Take 1 capsule (50 mg total) by mouth every 8 (eight) hours as needed for anxiety or itching.  Dispense: 90 capsule; Refill: 0  8. Other chest pain Will need to exclude cardiac abnormality EKG reveals sinus arrhythmia Chest pain likely from anxiety Patient reassured    Meds ordered this encounter  Medications  . cyclobenzaprine (FLEXERIL) 10 MG tablet    Sig: Take 1 tablet (10 mg total) by mouth 2 (two) times daily as needed  for muscle spasms.    Dispense:  60 tablet    Refill:  3  . hydrochlorothiazide (HYDRODIURIL) 25 MG tablet    Sig: Take 1 tablet (25 mg total) by mouth daily.    Dispense:  90 tablet    Refill:  1    Discontinue lisinopril  . hydrOXYzine  (VISTARIL) 50 MG capsule    Sig: Take 1 capsule (50 mg total) by mouth every 8 (eight) hours as needed for anxiety or itching.    Dispense:  90 capsule    Refill:  0  . omeprazole (PRILOSEC) 20 MG capsule    Sig: Take 1 capsule (20 mg total) by mouth daily.    Dispense:  30 capsule    Refill:  6  . topiramate (TOPAMAX) 50 MG tablet    Sig: Take 1 tablet (50 mg total) by mouth 2 (two) times daily.    Dispense:  60 tablet    Refill:  6    Follow-up: Return in about 6 months (around 09/28/2020) for Chronic disease management.       Charlott Rakes, MD, FAAFP. [facility], [place]   03/31/2020, 2:46 PM

## 2020-04-02 ENCOUNTER — Telehealth: Payer: Self-pay | Admitting: Family Medicine

## 2020-04-02 NOTE — Telephone Encounter (Signed)
Copied from [place] [id number]. Topic: General - Call Back - No Documentation >> Apr 02, 2020  1:36 PM Erick Blinks wrote: Reason for CRM: Pt called to report that her insurance did not cover her Rx at the pharmacy for Cyclobenzaprine. Wants call back from the clinic  Best contact: [phone]

## 2020-04-05 ENCOUNTER — Other Ambulatory Visit: Payer: Self-pay

## 2020-04-05 NOTE — Telephone Encounter (Signed)
Pt called and was informed of medications being sent to pharmacy.

## 2020-04-14 ENCOUNTER — Other Ambulatory Visit: Payer: Self-pay

## 2020-04-14 ENCOUNTER — Ambulatory Visit: Payer: Medicaid Other | Attending: Family Medicine

## 2020-04-14 DIAGNOSIS — R600 Localized edema: Secondary | ICD-10-CM

## 2020-04-15 ENCOUNTER — Telehealth: Payer: Self-pay | Admitting: Family Medicine

## 2020-04-15 ENCOUNTER — Telehealth: Payer: Self-pay

## 2020-04-15 LAB — BASIC METABOLIC PANEL
BUN/Creatinine Ratio: 9 (ref 9–23)
BUN: 10 mg/dL (ref 6–24)
CO2: 19 mmol/L — ABNORMAL LOW (ref 20–29)
Calcium: 10 mg/dL (ref 8.7–10.2)
Chloride: 101 mmol/L (ref 96–106)
Creatinine, Ser: 1.09 mg/dL — ABNORMAL HIGH (ref 0.57–1.00)
GFR calc Af Amer: 70 mL/min/{1.73_m2} (ref >59)
GFR calc non Af Amer: 61 mL/min/{1.73_m2} (ref >59)
Glucose: 87 mg/dL (ref 65–99)
Potassium: 4.1 mmol/L (ref 3.5–5.2)
Sodium: 137 mmol/L (ref 134–144)

## 2020-04-15 NOTE — Telephone Encounter (Signed)
Pt called saying she was in the office yesterday and her BP was a little high,.  She forgot to ask for a refill of her BP medication.  She does not know the name of the medication she is taking  [place] [facility] [place]

## 2020-04-15 NOTE — Telephone Encounter (Signed)
Pt was called and informed of medication being sent to pharmacy on 03/31/20

## 2020-04-15 NOTE — Telephone Encounter (Signed)
-----   Message from Charlott Rakes, MD sent at 04/15/2020 11:16 AM EST ----- Please inform the patient that labs are normal. Thank you.

## 2020-04-15 NOTE — Telephone Encounter (Signed)
Patient name and DOB has been verified Patient was informed of lab results. Patient had no questions.  

## 2020-07-21 ENCOUNTER — Emergency Department (HOSPITAL_COMMUNITY): Payer: Medicaid Other

## 2020-07-21 ENCOUNTER — Other Ambulatory Visit: Payer: Self-pay

## 2020-07-21 ENCOUNTER — Emergency Department (HOSPITAL_COMMUNITY)
Admission: EM | Admit: 2020-07-21 | Discharge: 2020-07-21 | Disposition: A | Discharge status: Home / Self Care | Payer: Medicaid Other | Attending: Emergency Medicine | Admitting: Emergency Medicine

## 2020-07-21 ENCOUNTER — Encounter (HOSPITAL_COMMUNITY): Payer: Self-pay

## 2020-07-21 DIAGNOSIS — Z79899 Other long term (current) drug therapy: Secondary | ICD-10-CM | POA: Insufficient documentation

## 2020-07-21 DIAGNOSIS — F17210 Nicotine dependence, cigarettes, uncomplicated: Secondary | ICD-10-CM | POA: Insufficient documentation

## 2020-07-21 DIAGNOSIS — I10 Essential (primary) hypertension: Secondary | ICD-10-CM | POA: Diagnosis not present

## 2020-07-21 DIAGNOSIS — R072 Precordial pain: Secondary | ICD-10-CM | POA: Diagnosis present

## 2020-07-21 DIAGNOSIS — R079 Chest pain, unspecified: Secondary | ICD-10-CM

## 2020-07-21 LAB — CBC
HCT: 42.6 % (ref 36.0–46.0)
Hemoglobin: 14.2 g/dL (ref 12.0–15.0)
MCH: 31.3 pg (ref 26.0–34.0)
MCHC: 33.3 g/dL (ref 30.0–36.0)
MCV: 94 fL (ref 80.0–100.0)
Platelets: 318 10*3/uL (ref 150–400)
RBC: 4.53 MIL/uL (ref 3.87–5.11)
RDW: 14.6 % (ref 11.5–15.5)
WBC: 11.6 10*3/uL — ABNORMAL HIGH (ref 4.0–10.5)
nRBC: 0 % (ref 0.0–0.2)

## 2020-07-21 LAB — I-STAT BETA HCG BLOOD, ED (MC, WL, AP ONLY): I-stat hCG, quantitative: <5 m[IU]/mL (ref <5)

## 2020-07-21 LAB — BASIC METABOLIC PANEL
Anion gap: 10 (ref 5–15)
BUN: 7 mg/dL (ref 6–20)
CO2: 22 mmol/L (ref 22–32)
Calcium: 9.3 mg/dL (ref 8.9–10.3)
Chloride: 105 mmol/L (ref 98–111)
Creatinine, Ser: 1.01 mg/dL — ABNORMAL HIGH (ref 0.44–1.00)
GFR, Estimated: >60 mL/min (ref >60)
Glucose, Bld: 103 mg/dL — ABNORMAL HIGH (ref 70–99)
Potassium: 4 mmol/L (ref 3.5–5.1)
Sodium: 137 mmol/L (ref 135–145)

## 2020-07-21 LAB — TROPONIN I (HIGH SENSITIVITY): Troponin I (High Sensitivity): 2 ng/L (ref <18)

## 2020-07-21 MED ORDER — LIDOCAINE VISCOUS HCL 2 % MT SOLN
15.0000 mL | Freq: Once | OROMUCOSAL | Status: AC
  Administered 2020-07-21: 15 mL via ORAL
  Filled 2020-07-21: qty 15

## 2020-07-21 MED ORDER — FAMOTIDINE 20 MG PO TABS
20.0000 mg | ORAL_TABLET | Freq: Two times a day (BID) | ORAL | 0 refills | Status: AC
Start: 2020-07-21 — End: ?

## 2020-07-21 MED ORDER — ALUM & MAG HYDROXIDE-SIMETH 200-200-20 MG/5ML PO SUSP
30.0000 mL | Freq: Once | ORAL | Status: AC
  Administered 2020-07-21: 30 mL via ORAL
  Filled 2020-07-21: qty 30

## 2020-07-21 NOTE — ED Triage Notes (Signed)
Pt reports chest pain and SHOB since Friday that has progressively gotten worse. Denies abdominal pain and N/V/D.

## 2020-07-21 NOTE — Discharge Instructions (Addendum)
Return for any problem.   Continue to use Prilosec as previously prescribed.   Start using Pepcid as prescribed.

## 2020-07-21 NOTE — ED Provider Notes (Signed)
[facility] DEPT Provider Note   CSN: [id number] Arrival date & time: 07/21/20  1555     History Chief Complaint  Patient presents with  . Chest Pain    Kathryn Larson is a 51 y.o. female.  50 year old female with prior medical history as detailed below presents for evaluation.  Patient complains of midsternal chest discomfort.  This began 4 days prior.  She reports constant discomfort.  She reports prior history of GERD.  She reports the pain today is worse than her normal GERD symptoms.  She denies associated nausea, vomiting, diaphoresis, shortness of breath, or other complaint.  She denies prior history of CAD.  The history is provided by the patient and medical records.  Chest Pain Pain location:  Substernal area Pain quality: aching and burning   Pain radiates to:  Does not radiate Pain severity:  Mild Onset quality:  Gradual Duration:  5 days Timing:  Constant Progression:  Unchanged Chronicity:  New Relieved by:  Nothing Worsened by:  Nothing      Past Medical History:  Diagnosis Date  . Anxiety   . Bipolar affective ([place])   . Bipolar disorder, unspecified ([place]) 03/26/2013  . Calculus of gallbladder with acute cholecystitis, without mention of obstruction 03/26/2013  . Carpal tunnel syndrome   . GERD (gastroesophageal reflux disease) 1994  . GERD (gastroesophageal reflux disease) 03/26/2013  . Obesity   . Obesity, Class III, BMI 40-49.9 (morbid obesity) ([place]) 03/26/2013  . Ovarian cyst   . Panic attacks   . Rectal bleed    Hemorrhoids  . Seizures ([place])    last seizure in May 2015    Patient Active Problem List   Diagnosis Date Noted  . Seizures ([place]) 09/28/2016  . Chronic low back pain without sciatica 09/28/2016  . Headache 09/28/2016  . Essential hypertension 11/19/2015  . Ganglion cyst 11/19/2015  . Frequent falls 11/19/2015  . Pain in joint, ankle and foot 11/19/2015  . Calculus of gallbladder with acute  cholecystitis, without mention of obstruction 03/26/2013  . GERD (gastroesophageal reflux disease) 03/26/2013  . Bipolar disorder, unspecified ([place]) 03/26/2013  . Obesity, Class III, BMI 40-49.9 (morbid obesity) ([place]) 03/26/2013    Past Surgical History:  Procedure Laterality Date  . ANKLE FRACTURE SURGERY     Bilateral - right 2013, left 2010  . CHOLECYSTECTOMY N/A 03/24/2013   Procedure: LAPAROSCOPIC CHOLECYSTECTOMY;  Surgeon: [name], MD;  Location: [facility] OR;  Service: General;  Laterality: N/A;  . ECTOPIC PREGNANCY SURGERY       OB History    Gravida  6   Para  2   Term  2   Preterm      AB  4   Living  2     SAB  4   IAB      Ectopic      Multiple      Live Births  2           Family History  Problem Relation Age of Onset  . Diabetes Mother   . Diabetes Sister   . Cancer Maternal Grandmother     Social History   Tobacco Use  . Smoking status: Current Every Day Smoker    Packs/day: 0.25    Types: Cigarettes  . Smokeless tobacco: Never Used  Vaping Use  . Vaping Use: Never used  Substance Use Topics  . Alcohol use: Yes    Alcohol/week: 0.0 standard drinks    Comment: occ.  Marland Kitchen  Drug use: No    Home Medications Prior to Admission medications   Medication Sig Start Date End Date Taking? Authorizing Provider  acetaminophen-codeine (TYLENOL #3) 300-30 MG tablet Take 1 tablet by mouth every 12 (twelve) hours as needed for moderate pain. 04/28/19   Charlott Rakes, MD  ALPRAZolam Duanne Moron) 1 MG tablet Take 1 mg by mouth 3 (three) times daily. Patient not taking: Reported on 03/31/2020    [provider]  cetirizine (ZYRTEC) 10 MG tablet Take 1 tablet (10 mg total) by mouth daily. 12/18/17   Charlott Rakes, MD  cyclobenzaprine (FLEXERIL) 10 MG tablet Take 1 tablet (10 mg total) by mouth 2 (two) times daily as needed for muscle spasms. 03/31/20   Charlott Rakes, MD  divalproex (DEPAKOTE ER) 500 MG 24 hr tablet Take 500 mg by mouth 2 (two) times  daily. 04/09/19   [provider]  escitalopram (LEXAPRO) 20 MG tablet Take 20 mg by mouth daily.  07/19/16   [provider]  hydrochlorothiazide (HYDRODIURIL) 25 MG tablet Take 1 tablet (25 mg total) by mouth daily. 03/31/20   Charlott Rakes, MD  hydrOXYzine (VISTARIL) 50 MG capsule Take 1 capsule (50 mg total) by mouth every 8 (eight) hours as needed for anxiety or itching. 03/31/20   Charlott Rakes, MD  ibuprofen (ADVIL) 800 MG tablet Take 1 tablet (800 mg total) by mouth every 8 (eight) hours as needed. 07/24/19   Shelly Bombard, MD  lisinopril (ZESTRIL) 5 MG tablet TAKE 1 TABLET BY MOUTH DAILY Patient taking differently: Take 5 mg by mouth daily. 12/31/18   Charlott Rakes, MD  naproxen (NAPROSYN) 500 MG tablet Take 1 tablet (500 mg total) by mouth 2 (two) times daily with a meal. Prn pain 04/02/19   Argentina Donovan, PA-C  omeprazole (PRILOSEC) 20 MG capsule Take 1 capsule (20 mg total) by mouth daily. 03/31/20   Charlott Rakes, MD  prazosin (MINIPRESS) 5 MG capsule Take 5 mg by mouth daily.  07/19/16   [provider]  sertraline (ZOLOFT) 100 MG tablet Take 100 mg by mouth 3 (three) times daily.    [provider]  tinidazole (TINDAMAX) 500 MG tablet Take 2 tablets (1,000 mg total) by mouth daily with breakfast. 07/31/19   Shelly Bombard, MD  topiramate (TOPAMAX) 50 MG tablet Take 1 tablet (50 mg total) by mouth 2 (two) times daily. 03/31/20   Charlott Rakes, MD  ziprasidone (GEODON) 80 MG capsule Take 80 mg by mouth 2 (two) times daily. Patient not taking: Reported on 03/31/2020 04/15/19   [provider]    Allergies    Patient has no known allergies.  Review of Systems   Review of Systems  Cardiovascular: Positive for chest pain.  All other systems reviewed and are negative.   Physical Exam Updated Vital Signs BP 127/88 (BP Location: Left Arm)   Pulse 91   Temp 98.7 F (37.1 C) (Oral)   Resp 18   LMP  (Approximate)   SpO2 99%    Physical Exam Vitals and nursing note reviewed.  Constitutional:      General: She is not in acute distress.    Appearance: She is well-developed.  HENT:     Head: Normocephalic and atraumatic.  Eyes:     Conjunctiva/sclera: Conjunctivae normal.     Pupils: Pupils are equal, round, and reactive to light.  Cardiovascular:     Rate and Rhythm: Normal rate and regular rhythm.     Heart sounds: Normal heart  sounds.  Pulmonary:     Effort: Pulmonary effort is normal. No respiratory distress.     Breath sounds: Normal breath sounds.  Abdominal:     General: There is no distension.     Palpations: Abdomen is soft.     Tenderness: There is no abdominal tenderness.  Musculoskeletal:        General: No deformity. Normal range of motion.     Cervical back: Normal range of motion and neck supple.  Skin:    General: Skin is warm and dry.  Neurological:     Mental Status: She is alert and oriented to person, place, and time.     ED Results / Procedures / Treatments   Labs (all labs ordered are listed, but only abnormal results are displayed) Labs Reviewed  BASIC METABOLIC PANEL - Abnormal; Notable for the following components:      Result Value   Glucose, Bld 103 (*)    Creatinine, Ser 1.01 (*)    All other components within normal limits  CBC - Abnormal; Notable for the following components:   WBC 11.6 (*)    All other components within normal limits  I-STAT BETA HCG BLOOD, ED ([facility], [facility], [facility] ONLY)  TROPONIN I (HIGH SENSITIVITY)    EKG EKG Interpretation  Date/Time:  Wednesday Jul 21 2020 16:10:09 EDT Ventricular Rate:  99 PR Interval:  151 QRS Duration: 86 QT Interval:  342 QTC Calculation: 439 R Axis:   66 Text Interpretation: Sinus rhythm Probable left atrial enlargement Borderline T wave abnormalities 12 Lead; [place] Confirmed by Dene Gentry [phone]) on 07/21/2020 4:17:19 PM   Radiology DG Chest 2 View  Result Date: 07/21/2020 CLINICAL DATA:  Chest pain and  shortness of breath, progressive. EXAM: CHEST - 2 VIEW COMPARISON:  07/22/2019 FINDINGS: The cardiomediastinal contours are normal. Subsegmental atelectasis in both lung bases. Pulmonary vasculature is normal. No consolidation, pleural effusion, or pneumothorax. No acute osseous abnormalities are seen. IMPRESSION: Subsegmental bibasilar atelectasis. Electronically Signed   By: Keith Rake M.D.   On: 07/21/2020 17:22    Procedures Procedures   Medications Ordered in ED Medications  alum & mag hydroxide-simeth (MAALOX/MYLANTA) 200-200-20 MG/5ML suspension 30 mL (30 mLs Oral Given 07/21/20 1644)    And  lidocaine (XYLOCAINE) 2 % viscous mouth solution 15 mL (15 mLs Oral Given 07/21/20 1644)    ED Course  I have reviewed the triage vital signs and the nursing notes.  Pertinent labs & imaging results that were available during my care of the patient were reviewed by me and considered in my medical decision making (see chart for details).    MDM Rules/Calculators/A&P                          MDM  MSE complete  Kathryn Larson was evaluated in Emergency Department on 07/21/2020 for the symptoms described in the history of present illness. She was evaluated in the context of the global COVID-19 pandemic, which necessitated consideration that the patient might be at risk for infection with the SARS-CoV-2 virus that causes COVID-19. Institutional protocols and algorithms that pertain to the evaluation of patients at risk for COVID-19 are in a state of rapid change based on information released by regulatory bodies including the CDC and federal and state organizations. These policies and algorithms were followed during the patient's care in the ED.  Patient is presenting with complaint of 4 to 5 days of persistent constant substernal chest discomfort.  Described symptoms or not consistent with likely ACS.  EKG is not demonstrative of acute ischemia.  Troponin is 2.  Obtained screening labs are  without significant abnormality.  Patient's described symptoms are probably most consistent with GERD.  Patient does feel significantly improved with GI cocktail.  Patient does understand need for close follow-up.  Strict return precautions given and understood.  Final Clinical Impression(s) / ED Diagnoses Final diagnoses:  Nonspecific chest pain    Rx / DC Orders ED Discharge Orders         Ordered    famotidine (PEPCID) 20 MG tablet  2 times daily        07/21/20 1741           Valarie Merino, MD 07/21/20 1744

## 2020-11-08 ENCOUNTER — Ambulatory Visit: Payer: Self-pay

## 2020-11-08 NOTE — Telephone Encounter (Signed)
Pt called regarding dizzy spells upon standing. This has been going on for a few weeks.  Pt is dizzy after getting up both during the day and at night.   Pt is sitting up for a few moments prior to getting up and it is not helping.   Pt does claim to have BP issues.  Pt is on several medications that can have effects on equilibrium.   Pt lives with her son and daughter. Her daughter is also her aide.  Pt will go to the mobile clinic tomorrow for evaluation.   Reason for Disposition  [1] MILD dizziness (e.g., vertigo; walking normally) AND [2] has NOT been evaluated by physician for this  Answer Assessment - Initial Assessment Questions 1. DESCRIPTION: "Describe your dizziness."    Upon standing up.  2. VERTIGO: "Do you feel like either you or the room is spinning or tilting?"      Spinning 3. LIGHTHEADED: "Do you feel lightheaded?" (e.g., somewhat faint, woozy, weak upon standing)     yes 4. SEVERITY: "How bad is it?"  "Can you walk?"   - MILD: Feels slightly dizzy and unsteady, but is walking normally.   - MODERATE: Feels unsteady when walking, but not falling; interferes with normal activities (e.g., school, work).   - SEVERE: Unable to walk without falling, or requires assistance to walk without falling.     mild 5. ONSET:  "When did the dizziness begin?"     A few weeks back 6. AGGRAVATING FACTORS: "Does anything make it worse?" (e.g., standing, change in head position)     standing 7. CAUSE: "What do you think is causing the dizziness?"     unsure 8. RECURRENT SYMPTOM: "Have you had dizziness before?" If Yes, ask: "When was the last time?" "What happened that time?"     no 9. OTHER SYMPTOMS: "Do you have any other symptoms?" (e.g., headache, weakness, numbness, vomiting, earache)     no 10. PREGNANCY: "Is there any chance you are pregnant?" "When was your last menstrual period?"       no  Protocols used: Dizziness - Vertigo-A-AH

## 2020-11-17 ENCOUNTER — Other Ambulatory Visit: Payer: Self-pay | Admitting: Family Medicine

## 2020-11-17 DIAGNOSIS — M62838 Other muscle spasm: Secondary | ICD-10-CM

## 2020-11-17 NOTE — Telephone Encounter (Signed)
Requested medication (s) are due for refill today: Yes  Requested medication (s) are on the active medication list: Yes  Last refill:  03/31/20  Future visit scheduled:Yes  Notes to clinic:  Unable to refill per protocol, cannot delegate.      Requested Prescriptions  Pending Prescriptions Disp Refills   cyclobenzaprine (FLEXERIL) 10 MG tablet [Pharmacy Med Name: CYCLOBENZAPRINE 10MG TABLETS] 60 tablet 3    Sig: TAKE 1 TABLET(10 MG) BY MOUTH TWICE DAILY AS NEEDED FOR MUSCLE SPASMS     Not Delegated - Analgesics:  Muscle Relaxants Failed - 11/17/2020 12:38 PM      Failed - This refill cannot be delegated      Failed - Valid encounter within last 6 months    Recent Outpatient Visits           7 months ago Bipolar affective disorder, currently depressed, moderate ([place])   [place] Charlott Rakes, MD   1 year ago Acute right ankle pain   [place], Enobong, MD   1 year ago Kidney pain   [place], [place], [place]   1 year ago Right flank pain   [place], Enobong, MD   1 year ago Gastroesophageal reflux disease, unspecified whether esophagitis present   [place], [place]       Future Appointments             In 2 weeks Thereasa Solo, Dionne Bucy, PA-C [place]

## 2020-11-18 NOTE — Telephone Encounter (Signed)
Pt called in to follow up on refill request. Advised pt of the status.

## 2020-11-21 ENCOUNTER — Other Ambulatory Visit: Payer: Self-pay | Admitting: Family Medicine

## 2020-11-21 DIAGNOSIS — I10 Essential (primary) hypertension: Secondary | ICD-10-CM

## 2020-11-21 NOTE — Telephone Encounter (Signed)
Future OV 12/02/20  Approved per protocol with scheduled OV.

## 2020-11-24 ENCOUNTER — Ambulatory Visit: Payer: Medicaid Other | Attending: Physician Assistant | Admitting: Family Medicine

## 2020-11-24 ENCOUNTER — Other Ambulatory Visit: Payer: Self-pay

## 2020-11-24 VITALS — BP 109/74 | HR 106 | Ht 64.0 in | Wt 288.0 lb

## 2020-11-24 DIAGNOSIS — I10 Essential (primary) hypertension: Secondary | ICD-10-CM | POA: Insufficient documentation

## 2020-11-24 DIAGNOSIS — Z79899 Other long term (current) drug therapy: Secondary | ICD-10-CM | POA: Diagnosis not present

## 2020-11-24 DIAGNOSIS — F319 Bipolar disorder, unspecified: Secondary | ICD-10-CM | POA: Insufficient documentation

## 2020-11-24 DIAGNOSIS — Z7182 Exercise counseling: Secondary | ICD-10-CM | POA: Insufficient documentation

## 2020-11-24 DIAGNOSIS — Z131 Encounter for screening for diabetes mellitus: Secondary | ICD-10-CM | POA: Diagnosis not present

## 2020-11-24 DIAGNOSIS — F419 Anxiety disorder, unspecified: Secondary | ICD-10-CM | POA: Insufficient documentation

## 2020-11-24 DIAGNOSIS — Z23 Encounter for immunization: Secondary | ICD-10-CM | POA: Diagnosis not present

## 2020-11-24 DIAGNOSIS — Z1211 Encounter for screening for malignant neoplasm of colon: Secondary | ICD-10-CM

## 2020-11-24 DIAGNOSIS — Z6841 Body Mass Index (BMI) 40.0 and over, adult: Secondary | ICD-10-CM | POA: Diagnosis not present

## 2020-11-24 DIAGNOSIS — Z713 Dietary counseling and surveillance: Secondary | ICD-10-CM | POA: Insufficient documentation

## 2020-11-24 DIAGNOSIS — K219 Gastro-esophageal reflux disease without esophagitis: Secondary | ICD-10-CM | POA: Diagnosis not present

## 2020-11-24 DIAGNOSIS — R5383 Other fatigue: Secondary | ICD-10-CM | POA: Diagnosis not present

## 2020-11-24 DIAGNOSIS — M62838 Other muscle spasm: Secondary | ICD-10-CM | POA: Diagnosis not present

## 2020-11-24 DIAGNOSIS — Z1231 Encounter for screening mammogram for malignant neoplasm of breast: Secondary | ICD-10-CM

## 2020-11-24 MED ORDER — HYDROCHLOROTHIAZIDE 25 MG PO TABS: ORAL_TABLET | ORAL | 1 refills | Status: DC

## 2020-11-24 MED ORDER — LISINOPRIL 5 MG PO TABS: 5.0000 mg | ORAL_TABLET | Freq: Every day | ORAL | 1 refills | Status: DC

## 2020-11-24 MED ORDER — CYCLOBENZAPRINE HCL 10 MG PO TABS: 10.0000 mg | ORAL_TABLET | Freq: Two times a day (BID) | ORAL | 6 refills | Status: DC | PRN

## 2020-11-24 NOTE — Patient Instructions (Signed)

## 2020-11-24 NOTE — Progress Notes (Signed)
Dizzy spells for 2 weeks.  Weakness over entire body.

## 2020-11-24 NOTE — Progress Notes (Signed)
Subjective:  Patient ID: Kathryn Larson, female    DOB: [birth year]  Age: 50 y.o. MRN: [id number]  CC: Fatigue   HPI Kathryn Larson is a 50 y.o. year old female with a history of hypertension, bipolar disorder, GERD who presents today for follow up visit.  Interval History: Complains of fatigue for a couple of months. She sleeps for about 3 hours as her cramps in her legs and back wake her up.  She also has a lot going on and her mind races and she finds it difficult to sleep.  Her muscle relaxants are effective with regards to her muscle cramps Her Psychiatrist is Dr Rosine Door and she is on Hydroxyzine and Klonopin for anxiety. Denies overly exerting herself during the day. She is not able to exercise much due to having to use a walker. Compliant with her antihypertensive and also has her reflux medication which has been effective as well. Past Medical History:  Diagnosis Date   Anxiety    Bipolar affective ([place])    Bipolar disorder, unspecified ([place]) 03/26/2013   Calculus of gallbladder with acute cholecystitis, without mention of obstruction 03/26/2013   Carpal tunnel syndrome    GERD (gastroesophageal reflux disease) 1994   GERD (gastroesophageal reflux disease) 03/26/2013   Obesity    Obesity, Class III, BMI 40-49.9 (morbid obesity) ([place]) 03/26/2013   Ovarian cyst    Panic attacks    Rectal bleed    Hemorrhoids   Seizures ([place])    last seizure in May 2015    Past Surgical History:  Procedure Laterality Date   ANKLE FRACTURE SURGERY     Bilateral - right 2013, left 2010   CHOLECYSTECTOMY N/A 03/24/2013   Procedure: LAPAROSCOPIC CHOLECYSTECTOMY;  Surgeon: [name], MD;  Location: [facility] OR;  Service: General;  Laterality: N/A;   ECTOPIC PREGNANCY SURGERY      Family History  Problem Relation Age of Onset   Diabetes Mother    Diabetes Sister    Cancer Maternal Grandmother     No Known Allergies  Outpatient Medications Prior to Visit  Medication Sig Dispense Refill    clonazePAM (KLONOPIN) 0.5 MG tablet Take 0.5 mg by mouth 2 (two) times daily as needed.     divalproex (DEPAKOTE ER) 500 MG 24 hr tablet Take 500 mg by mouth 2 (two) times daily.     famotidine (PEPCID) 20 MG tablet Take 1 tablet (20 mg total) by mouth 2 (two) times daily. 30 tablet 0   hydrochlorothiazide (HYDRODIURIL) 25 MG tablet TAKE 1 TABLET(25 MG) BY MOUTH DAILY 90 tablet 0   hydrOXYzine (VISTARIL) 50 MG capsule Take 1 capsule (50 mg total) by mouth every 8 (eight) hours as needed for anxiety or itching. 90 capsule 0   ibuprofen (ADVIL) 800 MG tablet Take 1 tablet (800 mg total) by mouth every 8 (eight) hours as needed. 30 tablet 5   lisinopril (ZESTRIL) 5 MG tablet TAKE 1 TABLET BY MOUTH DAILY (Patient taking differently: Take 5 mg by mouth daily.) 30 tablet 6   naproxen (NAPROSYN) 500 MG tablet Take 1 tablet (500 mg total) by mouth 2 (two) times daily with a meal. Prn pain 60 tablet 1   omeprazole (PRILOSEC) 20 MG capsule Take 1 capsule (20 mg total) by mouth daily. 30 capsule 6   prazosin (MINIPRESS) 5 MG capsule Take 5 mg by mouth daily.   0   ziprasidone (GEODON) 80 MG capsule Take 80 mg by mouth 2 (two) times daily.  cyclobenzaprine (FLEXERIL) 10 MG tablet Take 1 tablet (10 mg total) by mouth 2 (two) times daily as needed for muscle spasms. 60 tablet 3   acetaminophen-codeine (TYLENOL #3) 300-30 MG tablet Take 1 tablet by mouth every 12 (twelve) hours as needed for moderate pain. (Patient not taking: Reported on 11/24/2020) 60 tablet 0   ALPRAZolam (XANAX) 1 MG tablet Take 1 mg by mouth 3 (three) times daily. (Patient not taking: No sig reported)     cetirizine (ZYRTEC) 10 MG tablet Take 1 tablet (10 mg total) by mouth daily. (Patient not taking: Reported on 11/24/2020) 30 tablet 1   escitalopram (LEXAPRO) 20 MG tablet Take 20 mg by mouth daily.  (Patient not taking: Reported on 11/24/2020)  0   sertraline (ZOLOFT) 100 MG tablet Take 100 mg by mouth 3 (three) times daily. (Patient not  taking: Reported on 11/24/2020)     tinidazole (TINDAMAX) 500 MG tablet Take 2 tablets (1,000 mg total) by mouth daily with breakfast. (Patient not taking: Reported on 11/24/2020) 10 tablet 2   topiramate (TOPAMAX) 50 MG tablet Take 1 tablet (50 mg total) by mouth 2 (two) times daily. (Patient not taking: Reported on 11/24/2020) 60 tablet 6   No facility-administered medications prior to visit.     ROS Review of Systems  Constitutional:  Positive for fatigue. Negative for activity change and appetite change.  HENT:  Negative for congestion, sinus pressure and sore throat.   Eyes:  Negative for visual disturbance.  Respiratory:  Negative for cough, chest tightness, shortness of breath and wheezing.   Cardiovascular:  Negative for chest pain and palpitations.  Gastrointestinal:  Negative for abdominal distention, abdominal pain and constipation.  Endocrine: Negative for polydipsia.  Genitourinary:  Negative for dysuria and frequency.  Musculoskeletal:  Positive for myalgias. Negative for arthralgias and back pain.  Skin:  Negative for rash.  Neurological:  Negative for tremors, light-headedness and numbness.  Hematological:  Does not bruise/bleed easily.  Psychiatric/Behavioral:  Negative for agitation and behavioral problems.    Objective:  BP 109/74   Pulse (!) 106   Ht 5' 4"  (1.626 m)   Wt 288 lb (130.6 kg)   SpO2 98%   BMI 49.44 kg/m   BP/Weight 11/24/2020 9/93/7169 08/03/8936  Systolic BP 101 751 025  Diastolic BP 74 79 69  Wt. (Lbs) 288 - 279.2  BMI 49.44 - 47.92      Physical Exam Constitutional:      Appearance: She is well-developed.  Cardiovascular:     Rate and Rhythm: Tachycardia present.     Heart sounds: Normal heart sounds. No murmur heard. Pulmonary:     Effort: Pulmonary effort is normal.     Breath sounds: Normal breath sounds. No wheezing or rales.  Chest:     Chest wall: No tenderness.  Abdominal:     General: Bowel sounds are normal. There is no  distension.     Palpations: Abdomen is soft. There is no mass.     Tenderness: There is no abdominal tenderness.  Musculoskeletal:        General: Normal range of motion.     Right lower leg: No edema.     Left lower leg: No edema.  Neurological:     Mental Status: She is alert and oriented to person, place, and time.  Psychiatric:        Mood and Affect: Mood normal.    CMP Latest Ref Rng & Units 07/21/2020 04/14/2020 07/22/2019  Glucose 70 -  99 mg/dL 103(H) 87 95  BUN 6 - 20 mg/dL 7 10 6   Creatinine 0.44 - 1.00 mg/dL 1.01(H) 1.09(H) 0.88  Sodium 135 - 145 mmol/L 137 137 139  Potassium 3.5 - 5.1 mmol/L 4.0 4.1 4.3  Chloride 98 - 111 mmol/L 105 101 105  CO2 22 - 32 mmol/L 22 19(L) 26  Calcium 8.9 - 10.3 mg/dL 9.3 10.0 8.9  Total Protein 6.0 - 8.5 g/dL - - -  Total Bilirubin 0.0 - 1.2 mg/dL - - -  Alkaline Phos 39 - 117 IU/L - - -  AST 0 - 40 IU/L - - -  ALT 0 - 32 IU/L - - -    Lipid Panel     Component Value Date/Time   CHOL 191 11/19/2015 1116   TRIG 95 11/19/2015 1116   HDL 40 (L) 11/19/2015 1116   CHOLHDL 4.8 11/19/2015 1116   VLDL 19 11/19/2015 1116   LDLCALC 132 (H) 11/19/2015 1116    CBC    Component Value Date/Time   WBC 11.6 (H) 07/21/2020 1609   RBC 4.53 07/21/2020 1609   HGB 14.2 07/21/2020 1609   HGB 15.0 11/28/2018 1426   HCT 42.6 07/21/2020 1609   HCT 44.6 11/28/2018 1426   PLT 318 07/21/2020 1609   PLT 342 11/28/2018 1426   MCV 94.0 07/21/2020 1609   MCV 93 11/28/2018 1426   MCH 31.3 07/21/2020 1609   MCHC 33.3 07/21/2020 1609   RDW 14.6 07/21/2020 1609   RDW 12.8 11/28/2018 1426   LYMPHSABS 2.8 11/28/2018 1426   MONOABS 0.8 05/16/2014 1730   EOSABS 0.2 11/28/2018 1426   BASOSABS 0.0 11/28/2018 1426    No results found for: HGBA1C  Assessment & Plan:  1. Muscle spasm Weight loss will be beneficial We have discussed an exercise regimen to help with this - cyclobenzaprine (FLEXERIL) 10 MG tablet; Take 1 tablet (10 mg total) by mouth 2  (two) times daily as needed for muscle spasms.  Dispense: 60 tablet; Refill: 6 - Ambulatory referral to [facility]  2. Other fatigue Will need to exclude other underlying causes but insomnia is playing a huge role Counseled on sleep hygiene Referred for aquatic therapy - VITAMIN D 25 Hydroxy (Vit-D Deficiency, Fractures) - T4, free - TSH  3. Essential hypertension Controlled Counseled on blood pressure goal of less than 130/80, low-sodium, DASH diet, medication compliance, 150 minutes of moderate intensity exercise per week. Discussed medication compliance, adverse effects. - hydrochlorothiazide (HYDRODIURIL) 25 MG tablet; TAKE 1 TABLET(25 MG) BY MOUTH DAILY  Dispense: 90 tablet; Refill: 1 - lisinopril (ZESTRIL) 5 MG tablet; Take 1 tablet (5 mg total) by mouth daily.  Dispense: 90 tablet; Refill: 1  4. Obesity, Class III, BMI 40-49.9 (morbid obesity) ([place]) Counseled on caloric restriction, avoiding late meals - Ambulatory referral to [facility]  5. Screening for colon cancer - Cologuard  6. Encounter for screening mammogram for malignant neoplasm of breast - MM 3D SCREEN BREAST BILATERAL; Future  7. Screening for diabetes mellitus - Hemoglobin A1c  8. Need for immunization against influenza - Flu Vaccine QUAD 47moIM (Fluarix, Fluzone & Alfiuria Quad PF)    Meds ordered this encounter  Medications   cyclobenzaprine (FLEXERIL) 10 MG tablet    Sig: Take 1 tablet (10 mg total) by mouth 2 (two) times daily as needed for muscle spasms.    Dispense:  60 tablet    Refill:  6   Return in about 6 months (  around 05/24/2021) for Medical conditions.       Charlott Rakes, MD, FAAFP. [facility] [place], [place]   11/24/2020, 2:41 PM

## 2020-11-25 ENCOUNTER — Other Ambulatory Visit: Payer: Self-pay | Admitting: Family Medicine

## 2020-11-25 ENCOUNTER — Encounter: Payer: Self-pay | Admitting: Family Medicine

## 2020-11-25 LAB — VITAMIN D 25 HYDROXY (VIT D DEFICIENCY, FRACTURES): Vit D, 25-Hydroxy: 25.7 ng/mL — ABNORMAL LOW (ref 30.0–100.0)

## 2020-11-25 LAB — HEMOGLOBIN A1C
Est. average glucose Bld gHb Est-mCnc: 111 mg/dL
Hgb A1c MFr Bld: 5.5 % (ref 4.8–5.6)

## 2020-11-25 LAB — TSH: TSH: 0.597 u[IU]/mL (ref 0.450–4.500)

## 2020-11-25 LAB — T4, FREE: Free T4: 0.87 ng/dL (ref 0.82–1.77)

## 2020-11-25 MED ORDER — ERGOCALCIFEROL 1.25 MG (50000 UT) PO CAPS: 50000.0000 [IU] | ORAL_CAPSULE | ORAL | 1 refills | Status: DC

## 2020-11-26 ENCOUNTER — Telehealth: Payer: Self-pay

## 2020-11-26 NOTE — Telephone Encounter (Signed)
Patient name and DOB has been verified Patient was informed of lab results. Patient had no questions.  

## 2020-11-26 NOTE — Telephone Encounter (Signed)
-----   Message from Charlott Rakes, MD sent at 11/25/2020 10:27 AM EDT ----- Please inform her that her thyroid panel is normal and other labs are stable but vitamin D is low which could explain her fatigue.  I have sent a prescription for vitamin D replacement to her pharmacy.  Her level will be checked at her next office visit.

## 2020-12-02 ENCOUNTER — Ambulatory Visit: Payer: Medicaid Other | Admitting: Physician Assistant

## 2021-01-23 LAB — COLOGUARD: COLOGUARD: NEGATIVE

## 2021-01-25 ENCOUNTER — Telehealth: Payer: Self-pay

## 2021-01-25 NOTE — Telephone Encounter (Signed)
Patient name and DOB has been verified Patient was informed of lab results. Patient had no questions.  

## 2021-01-25 NOTE — Telephone Encounter (Signed)
-----   Message from Charlott Rakes, MD sent at 01/25/2021  4:42 AM EST ----- Please inform the patient that labs are normal. Thank you.

## 2021-03-20 ENCOUNTER — Other Ambulatory Visit: Payer: Self-pay | Admitting: Family Medicine

## 2021-03-20 DIAGNOSIS — G44229 Chronic tension-type headache, not intractable: Secondary | ICD-10-CM

## 2021-03-20 DIAGNOSIS — I10 Essential (primary) hypertension: Secondary | ICD-10-CM

## 2021-03-20 NOTE — Telephone Encounter (Signed)
Requested medication (s) are due for refill today: yes  Requested medication (s) are on the active medication list: yes  Last refill:  03/31/20 #60 6 RF  Future visit scheduled: no  Notes to clinic:  med not delegated to NT to RF   Requested Prescriptions  Pending Prescriptions Disp Refills   topiramate (TOPAMAX) 50 MG tablet [Pharmacy Med Name: TOPIRAMATE 50MG TABLETS] 60 tablet     Sig: TAKE 1 TABLET(50 MG) BY MOUTH TWICE DAILY     Not Delegated - Neurology: Anticonvulsants - topiramate & zonisamide Failed - 03/20/2021  8:01 AM      Failed - This refill cannot be delegated      Failed - Cr in normal range and within 360 days    Creat  Date Value Ref Range Status  11/19/2015 0.89 0.50 - 1.10 mg/dL Final   Creatinine, Ser  Date Value Ref Range Status  07/21/2020 1.01 (H) 0.44 - 1.00 mg/dL Final          Passed - CO2 in normal range and within 360 days    CO2  Date Value Ref Range Status  07/21/2020 22 22 - 32 mmol/L Final          Passed - Valid encounter within last 12 months    Recent Outpatient Visits           3 months ago Other fatigue   [place], [place], MD   11 months ago Bipolar affective disorder, currently depressed, moderate ([place])   [place], [place], MD   1 year ago Acute right ankle pain   [place], [place], MD   1 year ago Kidney pain   [place], [place], [place]   2 years ago Right flank pain   [place], [place], MD              Signed Prescriptions Disp Refills   lisinopril (ZESTRIL) 5 MG tablet 90 tablet 0    Sig: TAKE 1 TABLET(5 MG) BY MOUTH DAILY     Cardiovascular:  ACE Inhibitors Failed - 03/20/2021  8:01 AM      Failed - Cr in normal range and within 180 days    Creat  Date Value Ref Range Status  11/19/2015 0.89 0.50 - 1.10 mg/dL  Final   Creatinine, Ser  Date Value Ref Range Status  07/21/2020 1.01 (H) 0.44 - 1.00 mg/dL Final          Failed - K in normal range and within 180 days    Potassium  Date Value Ref Range Status  07/21/2020 4.0 3.5 - 5.1 mmol/L Final          Passed - Patient is not pregnant      Passed - Last BP in normal range    BP Readings from Last 1 Encounters:  11/24/20 109/74          Passed - Valid encounter within last 6 months    Recent Outpatient Visits           3 months ago Other fatigue   [place], Charlane Ferretti, MD   11 months ago Bipolar affective disorder, currently depressed, moderate ([place])   [place], Enobong, MD   1 year ago Acute right ankle pain   [facility] Charlott Rakes, MD   1 year ago Kidney pain   [place], [place]   2 years ago Right flank pain   [facility] Charlott Rakes, MD

## 2021-03-20 NOTE — Telephone Encounter (Signed)
Requested Prescriptions  Pending Prescriptions Disp Refills   lisinopril (ZESTRIL) 5 MG tablet [Pharmacy Med Name: LISINOPRIL 5MG TABLETS] 90 tablet 0    Sig: TAKE 1 TABLET(5 MG) BY MOUTH DAILY     Cardiovascular:  ACE Inhibitors Failed - 03/20/2021  8:01 AM      Failed - Cr in normal range and within 180 days    Creat  Date Value Ref Range Status  11/19/2015 0.89 0.50 - 1.10 mg/dL Final   Creatinine, Ser  Date Value Ref Range Status  07/21/2020 1.01 (H) 0.44 - 1.00 mg/dL Final         Failed - K in normal range and within 180 days    Potassium  Date Value Ref Range Status  07/21/2020 4.0 3.5 - 5.1 mmol/L Final         Passed - Patient is not pregnant      Passed - Last BP in normal range    BP Readings from Last 1 Encounters:  11/24/20 109/74         Passed - Valid encounter within last 6 months    Recent Outpatient Visits          3 months ago Other fatigue   [place], [place], MD   11 months ago Bipolar affective disorder, currently depressed, moderate ([place])   [place], [place], MD   1 year ago Acute right ankle pain   [place], [place], MD   1 year ago Kidney pain   [place], [place], [place]   2 years ago Right flank pain   [place], [place], MD              topiramate (TOPAMAX) 50 MG tablet [Pharmacy Med Name: TOPIRAMATE 50MG TABLETS] 60 tablet     Sig: TAKE 1 TABLET(50 MG) BY MOUTH TWICE DAILY     Not Delegated - Neurology: Anticonvulsants - topiramate & zonisamide Failed - 03/20/2021  8:01 AM      Failed - This refill cannot be delegated      Failed - Cr in normal range and within 360 days    Creat  Date Value Ref Range Status  11/19/2015 0.89 0.50 - 1.10 mg/dL Final   Creatinine, Ser  Date Value Ref Range Status  07/21/2020 1.01 (H) 0.44 - 1.00 mg/dL  Final         Passed - CO2 in normal range and within 360 days    CO2  Date Value Ref Range Status  07/21/2020 22 22 - 32 mmol/L Final         Passed - Valid encounter within last 12 months    Recent Outpatient Visits          3 months ago Other fatigue   [place], [place], MD   11 months ago Bipolar affective disorder, currently depressed, moderate ([place])   [place], Enobong, MD   1 year ago Acute right ankle pain   [place], Enobong, MD   1 year ago Kidney pain   [place], [place]   2 years ago Right flank pain   [facility] Charlott Rakes, MD

## 2021-04-09 ENCOUNTER — Other Ambulatory Visit: Payer: Self-pay | Admitting: Family Medicine

## 2021-04-09 DIAGNOSIS — K219 Gastro-esophageal reflux disease without esophagitis: Secondary | ICD-10-CM

## 2021-04-11 NOTE — Telephone Encounter (Signed)
Requested medication (s) are due for refill today: Yes  Requested medication (s) are on the active medication list: Yes  Last refill:  03/31/20  Future visit scheduled: No  Notes to clinic:  Prescription expired.    Requested Prescriptions  Pending Prescriptions Disp Refills   omeprazole (PRILOSEC) 20 MG capsule [Pharmacy Med Name: OMEPRAZOLE 20MG CAPSULES] 30 capsule 6    Sig: TAKE 1 CAPSULE(20 MG) BY MOUTH DAILY     Gastroenterology: Proton Pump Inhibitors Passed - 04/09/2021 11:24 AM      Passed - Valid encounter within last 12 months    Recent Outpatient Visits           4 months ago Other fatigue   [place], Charlane Ferretti, MD   1 year ago Bipolar affective disorder, currently depressed, moderate ([place])   [place], Enobong, MD   1 year ago Acute right ankle pain   [place], Enobong, MD   2 years ago Kidney pain   [place], [place]   2 years ago Right flank pain   [facility] Charlott Rakes, MD

## 2021-04-22 ENCOUNTER — Telehealth: Payer: Self-pay | Admitting: Family Medicine

## 2021-04-22 DIAGNOSIS — K219 Gastro-esophageal reflux disease without esophagitis: Secondary | ICD-10-CM

## 2021-04-22 NOTE — Telephone Encounter (Signed)
Request for future fill- Rx 04/12/21 #30- too soon and has notes Requested Prescriptions  Pending Prescriptions Disp Refills   omeprazole (PRILOSEC) 20 MG capsule [Pharmacy Med Name: OMEPRAZOLE 20MG CAPSULES] 30 capsule 0    Sig: TAKE 1 CAPSULE(20 MG) BY MOUTH DAILY     Gastroenterology: Proton Pump Inhibitors Passed - 04/22/2021  8:01 AM      Passed - Valid encounter within last 12 months    Recent Outpatient Visits          4 months ago Other fatigue   [place], Charlane Ferretti, MD   1 year ago Bipolar affective disorder, currently depressed, moderate ([place])   [place], Enobong, MD   1 year ago Acute right ankle pain   [place], Enobong, MD   2 years ago Kidney pain   [place], [place]   2 years ago Right flank pain   [place] [facility] Charlott Rakes, MD

## 2021-04-25 NOTE — Telephone Encounter (Signed)
Pt was called and informed that she has to make an office visit for any refills needed. Pt states that she will call back and make appointment.

## 2021-06-22 ENCOUNTER — Ambulatory Visit: Payer: Medicaid Other | Admitting: Obstetrics

## 2021-06-29 ENCOUNTER — Encounter: Payer: Self-pay | Admitting: Family Medicine

## 2021-06-29 ENCOUNTER — Ambulatory Visit: Payer: Medicaid Other | Attending: Family Medicine | Admitting: Family Medicine

## 2021-06-29 VITALS — BP 125/84 | HR 98 | Ht 64.0 in | Wt 280.0 lb

## 2021-06-29 DIAGNOSIS — Z1231 Encounter for screening mammogram for malignant neoplasm of breast: Secondary | ICD-10-CM | POA: Diagnosis not present

## 2021-06-29 DIAGNOSIS — M25571 Pain in right ankle and joints of right foot: Secondary | ICD-10-CM | POA: Diagnosis not present

## 2021-06-29 DIAGNOSIS — M545 Low back pain, unspecified: Secondary | ICD-10-CM | POA: Diagnosis not present

## 2021-06-29 DIAGNOSIS — I10 Essential (primary) hypertension: Secondary | ICD-10-CM | POA: Insufficient documentation

## 2021-06-29 DIAGNOSIS — Z0001 Encounter for general adult medical examination with abnormal findings: Secondary | ICD-10-CM

## 2021-06-29 DIAGNOSIS — F17210 Nicotine dependence, cigarettes, uncomplicated: Secondary | ICD-10-CM | POA: Insufficient documentation

## 2021-06-29 DIAGNOSIS — M25572 Pain in left ankle and joints of left foot: Secondary | ICD-10-CM | POA: Diagnosis not present

## 2021-06-29 DIAGNOSIS — G8929 Other chronic pain: Secondary | ICD-10-CM | POA: Diagnosis not present

## 2021-06-29 DIAGNOSIS — K219 Gastro-esophageal reflux disease without esophagitis: Secondary | ICD-10-CM | POA: Insufficient documentation

## 2021-06-29 DIAGNOSIS — F319 Bipolar disorder, unspecified: Secondary | ICD-10-CM | POA: Diagnosis not present

## 2021-06-29 DIAGNOSIS — Z13228 Encounter for screening for other metabolic disorders: Secondary | ICD-10-CM

## 2021-06-29 DIAGNOSIS — Z0000 Encounter for general adult medical examination without abnormal findings: Secondary | ICD-10-CM

## 2021-06-29 DIAGNOSIS — Z1211 Encounter for screening for malignant neoplasm of colon: Secondary | ICD-10-CM

## 2021-06-29 DIAGNOSIS — M62838 Other muscle spasm: Secondary | ICD-10-CM | POA: Insufficient documentation

## 2021-06-29 DIAGNOSIS — G44229 Chronic tension-type headache, not intractable: Secondary | ICD-10-CM | POA: Diagnosis not present

## 2021-06-29 DIAGNOSIS — F17200 Nicotine dependence, unspecified, uncomplicated: Secondary | ICD-10-CM | POA: Insufficient documentation

## 2021-06-29 MED ORDER — NICOTINE 14 MG/24HR TD PT24: 14.0000 mg | MEDICATED_PATCH | Freq: Every day | TRANSDERMAL | 1 refills | Status: DC

## 2021-06-29 MED ORDER — LISINOPRIL 5 MG PO TABS: ORAL_TABLET | ORAL | 1 refills | Status: DC

## 2021-06-29 MED ORDER — CYCLOBENZAPRINE HCL 10 MG PO TABS: 10.0000 mg | ORAL_TABLET | Freq: Two times a day (BID) | ORAL | 6 refills | Status: DC | PRN

## 2021-06-29 MED ORDER — IBUPROFEN 800 MG PO TABS: 800.0000 mg | ORAL_TABLET | Freq: Two times a day (BID) | ORAL | 5 refills | Status: AC | PRN

## 2021-06-29 MED ORDER — TOPIRAMATE 50 MG PO TABS: ORAL_TABLET | ORAL | 0 refills | Status: DC

## 2021-06-29 MED ORDER — HYDROCHLOROTHIAZIDE 25 MG PO TABS: ORAL_TABLET | ORAL | 1 refills | Status: DC

## 2021-06-29 NOTE — Progress Notes (Signed)
? ?Subjective:  ?Patient ID: Kathryn Larson, female    DOB: 1971/02/04  Age: 51 y.o. MRN: [id number] ? ?CC: Annual Exam ? ? ?HPI ?Kathryn Larson is a 51 y.o. year old female with a history of hypertension, bipolar disorder, GERD who presents for an annual physical exam. ? ?Interval History: ?She is up-to-date on  Pap smears (NILM, HPV -ve).  No record of colonoscopy in her chart. ?Next Pap smear due in 2024 ?She is due for mammogram but this was ordered in 10/2020 which she never underwent. ?Does not exercise much ?She has regular eye and dental exam ?Smokes half ppd of Cigarettes. ? ?Complains of bilateral ankle pain and back pain ?States she hurt her ankles in 2011 and 2014  and she has rods in both ankles. Ibuprofen 874m is not effective ?Back pain has been present since 2014 when she fell down the stairs. ?Pain in both locations are 9/10 ?Flexeril works but she ran out of this. ? ?She is also needing refill of her medications for chronic medical conditions including her antihypertensive.  She also needs refill of Topamax for her migraines.  Her headaches are currently controlled ? ?Past Medical History:  ?Diagnosis Date  ? Anxiety   ? Bipolar affective ([facility]   ? Bipolar disorder, unspecified ([facility] 03/26/2013  ? Calculus of gallbladder with acute cholecystitis, without mention of obstruction 03/26/2013  ? Carpal tunnel syndrome   ? GERD (gastroesophageal reflux disease) 1994  ? GERD (gastroesophageal reflux disease) 03/26/2013  ? Obesity   ? Obesity, Class III, BMI 40-49.9 (morbid obesity) ([facility] 03/26/2013  ? Ovarian cyst   ? Panic attacks   ? Rectal bleed   ? Hemorrhoids  ? Seizures ([facility]   ? last seizure in May 2015  ? ? ?Past Surgical History:  ?Procedure Laterality Date  ? ANKLE FRACTURE SURGERY    ? Bilateral - right 2013, left 2010  ? CHOLECYSTECTOMY N/A 03/24/2013  ? Procedure: LAPAROSCOPIC CHOLECYSTECTOMY;  Surgeon: JGwenyth Ober MD;  [facility]  Service: General;  Laterality: N/A;  ? ECTOPIC PREGNANCY  SURGERY    ? ? ?Family History  ?Problem Relation Age of Onset  ? Diabetes Mother   ? Diabetes Sister   ? Cancer Maternal Grandmother   ? ? ?Social History  ? ?Socioeconomic History  ? Marital status: Legally Separated  ?  Spouse name: Not on file  ? Number of children: Not on file  ? Years of education: Not on file  ? Highest education level: Not on file  ?Occupational History  ? Occupation: disabled  ?  Comment: mental health and ankles  ?Tobacco Use  ? Smoking status: Every Day  ?  Packs/day: 0.25  ?  Types: Cigarettes  ? Smokeless tobacco: Never  ?Vaping Use  ? Vaping Use: Never used  ?Substance and Sexual Activity  ? Alcohol use: Yes  ?  Alcohol/week: 0.0 standard drinks  ?  Comment: occ.  ? Drug use: No  ? Sexual activity: Yes  ?  Partners: Male  ?  Birth control/protection: None  ?Other Topics Concern  ? Not on file  ?Social History Narrative  ? Not on file  ? ?Social Determinants of Health  ? ?Financial Resource Strain: Not on file  ?Food Insecurity: Not on file  ?Transportation Needs: Not on file  ?Physical Activity: Not on file  ?Stress: Not on file  ?Social Connections: Not on file  ? ? ?No Known Allergies ? ?Outpatient Medications Prior to Visit  ?Medication Sig  Dispense Refill  ? acetaminophen-codeine (TYLENOL #3) 300-30 MG tablet Take 1 tablet by mouth every 12 (twelve) hours as needed for moderate pain. 60 tablet 0  ? cetirizine (ZYRTEC) 10 MG tablet Take 1 tablet (10 mg total) by mouth daily. 30 tablet 1  ? clonazePAM (KLONOPIN) 0.5 MG tablet Take 0.5 mg by mouth 2 (two) times daily as needed.    ? divalproex (DEPAKOTE ER) 500 MG 24 hr tablet Take 500 mg by mouth 2 (two) times daily.    ? ergocalciferol (DRISDOL) 1.25 MG (50000 UT) capsule Take 1 capsule (50,000 Units total) by mouth once a week. 12 capsule 1  ? escitalopram (LEXAPRO) 20 MG tablet Take 20 mg by mouth daily.  0  ? famotidine (PEPCID) 20 MG tablet Take 1 tablet (20 mg total) by mouth 2 (two) times daily. 30 tablet 0  ? hydrOXYzine  (VISTARIL) 50 MG capsule Take 1 capsule (50 mg total) by mouth every 8 (eight) hours as needed for anxiety or itching. 90 capsule 0  ? naproxen (NAPROSYN) 500 MG tablet Take 1 tablet (500 mg total) by mouth 2 (two) times daily with a meal. Prn pain 60 tablet 1  ? omeprazole (PRILOSEC) 20 MG capsule TAKE 1 CAPSULE(20 MG) BY MOUTH DAILY 30 capsule 0  ? sertraline (ZOLOFT) 100 MG tablet Take 100 mg by mouth 3 (three) times daily.    ? ziprasidone (GEODON) 80 MG capsule Take 80 mg by mouth 2 (two) times daily.    ? cyclobenzaprine (FLEXERIL) 10 MG tablet Take 1 tablet (10 mg total) by mouth 2 (two) times daily as needed for muscle spasms. 60 tablet 6  ? hydrochlorothiazide (HYDRODIURIL) 25 MG tablet TAKE 1 TABLET(25 MG) BY MOUTH DAILY 90 tablet 1  ? ibuprofen (ADVIL) 800 MG tablet Take 1 tablet (800 mg total) by mouth every 8 (eight) hours as needed. 30 tablet 5  ? topiramate (TOPAMAX) 50 MG tablet TAKE 1 TABLET(50 MG) BY MOUTH TWICE DAILY 180 tablet 0  ? ALPRAZolam (XANAX) 1 MG tablet Take 1 mg by mouth 3 (three) times daily. (Patient not taking: Reported on 03/31/2020)    ? prazosin (MINIPRESS) 5 MG capsule Take 5 mg by mouth daily.  (Patient not taking: Reported on 06/29/2021)  0  ? tinidazole (TINDAMAX) 500 MG tablet Take 2 tablets (1,000 mg total) by mouth daily with breakfast. (Patient not taking: Reported on 11/24/2020) 10 tablet 2  ? lisinopril (ZESTRIL) 5 MG tablet TAKE 1 TABLET(5 MG) BY MOUTH DAILY (Patient not taking: Reported on 06/29/2021) 90 tablet 0  ? ?No facility-administered medications prior to visit.  ? ? ? ?ROS ?Review of Systems  ?Constitutional:  Negative for activity change, appetite change and fatigue.  ?HENT:  Negative for congestion, sinus pressure and sore throat.   ?Eyes:  Negative for visual disturbance.  ?Respiratory:  Negative for cough, chest tightness, shortness of breath and wheezing.   ?Cardiovascular:  Negative for chest pain and palpitations.  ?Gastrointestinal:  Negative for abdominal  distention, abdominal pain and constipation.  ?Endocrine: Negative for polydipsia.  ?Genitourinary:  Negative for dysuria and frequency.  ?Musculoskeletal:   ?     See HPI  ?Skin:  Negative for rash.  ?Neurological:  Negative for tremors, light-headedness and numbness.  ?Hematological:  Does not bruise/bleed easily.  ?Psychiatric/Behavioral:  Negative for agitation and behavioral problems.   ? ?Objective:  ?BP 125/84   Pulse 98   Ht 5' 4"  (1.626 m)   Wt 280 lb (127 kg)  SpO2 96%   BMI 48.06 kg/m?  ? ? ?  06/29/2021  ?  3:32 PM 11/24/2020  ?  2:16 PM 07/21/2020  ?  6:12 PM  ?BP/Weight  ?Systolic BP 979 892 119  ?Diastolic BP 84 74 79  ?Wt. (Lbs) 280 288   ?BMI 48.06 kg/m2 49.44 kg/m2   ? ? ? ? ?Physical Exam ?Constitutional:   ?   General: She is not in acute distress. ?   Appearance: She is well-developed. She is not diaphoretic.  ?HENT:  ?   Head: Normocephalic.  ?   Right Ear: External ear normal.  ?   Left Ear: External ear normal.  ?   Nose: Nose normal.  ?   Mouth/Throat:  ?   Mouth: Mucous membranes are moist.  ?Eyes:  ?   Extraocular Movements: Extraocular movements intact.  ?   Conjunctiva/sclera: Conjunctivae normal.  ?   Pupils: Pupils are equal, round, and reactive to light.  ?Neck:  ?   Vascular: No JVD.  ?Cardiovascular:  ?   Rate and Rhythm: Normal rate and regular rhythm.  ?   Pulses: Normal pulses.  ?   Heart sounds: Normal heart sounds. No murmur heard. ?  No gallop.  ?Pulmonary:  ?   Effort: Pulmonary effort is normal. No respiratory distress.  ?   Breath sounds: Normal breath sounds. No wheezing or rales.  ?Chest:  ?   Chest wall: No tenderness.  ?Breasts: ?   Right: No mass or tenderness.  ?   Left: No mass or tenderness.  ?Abdominal:  ?   General: Bowel sounds are normal. There is no distension.  ?   Palpations: Abdomen is soft. There is no mass.  ?   Tenderness: There is no abdominal tenderness.  ?Musculoskeletal:     ?   General: No tenderness.  ?   Cervical back: Normal range of motion.  ?    Comments: Tenderness on deep palpation of lumbar region ?Tenderness on eversion and inversion of bilateral ankles  ?Skin: ?   General: Skin is warm and dry.  ?Neurological:  ?   Mental Status: She is alert and

## 2021-06-29 NOTE — Patient Instructions (Signed)
Exercising to Stay Healthy To become healthy and stay healthy, it is recommended that you do moderate-intensity and vigorous-intensity exercise. You can tell that you are exercising at a moderate intensity if your heart starts beating faster and you start breathing faster but can still hold a conversation. You can tell that you are exercising at a vigorous intensity if you are breathing much harder and faster and cannot hold a conversation while exercising. How can exercise benefit me? Exercising regularly is important. It has many health benefits, such as: Improving overall fitness, flexibility, and endurance. Increasing bone density. Helping with weight control. Decreasing body fat. Increasing muscle strength and endurance. Reducing stress and tension, anxiety, depression, or anger. Improving overall health. What guidelines should I follow while exercising? Before you start a new exercise program, talk with your health care provider. Do not exercise so much that you hurt yourself, feel dizzy, or get very short of breath. Wear comfortable clothes and wear shoes with good support. Drink plenty of water while you exercise to prevent dehydration or heat stroke. Work out until your breathing and your heartbeat get faster (moderate intensity). How often should I exercise? Choose an activity that you enjoy, and set realistic goals. Your health care provider can help you make an activity plan that is individually designed and works best for you. Exercise regularly as told by your health care provider. This may include: Doing strength training two times a week, such as: Lifting weights. Using resistance bands. Push-ups. Sit-ups. Yoga. Doing a certain intensity of exercise for a given amount of time. Choose from these options: A total of 150 minutes of moderate-intensity exercise every week. A total of 75 minutes of vigorous-intensity exercise every week. A mix of moderate-intensity and  vigorous-intensity exercise every week. Children, pregnant women, people who have not exercised regularly, people who are overweight, and older adults may need to talk with a health care provider about what activities are safe to perform. If you have a medical condition, be sure to talk with your health care provider before you start a new exercise program. What are some exercise ideas? Moderate-intensity exercise ideas include: Walking 1 mile (1.6 km) in about 15 minutes. Biking. Hiking. Golfing. Dancing. Water aerobics. Vigorous-intensity exercise ideas include: Walking 4.5 miles (7.2 km) or more in about 1 hour. Jogging or running 5 miles (8 km) in about 1 hour. Biking 10 miles (16.1 km) or more in about 1 hour. Lap swimming. Roller-skating or in-line skating. Cross-country skiing. Vigorous competitive sports, such as football, basketball, and soccer. Jumping rope. Aerobic dancing. What are some everyday activities that can help me get exercise? Yard work, such as: Pushing a lawn mower. Raking and bagging leaves. Washing your car. Pushing a stroller. Shoveling snow. Gardening. Washing windows or floors. How can I be more active in my day-to-day activities? Use stairs instead of an elevator. Take a walk during your lunch break. If you drive, park your car farther away from your work or school. If you take public transportation, get off one stop early and walk the rest of the way. Stand up or walk around during all of your indoor phone calls. Get up, stretch, and walk around every 30 minutes throughout the day. Enjoy exercise with a friend. Support to continue exercising will help you keep a regular routine of activity. Where to find more information You can find more information about exercising to stay healthy from: U.S. Department of Health and Human Services: www.hhs.gov Centers for Disease Control and Prevention (  CDC): www.cdc.gov Summary Exercising regularly is  important. It will improve your overall fitness, flexibility, and endurance. Regular exercise will also improve your overall health. It can help you control your weight, reduce stress, and improve your bone density. Do not exercise so much that you hurt yourself, feel dizzy, or get very short of breath. Before you start a new exercise program, talk with your health care provider. This information is not intended to replace advice given to you by your health care provider. Make sure you discuss any questions you have with your health care provider. Document Revised: 06/11/2020 Document Reviewed: 06/11/2020 Elsevier Patient Education  2023 Elsevier Inc.  

## 2021-06-29 NOTE — Progress Notes (Signed)
CPE ?Pain in ankles. ?

## 2021-07-05 ENCOUNTER — Other Ambulatory Visit: Payer: Medicaid Other

## 2021-07-13 ENCOUNTER — Ambulatory Visit: Payer: Medicaid Other | Attending: Family Medicine

## 2021-07-13 DIAGNOSIS — I10 Essential (primary) hypertension: Secondary | ICD-10-CM

## 2021-07-13 DIAGNOSIS — Z13228 Encounter for screening for other metabolic disorders: Secondary | ICD-10-CM

## 2021-07-14 LAB — CMP14+EGFR
ALT: 55 IU/L — ABNORMAL HIGH (ref 0–32)
AST: 61 IU/L — ABNORMAL HIGH (ref 0–40)
Albumin/Globulin Ratio: 1.3 (ref 1.2–2.2)
Albumin: 3.9 g/dL (ref 3.8–4.8)
Alkaline Phosphatase: 97 IU/L (ref 44–121)
BUN/Creatinine Ratio: 11 (ref 9–23)
BUN: 10 mg/dL (ref 6–24)
Bilirubin Total: 0.3 mg/dL (ref 0.0–1.2)
CO2: 20 mmol/L (ref 20–29)
Calcium: 9 mg/dL (ref 8.7–10.2)
Chloride: 104 mmol/L (ref 96–106)
Creatinine, Ser: 0.94 mg/dL (ref 0.57–1.00)
Globulin, Total: 2.9 g/dL (ref 1.5–4.5)
Glucose: 86 mg/dL (ref 70–99)
Potassium: 3.9 mmol/L (ref 3.5–5.2)
Sodium: 136 mmol/L (ref 134–144)
Total Protein: 6.8 g/dL (ref 6.0–8.5)
eGFR: 75 mL/min/{1.73_m2} (ref >59)

## 2021-07-14 LAB — CBC WITH DIFFERENTIAL/PLATELET
Basophils Absolute: 0.1 10*3/uL (ref 0.0–0.2)
Basos: 1 %
EOS (ABSOLUTE): 0.2 10*3/uL (ref 0.0–0.4)
Eos: 2 %
Hematocrit: 39.9 % (ref 34.0–46.6)
Hemoglobin: 13.7 g/dL (ref 11.1–15.9)
Immature Grans (Abs): 0.1 10*3/uL (ref 0.0–0.1)
Immature Granulocytes: 1 %
Lymphocytes Absolute: 3.7 10*3/uL — ABNORMAL HIGH (ref 0.7–3.1)
Lymphs: 36 %
MCH: 31.2 pg (ref 26.6–33.0)
MCHC: 34.3 g/dL (ref 31.5–35.7)
MCV: 91 fL (ref 79–97)
Monocytes Absolute: 0.7 10*3/uL (ref 0.1–0.9)
Monocytes: 7 %
Neutrophils Absolute: 5.7 10*3/uL (ref 1.4–7.0)
Neutrophils: 53 %
Platelets: 332 10*3/uL (ref 150–450)
RBC: 4.39 x10E6/uL (ref 3.77–5.28)
RDW: 13.5 % (ref 11.7–15.4)
WBC: 10.4 10*3/uL (ref 3.4–10.8)

## 2021-07-14 LAB — HEMOGLOBIN A1C
Est. average glucose Bld gHb Est-mCnc: 105 mg/dL
Hgb A1c MFr Bld: 5.3 % (ref 4.8–5.6)

## 2021-08-02 ENCOUNTER — Emergency Department (HOSPITAL_COMMUNITY): Payer: Medicaid Other

## 2021-08-02 ENCOUNTER — Encounter (HOSPITAL_COMMUNITY): Payer: Self-pay

## 2021-08-02 ENCOUNTER — Other Ambulatory Visit: Payer: Self-pay

## 2021-08-02 ENCOUNTER — Emergency Department (HOSPITAL_COMMUNITY)
Admission: EM | Admit: 2021-08-02 | Discharge: 2021-08-02 | Disposition: A | Discharge status: Home / Self Care | Payer: Medicaid Other | Attending: Emergency Medicine | Admitting: Emergency Medicine

## 2021-08-02 DIAGNOSIS — N939 Abnormal uterine and vaginal bleeding, unspecified: Secondary | ICD-10-CM | POA: Diagnosis not present

## 2021-08-02 LAB — COMPREHENSIVE METABOLIC PANEL
ALT: 67 U/L — ABNORMAL HIGH (ref 0–44)
AST: 75 U/L — ABNORMAL HIGH (ref 15–41)
Albumin: 3.6 g/dL (ref 3.5–5.0)
Alkaline Phosphatase: 89 U/L (ref 38–126)
Anion gap: 10 (ref 5–15)
BUN: 10 mg/dL (ref 6–20)
CO2: 23 mmol/L (ref 22–32)
Calcium: 9.2 mg/dL (ref 8.9–10.3)
Chloride: 104 mmol/L (ref 98–111)
Creatinine, Ser: 0.97 mg/dL (ref 0.44–1.00)
GFR, Estimated: >60 mL/min (ref >60)
Glucose, Bld: 92 mg/dL (ref 70–99)
Potassium: 3.7 mmol/L (ref 3.5–5.1)
Sodium: 137 mmol/L (ref 135–145)
Total Bilirubin: 0.7 mg/dL (ref 0.3–1.2)
Total Protein: 7.2 g/dL (ref 6.5–8.1)

## 2021-08-02 LAB — WET PREP, GENITAL
Sperm: NONE SEEN
Trich, Wet Prep: NONE SEEN
WBC, Wet Prep HPF POC: <10 (ref <10)
Yeast Wet Prep HPF POC: NONE SEEN

## 2021-08-02 LAB — CBC WITH DIFFERENTIAL/PLATELET
Abs Immature Granulocytes: 0.04 10*3/uL (ref 0.00–0.07)
Basophils Absolute: 0 10*3/uL (ref 0.0–0.1)
Basophils Relative: 1 %
Eosinophils Absolute: 0.1 10*3/uL (ref 0.0–0.5)
Eosinophils Relative: 2 %
HCT: 43.2 % (ref 36.0–46.0)
Hemoglobin: 14.4 g/dL (ref 12.0–15.0)
Immature Granulocytes: 1 %
Lymphocytes Relative: 38 %
Lymphs Abs: 3.1 10*3/uL (ref 0.7–4.0)
MCH: 31.2 pg (ref 26.0–34.0)
MCHC: 33.3 g/dL (ref 30.0–36.0)
MCV: 93.5 fL (ref 80.0–100.0)
Monocytes Absolute: 0.6 10*3/uL (ref 0.1–1.0)
Monocytes Relative: 8 %
Neutro Abs: 4.3 10*3/uL (ref 1.7–7.7)
Neutrophils Relative %: 50 %
Platelets: 339 10*3/uL (ref 150–400)
RBC: 4.62 MIL/uL (ref 3.87–5.11)
RDW: 15.7 % — ABNORMAL HIGH (ref 11.5–15.5)
WBC: 8.2 10*3/uL (ref 4.0–10.5)
nRBC: 0 % (ref 0.0–0.2)

## 2021-08-02 LAB — HCG, QUANTITATIVE, PREGNANCY: hCG, Beta Chain, Quant, S: 2 m[IU]/mL (ref <5)

## 2021-08-02 MED ORDER — MEGESTROL ACETATE 40 MG PO TABS: 40.0000 mg | ORAL_TABLET | Freq: Every day | ORAL | 0 refills | Status: AC

## 2021-08-02 NOTE — ED Notes (Signed)
Pt reports having vaginal bleeding with cramping for over a month. Denies dysuria/N/V/D and dizziness.

## 2021-08-02 NOTE — Discharge Instructions (Addendum)
Your hemoglobin was stable and your ultrasound did not reveal any acute abnormalities.  We will start you on a medicine called Megace for your abnormal uterine bleeding.  Recommend you follow-up with your scheduled gynecology appointment within the next week.

## 2021-08-02 NOTE — ED Triage Notes (Signed)
Pt arrived POV from home stating she started her period on 5/10 and has not stopped. Pt states it is not always heavy but she has not stopped completely bleeding since last month. Pt endorses some cramping as well.

## 2021-08-02 NOTE — ED Provider Notes (Signed)
[facility] EMERGENCY DEPARTMENT Provider Note   CSN: [id number] Arrival date & time: 08/02/21  1455     History  Chief Complaint  Patient presents with   Vaginal Bleeding    Kathryn Larson is a 51 y.o. female.   Vaginal Bleeding   51 year old female with medical history significant for GERD, bipolar disorder, anxiety, obesity, seizures presenting to the emergency department with a chief complaint of vaginal bleeding.  The patient states that she has had bleeding for roughly the past month that has not stopped.  She goes through multiple pads a day.  It has been intermittently been light versus heavy.  She has had some lower abdominal cramping but denies any pelvic pain at this time.  No nausea, vomiting, diarrhea.  She denies any shortness of breath or lightheadedness.  She has not followed up with GYN regarding this.  She has not gone through menopause.  Home Medications Prior to Admission medications   Medication Sig Start Date End Date Taking? Authorizing Provider  megestrol (MEGACE) 40 MG tablet Take 1 tablet (40 mg total) by mouth daily for 14 days. 08/02/21 08/16/21 Yes Regan Lemming, MD  acetaminophen-codeine (TYLENOL #3) 300-30 MG tablet Take 1 tablet by mouth every 12 (twelve) hours as needed for moderate pain. 04/28/19   Charlott Rakes, MD  ALPRAZolam Duanne Moron) 1 MG tablet Take 1 mg by mouth 3 (three) times daily. Patient not taking: Reported on 03/31/2020    [provider]  cetirizine (ZYRTEC) 10 MG tablet Take 1 tablet (10 mg total) by mouth daily. 12/18/17   Charlott Rakes, MD  clonazePAM (KLONOPIN) 0.5 MG tablet Take 0.5 mg by mouth 2 (two) times daily as needed. 11/10/20   [provider]  cyclobenzaprine (FLEXERIL) 10 MG tablet Take 1 tablet (10 mg total) by mouth 2 (two) times daily as needed for muscle spasms. 06/29/21   Charlott Rakes, MD  divalproex (DEPAKOTE ER) 500 MG 24 hr tablet Take 500 mg by mouth 2 (two) times daily. 04/09/19    [provider]  ergocalciferol (DRISDOL) 1.25 MG (50000 UT) capsule Take 1 capsule (50,000 Units total) by mouth once a week. 11/25/20   Charlott Rakes, MD  escitalopram (LEXAPRO) 20 MG tablet Take 20 mg by mouth daily. 07/19/16   [provider]  famotidine (PEPCID) 20 MG tablet Take 1 tablet (20 mg total) by mouth 2 (two) times daily. 07/21/20   Valarie Merino, MD  hydrochlorothiazide (HYDRODIURIL) 25 MG tablet TAKE 1 TABLET(25 MG) BY MOUTH DAILY 06/29/21   Charlott Rakes, MD  hydrOXYzine (VISTARIL) 50 MG capsule Take 1 capsule (50 mg total) by mouth every 8 (eight) hours as needed for anxiety or itching. 03/31/20   Charlott Rakes, MD  ibuprofen (ADVIL) 800 MG tablet Take 1 tablet (800 mg total) by mouth every 12 (twelve) hours as needed. 06/29/21   Charlott Rakes, MD  lisinopril (ZESTRIL) 5 MG tablet TAKE 1 TABLET(5 MG) BY MOUTH DAILY 06/29/21   Charlott Rakes, MD  nicotine (NICODERM CQ) 14 mg/24hr patch Place 1 patch (14 mg total) onto the skin daily. 06/29/21   Charlott Rakes, MD  omeprazole (PRILOSEC) 20 MG capsule TAKE 1 CAPSULE(20 MG) BY MOUTH DAILY 04/12/21   Charlott Rakes, MD  prazosin (MINIPRESS) 5 MG capsule Take 5 mg by mouth daily.  Patient not taking: Reported on 06/29/2021 07/19/16   [provider]  sertraline (ZOLOFT) 100 MG tablet Take 100 mg by mouth 3 (three) times daily.    [provider]  tinidazole (TINDAMAX) 500 MG tablet Take 2 tablets (1,000 mg total) by mouth daily with breakfast. Patient not taking: Reported on 11/24/2020 07/31/19   Shelly Bombard, MD  topiramate (TOPAMAX) 50 MG tablet TAKE 1 TABLET(50 MG) BY MOUTH TWICE DAILY 06/29/21   Charlott Rakes, MD  ziprasidone (GEODON) 80 MG capsule Take 80 mg by mouth 2 (two) times daily. 04/15/19   [provider]      Allergies    Patient has no known allergies.    Review of Systems   Review of Systems  Genitourinary:  Positive for vaginal bleeding.  All other systems reviewed and  are negative.   Physical Exam Updated Vital Signs BP 92/75 (BP Location: Right Arm)   Pulse 72   Temp 98 F (36.7 C) (Oral)   Resp 16   Ht 5' 4"  (1.626 m)   Wt 126.6 kg   SpO2 99%   BMI 47.89 kg/m  Physical Exam Vitals and nursing note reviewed. Exam conducted with a chaperone present.  Constitutional:      General: She is not in acute distress.    Appearance: She is well-developed.  HENT:     Head: Normocephalic and atraumatic.  Eyes:     Conjunctiva/sclera: Conjunctivae normal.  Cardiovascular:     Rate and Rhythm: Normal rate and regular rhythm.  Pulmonary:     Effort: Pulmonary effort is normal. No respiratory distress.     Breath sounds: Normal breath sounds.  Abdominal:     Palpations: Abdomen is soft.     Tenderness: There is no abdominal tenderness.  Genitourinary:    Comments: Mild oozing from the cervical os, no significant pelvic tenderness on bimanual exam Musculoskeletal:        General: No swelling.     Cervical back: Neck supple.  Skin:    General: Skin is warm and dry.     Capillary Refill: Capillary refill takes less than 2 seconds.  Neurological:     Mental Status: She is alert.  Psychiatric:        Mood and Affect: Mood normal.     ED Results / Procedures / Treatments   Labs (all labs ordered are listed, but only abnormal results are displayed) Labs Reviewed  WET PREP, GENITAL - Abnormal; Notable for the following components:      Result Value   Clue Cells Wet Prep HPF POC PRESENT (*)    All other components within normal limits  COMPREHENSIVE METABOLIC PANEL - Abnormal; Notable for the following components:   AST 75 (*)    ALT 67 (*)    All other components within normal limits  CBC WITH DIFFERENTIAL/PLATELET - Abnormal; Notable for the following components:   RDW 15.7 (*)    All other components within normal limits  HCG, QUANTITATIVE, PREGNANCY  GC/CHLAMYDIA PROBE AMP ([place]) NOT AT [facility]    EKG None  Radiology No  results found.  Procedures Procedures    Medications Ordered in ED Medications - No data to display  ED Course/ Medical Decision Making/ A&P Clinical Course as of 08/06/21 0710  Tue Aug 02, 2021  2007 Clue Cells Wet Prep HPF POC(!): PRESENT [JL]    Clinical Course User Index [JL] Regan Lemming, MD                           Medical Decision Making Amount and/or Complexity of Data Reviewed Labs: ordered. Decision-making details documented  in ED Course. Radiology: ordered.  Risk Prescription drug management.   51 year old female with medical history significant for GERD, bipolar disorder, anxiety, obesity, seizures presenting to the emergency department with a chief complaint of vaginal bleeding.  The patient states that she has had bleeding for roughly the past month that has not stopped.  She goes through multiple pads a day.  It has been intermittently been light versus heavy.  She has had some lower abdominal cramping but denies any pelvic pain at this time.  No nausea, vomiting, diarrhea.  She denies any shortness of breath or lightheadedness.  She has not followed up with GYN regarding this.  She has not gone through menopause.  On arrival, the patient was vitally stable.  Physical exam significant for minimal pelvic tenderness to palpation on bimanual exam, mild oozing present from the cervical os.  Differential diagnosis includes endometrial hyperplasia, malignancy, dysfunctional uterine bleeding, uterine fibroids, menopause.  The patient consents to GC/chlamydia testing and wet prep testing.  She had clue cells present on wet prep, negative for trichomonas and yeast.  GC/chlamydia was negative.  Her hCG was normal.  CBC was without a leukocytosis or anemia.  She has no symptoms of anemia at this time despite having gone through a reported multiple pads per day.  She is vitally stable at this time.  An ultrasound was performed of the pelvis which did not reveal any acute  abnormalities. IMPRESSION:  1. Nonvisualized left ovary.  2. Otherwise negative transabdominal pelvic ultrasound   We will start the patient on Megace and have the patient follow-up with GYN outpatient.  She has an appointment within the next week. Stable for discharge.  DC Instructions: Your hemoglobin was stable and your ultrasound did not reveal any acute abnormalities.  We will start you on a medicine called Megace for your abnormal uterine bleeding.  Recommend you follow-up with your scheduled gynecology appointment within the next week.  Final Clinical Impression(s) / ED Diagnoses Final diagnoses:  Vaginal bleeding  Abnormal uterine bleeding (AUB)    Rx / DC Orders ED Discharge Orders          Ordered    megestrol (MEGACE) 40 MG tablet  Daily        08/02/21 2030              Regan Lemming, MD 08/06/21 [phone]

## 2021-08-03 LAB — GC/CHLAMYDIA PROBE AMP (~~LOC~~) NOT AT ARMC
Chlamydia: NEGATIVE
Comment: NEGATIVE
Comment: NORMAL
Neisseria Gonorrhea: NEGATIVE

## 2021-08-10 ENCOUNTER — Encounter: Payer: Self-pay | Admitting: Obstetrics

## 2021-08-10 ENCOUNTER — Ambulatory Visit (INDEPENDENT_AMBULATORY_CARE_PROVIDER_SITE_OTHER): Payer: Medicaid Other | Admitting: Obstetrics

## 2021-08-10 ENCOUNTER — Other Ambulatory Visit (HOSPITAL_COMMUNITY)
Admission: RE | Admit: 2021-08-10 | Discharge: 2021-08-10 | Disposition: A | Discharge status: Home / Self Care | Payer: Medicaid Other | Source: Ambulatory Visit | Attending: Obstetrics | Admitting: Obstetrics

## 2021-08-10 VITALS — BP 108/75 | HR 99 | Ht 64.0 in | Wt 277.4 lb

## 2021-08-10 DIAGNOSIS — N939 Abnormal uterine and vaginal bleeding, unspecified: Secondary | ICD-10-CM

## 2021-08-10 NOTE — Progress Notes (Signed)
Endometrial Biopsy Procedure Note  Pre-operative Diagnosis: AUB  Post-operative Diagnosis: same  Indications: abnormal uterine bleeding  Procedure Details   Urine pregnancy test was done in office and result was negative.  The risks (including infection, bleeding, pain, and uterine perforation) and benefits of the procedure were explained to the patient and Written informed consent was obtained.    The patient was placed in the dorsal lithotomy position.  Bimanual exam showed the uterus to be in the neutral position.  A Graves' speculum inserted in the vagina, and the cervix prepped with povidone iodine.  Endocervical curettage with a Kevorkian curette was not performed.   A sharp tenaculum was applied to the anterior lip of the cervix for stabilization.  A sterile uterine sound was used to sound the uterus to a depth of 6cm.  A Pipelle endometrial aspirator was used to sample the endometrium.  Sample was sent for pathologic examination.  Condition: Stable  Complications: None  Plan:  The patient was advised to call for any fever or for prolonged or severe pain or bleeding. She was advised to use NSAID as needed for mild to moderate pain. She was advised to avoid vaginal intercourse for 48 hours or until the bleeding has completely stopped.  Attending Physician Documentation: I was present for or participated in the entire procedure, including opening and closing.    Shelly Bombard, MD 08/10/2021 3:50 PM

## 2021-08-10 NOTE — Progress Notes (Signed)
Endometrial Biopsy Procedure Note  Pre-operative Diagnosis: AUB  Post-operative Diagnosis: same  Indications: abnormal uterine bleeding  Procedure Details   Urine pregnancy test was done in office and result was negative.  The risks (including infection, bleeding, pain, and uterine perforation) and benefits of the procedure were explained to the patient and Written informed consent was obtained.    The patient was placed in the dorsal lithotomy position.  Bimanual exam showed the uterus to be in the neutral position.  A Graves' speculum inserted in the vagina, and the cervix prepped with povidone iodine.  Endocervical curettage with a Kevorkian curette was not performed.   A sharp tenaculum was applied to the anterior lip of the cervix for stabilization.  A sterile uterine sound was used to sound the uterus to a depth of 6cm.  A Pipelle endometrial aspirator was used to sample the endometrium.  Sample was sent for pathologic examination.  Condition: Stable  Complications: None  Plan:  The patient was advised to call for any fever or for prolonged or severe pain or bleeding. She was advised to use NSAID as needed for mild to moderate pain. She was advised to avoid vaginal intercourse for 48 hours or until the bleeding has completely stopped.  Attending Physician Documentation: I was present for or participated in the entire procedure, including opening and closing.    I have spent a total of 30 minutes of face-to-face time, excluding clinical staff time, reviewing notes and preparing to see patient, ordering tests and/or medications, and counseling the patient.    Shelly Bombard, MD 08/10/2021 3:45 PM

## 2021-08-10 NOTE — Progress Notes (Signed)
Pt presents for AUB.  Pt typically has regular periods around the 10th of every month that lasts 4 days. But noticed when she started her cycle on May 10th it has been going on intermittently. Denies heavy or painful periods but endorses pink spotting with dull stomach ache.

## 2021-08-12 LAB — SURGICAL PATHOLOGY

## 2021-08-18 ENCOUNTER — Ambulatory Visit
Admission: RE | Admit: 2021-08-18 | Discharge: 2021-08-18 | Disposition: A | Discharge status: Home / Self Care | Payer: Medicaid Other | Source: Ambulatory Visit | Attending: Family Medicine | Admitting: Family Medicine

## 2021-08-18 DIAGNOSIS — Z1231 Encounter for screening mammogram for malignant neoplasm of breast: Secondary | ICD-10-CM

## 2021-08-22 ENCOUNTER — Other Ambulatory Visit: Payer: Self-pay | Admitting: Family Medicine

## 2021-08-22 DIAGNOSIS — R928 Other abnormal and inconclusive findings on diagnostic imaging of breast: Secondary | ICD-10-CM

## 2021-08-23 ENCOUNTER — Telehealth: Payer: Self-pay

## 2021-09-01 ENCOUNTER — Ambulatory Visit: Payer: Medicaid Other | Admitting: Obstetrics

## 2021-09-02 ENCOUNTER — Other Ambulatory Visit: Payer: Self-pay | Admitting: Family Medicine

## 2021-09-02 ENCOUNTER — Ambulatory Visit
Admission: RE | Admit: 2021-09-02 | Discharge: 2021-09-02 | Disposition: A | Discharge status: Home / Self Care | Payer: Medicaid Other | Source: Ambulatory Visit | Attending: Family Medicine | Admitting: Family Medicine

## 2021-09-02 DIAGNOSIS — N6320 Unspecified lump in the left breast, unspecified quadrant: Secondary | ICD-10-CM

## 2021-09-02 DIAGNOSIS — R928 Other abnormal and inconclusive findings on diagnostic imaging of breast: Secondary | ICD-10-CM

## 2021-09-07 ENCOUNTER — Ambulatory Visit
Admission: RE | Admit: 2021-09-07 | Discharge: 2021-09-07 | Disposition: A | Discharge status: Home / Self Care | Payer: Medicaid Other | Source: Ambulatory Visit | Attending: Family Medicine | Admitting: Family Medicine

## 2021-09-07 DIAGNOSIS — N6320 Unspecified lump in the left breast, unspecified quadrant: Secondary | ICD-10-CM

## 2021-09-07 HISTORY — PX: BREAST BIOPSY: SHX20

## 2021-09-09 ENCOUNTER — Other Ambulatory Visit: Payer: Self-pay | Admitting: Family Medicine

## 2021-09-14 ENCOUNTER — Ambulatory Visit: Payer: Medicaid Other | Admitting: Obstetrics & Gynecology

## 2021-09-14 NOTE — Progress Notes (Signed)
I connected with  Kathryn Larson on 09/14/21 by a video enabled telemedicine application and verified that I am speaking with the correct person using two identifiers.   I discussed the limitations of evaluation and management by telemedicine. The patient expressed understanding and agreed to proceed.

## 2021-10-14 ENCOUNTER — Ambulatory Visit: Payer: Medicaid Other | Admitting: Obstetrics and Gynecology

## 2021-11-23 ENCOUNTER — Ambulatory Visit: Payer: Medicaid Other | Admitting: Obstetrics and Gynecology

## 2021-12-28 ENCOUNTER — Other Ambulatory Visit: Payer: Self-pay | Admitting: Family Medicine

## 2021-12-28 DIAGNOSIS — G44229 Chronic tension-type headache, not intractable: Secondary | ICD-10-CM

## 2022-01-03 NOTE — Telephone Encounter (Signed)
called to get PT rescheduled w/different provider since Jodi Mourning will be out of the office. PT WANTS TO WAIT FOR DR HARPER TO RETURN AND WILL SCHEDULE ANNUAL AT THAT TIME.

## 2022-01-04 ENCOUNTER — Encounter: Payer: Self-pay | Admitting: Family Medicine

## 2022-01-04 ENCOUNTER — Ambulatory Visit: Payer: Medicaid Other | Attending: Family Medicine | Admitting: Family Medicine

## 2022-01-04 VITALS — BP 112/80 | HR 92 | Wt 269.2 lb

## 2022-01-04 DIAGNOSIS — M62838 Other muscle spasm: Secondary | ICD-10-CM | POA: Insufficient documentation

## 2022-01-04 DIAGNOSIS — I10 Essential (primary) hypertension: Secondary | ICD-10-CM | POA: Diagnosis present

## 2022-01-04 DIAGNOSIS — K219 Gastro-esophageal reflux disease without esophagitis: Secondary | ICD-10-CM | POA: Insufficient documentation

## 2022-01-04 DIAGNOSIS — Z79899 Other long term (current) drug therapy: Secondary | ICD-10-CM | POA: Diagnosis not present

## 2022-01-04 DIAGNOSIS — G44229 Chronic tension-type headache, not intractable: Secondary | ICD-10-CM | POA: Diagnosis not present

## 2022-01-04 DIAGNOSIS — Z23 Encounter for immunization: Secondary | ICD-10-CM | POA: Diagnosis not present

## 2022-01-04 MED ORDER — HYDROCHLOROTHIAZIDE 25 MG PO TABS: ORAL_TABLET | ORAL | 1 refills | Status: DC

## 2022-01-04 MED ORDER — CYCLOBENZAPRINE HCL 10 MG PO TABS: 10.0000 mg | ORAL_TABLET | Freq: Two times a day (BID) | ORAL | 6 refills | Status: DC | PRN

## 2022-01-04 MED ORDER — OMEPRAZOLE 20 MG PO CPDR: DELAYED_RELEASE_CAPSULE | ORAL | 1 refills | Status: DC

## 2022-01-04 MED ORDER — TOPIRAMATE 50 MG PO TABS: 50.0000 mg | ORAL_TABLET | Freq: Two times a day (BID) | ORAL | 1 refills | Status: DC

## 2022-01-04 MED ORDER — LISINOPRIL 5 MG PO TABS: ORAL_TABLET | ORAL | 1 refills | Status: DC

## 2022-01-04 NOTE — Patient Instructions (Signed)

## 2022-01-04 NOTE — Progress Notes (Signed)
Subjective:  Patient ID: Kathryn Larson, female    DOB: 06/26/1970  Age: 51 y.o. MRN: [id number]  CC: No chief complaint on file.   HPI [name] is a 51 y.o. year old female with a history of hypertension, bipolar disorder, GERD   Interval History:  She has ben having a lot of 'heart burn' to the point that she has to throw up and then she has relief.Sauces, hamburger worsen her symptoms. She ran out of her Pirlosec but while she was on it symptoms were controlled.  She has lost weight as a result of cutting out several meals she would usually like. Denies presence of diarrhea, constipation but she does have intermittent abdominal bloating.  She endorses adherence with her antihypertensive. Migraines are managed on Topamax. She is currently under the care of psychiatry for her bipolar disorder and reports doing well from a behavioral health standpoint.  Past Medical History:  Diagnosis Date   Anxiety    Bipolar affective ([place])    Bipolar disorder, unspecified ([place]) 03/26/2013   Calculus of gallbladder with acute cholecystitis, without mention of obstruction 03/26/2013   Carpal tunnel syndrome    GERD (gastroesophageal reflux disease) 1994   GERD (gastroesophageal reflux disease) 03/26/2013   Obesity    Obesity, Class III, BMI 40-49.9 (morbid obesity) ([place]) 03/26/2013   Ovarian cyst    Panic attacks    Rectal bleed    Hemorrhoids   Seizures ([place])    last seizure in May 2015    Past Surgical History:  Procedure Laterality Date   ANKLE FRACTURE SURGERY     Bilateral - right 2013, left 2010   CHOLECYSTECTOMY N/A 03/24/2013   Procedure: LAPAROSCOPIC CHOLECYSTECTOMY;  Surgeon: [name], MD;  Location: [facility] OR;  Service: General;  Laterality: N/A;   ECTOPIC PREGNANCY SURGERY      Family History  Problem Relation Age of Onset   Diabetes Mother    Diabetes Sister    Cancer Maternal Grandmother     Social History   Socioeconomic History   Marital status: Legally  Separated    Spouse name: Not on file   Number of children: Not on file   Years of education: Not on file   Highest education level: Not on file  Occupational History   Occupation: disabled    Comment: mental health and ankles  Tobacco Use   Smoking status: Every Day    Packs/day: 0.25    Types: Cigarettes   Smokeless tobacco: Never  Vaping Use   Vaping Use: Never used  Substance and Sexual Activity   Alcohol use: Yes    Alcohol/week: 0.0 standard drinks of alcohol    Comment: occ.   Drug use: No   Sexual activity: Yes    Partners: Male    Birth control/protection: None  Other Topics Concern   Not on file  Social History Narrative   Not on file   Social Determinants of Health   Financial Resource Strain: Not on file  Food Insecurity: Not on file  Transportation Needs: Not on file  Physical Activity: Not on file  Stress: Not on file  Social Connections: Not on file    No Known Allergies  Outpatient Medications Prior to Visit  Medication Sig Dispense Refill   cetirizine (ZYRTEC) 10 MG tablet Take 1 tablet (10 mg total) by mouth daily. 30 tablet 1   clonazePAM (KLONOPIN) 0.5 MG tablet Take 0.5 mg by mouth 2 (two) times daily as needed.  divalproex (DEPAKOTE ER) 500 MG 24 hr tablet Take 500 mg by mouth 2 (two) times daily.     famotidine (PEPCID) 20 MG tablet Take 1 tablet (20 mg total) by mouth 2 (two) times daily. 30 tablet 0   ibuprofen (ADVIL) 800 MG tablet Take 1 tablet (800 mg total) by mouth every 12 (twelve) hours as needed. 60 tablet 5   prazosin (MINIPRESS) 5 MG capsule Take 5 mg by mouth daily.  0   ziprasidone (GEODON) 80 MG capsule Take 80 mg by mouth 2 (two) times daily.     cyclobenzaprine (FLEXERIL) 10 MG tablet Take 1 tablet (10 mg total) by mouth 2 (two) times daily as needed for muscle spasms. 60 tablet 6   ergocalciferol (DRISDOL) 1.25 MG (50000 UT) capsule Take 1 capsule (50,000 Units total) by mouth once a week. 12 capsule 1    hydrochlorothiazide (HYDRODIURIL) 25 MG tablet TAKE 1 TABLET(25 MG) BY MOUTH DAILY 90 tablet 1   lisinopril (ZESTRIL) 5 MG tablet TAKE 1 TABLET(5 MG) BY MOUTH DAILY 90 tablet 1   omeprazole (PRILOSEC) 20 MG capsule TAKE 1 CAPSULE(20 MG) BY MOUTH DAILY 30 capsule 0   topiramate (TOPAMAX) 50 MG tablet TAKE 1 TABLET(50 MG) BY MOUTH TWICE DAILY 180 tablet 0   escitalopram (LEXAPRO) 20 MG tablet Take 20 mg by mouth daily. (Patient not taking: Reported on 01/04/2022)  0   acetaminophen-codeine (TYLENOL #3) 300-30 MG tablet Take 1 tablet by mouth every 12 (twelve) hours as needed for moderate pain. (Patient not taking: Reported on 01/04/2022) 60 tablet 0   ALPRAZolam (XANAX) 1 MG tablet Take 1 mg by mouth 3 (three) times daily. (Patient not taking: Reported on 03/31/2020)     hydrOXYzine (VISTARIL) 50 MG capsule Take 1 capsule (50 mg total) by mouth every 8 (eight) hours as needed for anxiety or itching. (Patient not taking: Reported on 01/04/2022) 90 capsule 0   nicotine (NICODERM CQ) 14 mg/24hr patch Place 1 patch (14 mg total) onto the skin daily. (Patient not taking: Reported on 08/10/2021) 28 patch 1   sertraline (ZOLOFT) 100 MG tablet Take 100 mg by mouth 3 (three) times daily. (Patient not taking: Reported on 08/10/2021)     tinidazole (TINDAMAX) 500 MG tablet Take 2 tablets (1,000 mg total) by mouth daily with breakfast. (Patient not taking: Reported on 11/24/2020) 10 tablet 2   No facility-administered medications prior to visit.     ROS Review of Systems  Constitutional:  Negative for activity change and appetite change.  HENT:  Negative for sinus pressure and sore throat.   Respiratory:  Negative for chest tightness, shortness of breath and wheezing.   Cardiovascular:  Negative for chest pain and palpitations.  Gastrointestinal:  Positive for nausea and vomiting. Negative for abdominal distention, abdominal pain and constipation.  Genitourinary: Negative.   Musculoskeletal: Negative.    Psychiatric/Behavioral:  Negative for behavioral problems and dysphoric mood.     Objective:  BP 112/80   Pulse 92   Wt 269 lb 3.2 oz (122.1 kg)   SpO2 97%   BMI 46.21 kg/m      01/04/2022    3:53 PM 08/10/2021    3:14 PM 08/02/2021    8:45 PM  BP/Weight  Systolic BP 932 671 92  Diastolic BP 80 75 75  Wt. (Lbs) 269.2 277.4   BMI 46.21 kg/m2 47.62 kg/m2       Physical Exam Constitutional:      Appearance: She is well-developed.  Cardiovascular:  Rate and Rhythm: Normal rate.     Heart sounds: Normal heart sounds. No murmur heard. Pulmonary:     Effort: Pulmonary effort is normal.     Breath sounds: Normal breath sounds. No wheezing or rales.  Chest:     Chest wall: No tenderness.  Abdominal:     General: Bowel sounds are normal. There is no distension.     Palpations: Abdomen is soft. There is no mass.     Tenderness: There is no abdominal tenderness.  Musculoskeletal:        General: Normal range of motion.     Right lower leg: No edema.     Left lower leg: No edema.  Neurological:     Mental Status: She is alert and oriented to person, place, and time.  Psychiatric:        Mood and Affect: Mood normal.        Latest Ref Rng & Units 08/02/2021    4:15 PM 07/13/2021    9:45 AM 07/21/2020    4:09 PM  CMP  Glucose 70 - 99 mg/dL 92  86  103   BUN 6 - 20 mg/dL _0 Creatinine 0.44 - 1.00 mg/dL 0.97  0.94  1.01   Sodium 135 - 145 mmol/L 137  136  137   Potassium 3.5 - 5.1 mmol/L 3.7  3.9  4.0   Chloride 98 - 111 mmol/L 104  104  105   CO2 22 - 32 mmol/L _1 Calcium 8.9 - 10.3 mg/dL 9.2  9.0  9.3   Total Protein 6.5 - 8.1 g/dL 7.2  6.8    Total Bilirubin 0.3 - 1.2 mg/dL 0.7  0.3    Alkaline Phos 38 - 126 U/L 89  97    AST 15 - 41 U/L 75  61    ALT 0 - 44 U/L 67  55      Lipid Panel     Component Value Date/Time   CHOL 191 11/19/2015 1116   TRIG 95 11/19/2015 1116   HDL 40 (L) 11/19/2015 1116   CHOLHDL 4.8 11/19/2015 1116   VLDL 19  11/19/2015 1116   LDLCALC 132 (H) 11/19/2015 1116    CBC    Component Value Date/Time   WBC 8.2 08/02/2021 1615   RBC 4.62 08/02/2021 1615   HGB 14.4 08/02/2021 1615   HGB 13.7 07/13/2021 0945   HCT 43.2 08/02/2021 1615   HCT 39.9 07/13/2021 0945   PLT 339 08/02/2021 1615   PLT 332 07/13/2021 0945   MCV 93.5 08/02/2021 1615   MCV 91 07/13/2021 0945   MCH 31.2 08/02/2021 1615   MCHC 33.3 08/02/2021 1615   RDW 15.7 (H) 08/02/2021 1615   RDW 13.5 07/13/2021 0945   LYMPHSABS 3.1 08/02/2021 1615   LYMPHSABS 3.7 (H) 07/13/2021 0945   MONOABS 0.6 08/02/2021 1615   EOSABS 0.1 08/02/2021 1615   EOSABS 0.2 07/13/2021 0945   BASOSABS 0.0 08/02/2021 1615   BASOSABS 0.1 07/13/2021 0945    Lab Results  Component Value Date   HGBA1C 5.3 07/13/2021    Assessment & Plan:  1. Essential hypertension Controlled Continue current regimen Counseled on blood pressure goal of less than 130/80, low-sodium, DASH diet, medication compliance, 150 minutes of moderate intensity exercise per week. Discussed medication compliance, adverse effects. - hydrochlorothiazide (HYDRODIURIL) 25 MG tablet; TAKE 1 TABLET(25 MG) BY MOUTH DAILY  Dispense: 90 tablet; Refill: 1 -  lisinopril (ZESTRIL) 5 MG tablet; TAKE 1 TABLET(5 MG) BY MOUTH DAILY  Dispense: 90 tablet; Refill: 1 - CMP14+EGFR - LP+Non-HDL Cholesterol  2. Muscle spasm Stable - cyclobenzaprine (FLEXERIL) 10 MG tablet; Take 1 tablet (10 mg total) by mouth 2 (two) times daily as needed for muscle spasms.  Dispense: 60 tablet; Refill: 6  3. GERD without esophagitis Uncontrolled due to running out of PPI Advised to avoid recumbency up to 2 hours postmeal, avoid late meals, avoid foods that trigger symptoms. - omeprazole (PRILOSEC) 20 MG capsule; TAKE 1 CAPSULE(20 MG) BY MOUTH DAILY  Dispense: 90 capsule; Refill: 1 - H. pylori breath test  4. Chronic tension-type headache, not intractable Controlled - topiramate (TOPAMAX) 50 MG tablet; Take 1  tablet (50 mg total) by mouth 2 (two) times daily.  Dispense: 180 tablet; Refill: 1  5. Need for immunization against influenza - Flu Vaccine QUAD 71moIM (Fluarix, Fluzone & Alfiuria Quad PF)    Meds ordered this encounter  Medications   cyclobenzaprine (FLEXERIL) 10 MG tablet    Sig: Take 1 tablet (10 mg total) by mouth 2 (two) times daily as needed for muscle spasms.    Dispense:  60 tablet    Refill:  6   hydrochlorothiazide (HYDRODIURIL) 25 MG tablet    Sig: TAKE 1 TABLET(25 MG) BY MOUTH DAILY    Dispense:  90 tablet    Refill:  1   lisinopril (ZESTRIL) 5 MG tablet    Sig: TAKE 1 TABLET(5 MG) BY MOUTH DAILY    Dispense:  90 tablet    Refill:  1   omeprazole (PRILOSEC) 20 MG capsule    Sig: TAKE 1 CAPSULE(20 MG) BY MOUTH DAILY    Dispense:  90 capsule    Refill:  1    Must have office visit for refills   topiramate (TOPAMAX) 50 MG tablet    Sig: Take 1 tablet (50 mg total) by mouth 2 (two) times daily.    Dispense:  180 tablet    Refill:  1    Follow-up: Return in about 6 months (around 07/05/2022) for Chronic medical conditions.       ECharlott Rakes MD, FAAFP. CB[facility] [place]  01/04/2022, 4:38 PM

## 2022-01-05 ENCOUNTER — Other Ambulatory Visit: Payer: Self-pay | Admitting: Family Medicine

## 2022-01-05 DIAGNOSIS — R7989 Other specified abnormal findings of blood chemistry: Secondary | ICD-10-CM

## 2022-01-05 LAB — LP+NON-HDL CHOLESTEROL
Cholesterol, Total: 170 mg/dL (ref 100–199)
HDL: 49 mg/dL (ref >39)
LDL Chol Calc (NIH): 107 mg/dL — ABNORMAL HIGH (ref 0–99)
Total Non-HDL-Chol (LDL+VLDL): 121 mg/dL (ref 0–129)
Triglycerides: 72 mg/dL (ref 0–149)
VLDL Cholesterol Cal: 14 mg/dL (ref 5–40)

## 2022-01-05 LAB — CMP14+EGFR
ALT: 84 IU/L — ABNORMAL HIGH (ref 0–32)
AST: 82 IU/L — ABNORMAL HIGH (ref 0–40)
Albumin/Globulin Ratio: 1.4 (ref 1.2–2.2)
Albumin: 4.2 g/dL (ref 3.9–4.9)
Alkaline Phosphatase: 106 IU/L (ref 44–121)
BUN/Creatinine Ratio: 9 (ref 9–23)
BUN: 9 mg/dL (ref 6–24)
Bilirubin Total: 0.3 mg/dL (ref 0.0–1.2)
CO2: 23 mmol/L (ref 20–29)
Calcium: 9.8 mg/dL (ref 8.7–10.2)
Chloride: 101 mmol/L (ref 96–106)
Creatinine, Ser: 1 mg/dL (ref 0.57–1.00)
Globulin, Total: 2.9 g/dL (ref 1.5–4.5)
Glucose: 85 mg/dL (ref 70–99)
Potassium: 4.2 mmol/L (ref 3.5–5.2)
Sodium: 139 mmol/L (ref 134–144)
Total Protein: 7.1 g/dL (ref 6.0–8.5)
eGFR: 69 mL/min/{1.73_m2} (ref >59)

## 2022-01-06 LAB — H. PYLORI BREATH TEST: H pylori Breath Test: NEGATIVE

## 2022-01-07 ENCOUNTER — Ambulatory Visit (HOSPITAL_COMMUNITY): Admission: RE | Admit: 2022-01-07 | Payer: Medicaid Other | Source: Ambulatory Visit

## 2022-01-08 ENCOUNTER — Other Ambulatory Visit: Payer: Self-pay

## 2022-01-08 ENCOUNTER — Emergency Department (HOSPITAL_COMMUNITY)
Admission: EM | Admit: 2022-01-08 | Discharge: 2022-01-08 | Disposition: A | Discharge status: Home / Self Care | Payer: Medicaid Other | Attending: Emergency Medicine | Admitting: Emergency Medicine

## 2022-01-08 DIAGNOSIS — R799 Abnormal finding of blood chemistry, unspecified: Secondary | ICD-10-CM | POA: Diagnosis present

## 2022-01-08 DIAGNOSIS — R109 Unspecified abdominal pain: Secondary | ICD-10-CM | POA: Diagnosis not present

## 2022-01-08 DIAGNOSIS — R899 Unspecified abnormal finding in specimens from other organs, systems and tissues: Secondary | ICD-10-CM

## 2022-01-08 NOTE — Discharge Instructions (Signed)
Please follow up with your doctor for your scheduled ultrasound appointment.  Return to the ER for new or worsening symptoms.

## 2022-01-08 NOTE — ED Triage Notes (Signed)
Patient reports she went to pcp Wednesday and was contacted by pcp nurse yesterday and told to come to ED for evaluation of liver. Patient states "numbers were off"  Patient reports left side pain rated 7/10, says it has been going on a long time.

## 2022-01-08 NOTE — ED Provider Triage Note (Signed)
Emergency Medicine Provider Triage Evaluation Note  Kathryn Larson , a 51 y.o. female  was evaluated in triage.  Pt complains of abnormal blood tests. Complaining of pain in bilateral flanks, L> R for about 3 months. Her PCP thought it could be related to fatty tissue around her liver or her medications. She had routine blood work done at PCP and was called today that her liver numbers were "off" and she was to come to ER for eval. Hx of cholecystectomy. States pain aches like a toothache without aggravating or alleviating factors  Review of Systems  Positive: Abd pain, flank pain, intermittent diarrhea Negative: Fevers, chills, N/V, dysuria, hematuria  Physical Exam  BP 120/77 (BP Location: Right Arm)   Pulse 89   Temp 98.3 F (36.8 C) (Oral)   Resp 16   Ht 5\' 4"  (1.626 m)   Wt 127.9 kg   SpO2 98%   BMI 48.41 kg/m  Gen:   Awake, no distress   Resp:  Normal effort  MSK:   Moves extremities without difficulty  Other:    Medical Decision Making  Medically screening exam initiated at 1:21 PM.  Appropriate orders placed.  Kathryn Larson was informed that the remainder of the evaluation will be completed by another provider, this initial triage assessment does not replace that evaluation, and the importance of remaining in the ED until their evaluation is complete.  Workup initiated   Kathryn Larson T, PA-C 01/08/22 1324

## 2022-01-08 NOTE — ED Provider Notes (Signed)
[facility]-EMERGENCY DEPT Provider Note   CSN: [id number] Arrival date & time: 01/08/22  1247     History  Chief Complaint  Patient presents with   liver testing    Kathryn Larson is a 51 y.o. female with history of GERD, bipolar disorder, anxiety, seizures, who presents to the ER complaining of abnormal blood tests. Complaining of pain in bilateral flanks, L> R for about 3 months. Her PCP thought it could be related to fatty tissue around her liver or her medications. She had routine blood work done at PCP and was called today that her liver numbers were "off" and she was to come to ER for eval. Hx of cholecystectomy. States pain aches like a toothache without aggravating or alleviating factors.   Was supposed to have outpatient ultrasound yesterday but states she couldn't make it to the appointment. She thinks she was supposed to come in today to have it done. Does not have appointment information.   HPI     Home Medications Prior to Admission medications   Medication Sig Start Date End Date Taking? Authorizing Provider  cetirizine (ZYRTEC) 10 MG tablet Take 1 tablet (10 mg total) by mouth daily. 12/18/17   Hoy Register, MD  clonazePAM (KLONOPIN) 0.5 MG tablet Take 0.5 mg by mouth 2 (two) times daily as needed. 11/10/20   [provider]  cyclobenzaprine (FLEXERIL) 10 MG tablet Take 1 tablet (10 mg total) by mouth 2 (two) times daily as needed for muscle spasms. 01/04/22   Hoy Register, MD  divalproex (DEPAKOTE ER) 500 MG 24 hr tablet Take 500 mg by mouth 2 (two) times daily. 04/09/19   [provider]  escitalopram (LEXAPRO) 20 MG tablet Take 20 mg by mouth daily. Patient not taking: Reported on 01/04/2022 07/19/16   [provider]  famotidine (PEPCID) 20 MG tablet Take 1 tablet (20 mg total) by mouth 2 (two) times daily. 07/21/20   Wynetta Fines, MD  hydrochlorothiazide (HYDRODIURIL) 25 MG tablet TAKE 1 TABLET(25 MG) BY MOUTH  DAILY 01/04/22   Hoy Register, MD  ibuprofen (ADVIL) 800 MG tablet Take 1 tablet (800 mg total) by mouth every 12 (twelve) hours as needed. 06/29/21   Hoy Register, MD  lisinopril (ZESTRIL) 5 MG tablet TAKE 1 TABLET(5 MG) BY MOUTH DAILY 01/04/22   Hoy Register, MD  omeprazole (PRILOSEC) 20 MG capsule TAKE 1 CAPSULE(20 MG) BY MOUTH DAILY 01/04/22   Hoy Register, MD  prazosin (MINIPRESS) 5 MG capsule Take 5 mg by mouth daily. 07/19/16   [provider]  topiramate (TOPAMAX) 50 MG tablet Take 1 tablet (50 mg total) by mouth 2 (two) times daily. 01/04/22   Hoy Register, MD  ziprasidone (GEODON) 80 MG capsule Take 80 mg by mouth 2 (two) times daily. 04/15/19   [provider]      Allergies    Patient has no known allergies.    Review of Systems   Review of Systems  Gastrointestinal:  Positive for abdominal pain.  Genitourinary:  Positive for flank pain.  All other systems reviewed and are negative.   Physical Exam Updated Vital Signs BP 120/77 (BP Location: Right Arm)   Pulse 89   Temp 98.3 F (36.8 C) (Oral)   Resp 16   Ht 5\' 4"  (1.626 m)   Wt 127.9 kg   SpO2 98%   BMI 48.41 kg/m  Physical Exam Vitals and nursing note reviewed.  Constitutional:      Appearance: Normal appearance.  HENT:     Head: Normocephalic and atraumatic.  Eyes:     Conjunctiva/sclera: Conjunctivae normal.  Pulmonary:     Effort: Pulmonary effort is normal. No respiratory distress.  Skin:    General: Skin is warm and dry.  Neurological:     Mental Status: She is alert.  Psychiatric:        Mood and Affect: Mood normal.        Behavior: Behavior normal.     ED Results / Procedures / Treatments   Labs (all labs ordered are listed, but only abnormal results are displayed) Labs Reviewed - No data to display  EKG None  Radiology No results found.  Procedures Procedures    Medications Ordered in ED Medications - No data to display  ED Course/ Medical Decision  Making/ A&P                           Medical Decision Making  This patient is a 51 y.o. female who presents to the ED for concern of chronic abdominal discomfort, abnormal lab tests, needs abdominal US.   Past Medical History / Social History / Additional history: Chart reviewed. Pertinent results include: GERD, bipolar disorder, anxiety, seizures, who presents to the ER complaining of abnormal blood tests  Ultrasound ordered by PCP on 11/9 after LFT's. AST 82, LT 84.   Physical Exam: Physical exam performed. The pertinent findings include: Normal vitals, in no acute distress  Disposition: After consideration of the diagnostic results and the patients response to treatment, I feel that emergency department workup does not suggest an emergent condition requiring admission or immediate intervention beyond what has been performed at this time. The plan is: discharge with instructions to follow up with PCP about ultrasound appointment. Offered that she can stay and have it done here, but it would be several hours. Patient would like to leave and follow up. The patient is safe for discharge and has been instructed to return immediately for worsening symptoms, change in symptoms or any other concerns.  Final Clinical Impression(s) / ED Diagnoses Final diagnoses:  Abnormal laboratory test    Rx / DC Orders ED Discharge Orders     None      Portions of this report may have been transcribed using voice recognition software. Every effort was made to ensure accuracy; however, inadvertent computerized transcription errors may be present.    Jeanella Flattery 01/08/22 1352    Derwood Kaplan, MD 01/09/22 [phone]

## 2022-01-09 ENCOUNTER — Telehealth: Payer: Self-pay | Admitting: Emergency Medicine

## 2022-01-09 NOTE — Telephone Encounter (Signed)
Copied from CRM [id number]. Topic: General - Other >> Jan 09, 2022 11:40 AM Lyman Speller wrote: Reason for CRM: Pt went to the hospital for [place] and they advised her that no one from the office had called them to confirm pt needed an [place] / please advise asap / pt wasn't able to get [place] / pt needs an order and was advised to go to a place that does Ultra sounds

## 2022-01-09 NOTE — Telephone Encounter (Signed)
Pt has been informed of [place] appointment.

## 2022-01-16 ENCOUNTER — Ambulatory Visit (HOSPITAL_COMMUNITY)
Admission: RE | Admit: 2022-01-16 | Discharge: 2022-01-16 | Disposition: A | Discharge status: Home / Self Care | Payer: Medicaid Other | Source: Ambulatory Visit | Attending: Family Medicine | Admitting: Family Medicine

## 2022-01-16 DIAGNOSIS — R7989 Other specified abnormal findings of blood chemistry: Secondary | ICD-10-CM | POA: Diagnosis present

## 2022-02-08 ENCOUNTER — Encounter: Payer: Self-pay | Admitting: Obstetrics and Gynecology

## 2022-02-08 ENCOUNTER — Other Ambulatory Visit (HOSPITAL_COMMUNITY)
Admission: RE | Admit: 2022-02-08 | Discharge: 2022-02-08 | Disposition: A | Discharge status: Home / Self Care | Payer: Medicaid Other | Source: Ambulatory Visit | Attending: Obstetrics and Gynecology | Admitting: Obstetrics and Gynecology

## 2022-02-08 ENCOUNTER — Ambulatory Visit (INDEPENDENT_AMBULATORY_CARE_PROVIDER_SITE_OTHER): Payer: Medicaid Other | Admitting: Obstetrics and Gynecology

## 2022-02-08 VITALS — BP 119/84 | HR 96 | Wt 278.0 lb

## 2022-02-08 DIAGNOSIS — Z202 Contact with and (suspected) exposure to infections with a predominantly sexual mode of transmission: Secondary | ICD-10-CM | POA: Diagnosis not present

## 2022-02-08 DIAGNOSIS — Z113 Encounter for screening for infections with a predominantly sexual mode of transmission: Secondary | ICD-10-CM

## 2022-02-08 NOTE — Progress Notes (Signed)
Pt complains of some pelvic pain and vaginal odor.  Pt denies urinary symptoms.  Pt had Endo Bx this summer - has not had follow up for results.

## 2022-02-08 NOTE — Progress Notes (Signed)
Ms Schiavi presents for STD testing New sexual partner Vaginal discharge for 2 months now Does use various scented soaps  GYN U/S and EMBX results reviewed with pt  PE AF VSS Chaperone present  Lungs clear Heart RRR Abd soft + BS GU vaginal swab collected  A/P STD exposure  Vaginal swab and STD blood work today F/U per test results

## 2022-02-08 NOTE — Patient Instructions (Signed)

## 2022-02-09 LAB — CERVICOVAGINAL ANCILLARY ONLY
Bacterial Vaginitis (gardnerella): POSITIVE — AB
Candida Glabrata: NEGATIVE
Candida Vaginitis: NEGATIVE
Chlamydia: NEGATIVE
Comment: NEGATIVE
Comment: NEGATIVE
Comment: NEGATIVE
Comment: NEGATIVE
Comment: NEGATIVE
Comment: NORMAL
Neisseria Gonorrhea: NEGATIVE
Trichomonas: NEGATIVE

## 2022-02-09 LAB — HEPATITIS B SURFACE ANTIGEN: Hepatitis B Surface Ag: NEGATIVE

## 2022-02-09 LAB — HIV ANTIBODY (ROUTINE TESTING W REFLEX): HIV Screen 4th Generation wRfx: NONREACTIVE

## 2022-02-09 LAB — HEPATITIS C ANTIBODY: Hep C Virus Ab: NONREACTIVE

## 2022-02-09 LAB — RPR: RPR Ser Ql: NONREACTIVE

## 2022-02-10 ENCOUNTER — Other Ambulatory Visit: Payer: Self-pay | Admitting: Emergency Medicine

## 2022-02-10 MED ORDER — METRONIDAZOLE 500 MG PO TABS: 500.0000 mg | ORAL_TABLET | Freq: Two times a day (BID) | ORAL | 0 refills | Status: DC

## 2022-02-10 NOTE — Progress Notes (Signed)
Rx for BV 

## 2022-04-17 ENCOUNTER — Emergency Department (HOSPITAL_COMMUNITY): Payer: Medicaid Other

## 2022-04-17 ENCOUNTER — Ambulatory Visit: Payer: Self-pay | Admitting: *Deleted

## 2022-04-17 ENCOUNTER — Emergency Department (HOSPITAL_COMMUNITY)
Admission: EM | Admit: 2022-04-17 | Discharge: 2022-04-17 | Discharge status: Against Medical Advice | Payer: Medicaid Other | Attending: Emergency Medicine | Admitting: Emergency Medicine

## 2022-04-17 ENCOUNTER — Encounter: Payer: Self-pay | Admitting: Gastroenterology

## 2022-04-17 ENCOUNTER — Other Ambulatory Visit: Payer: Self-pay

## 2022-04-17 ENCOUNTER — Encounter (HOSPITAL_COMMUNITY): Payer: Self-pay

## 2022-04-17 DIAGNOSIS — R1084 Generalized abdominal pain: Secondary | ICD-10-CM | POA: Diagnosis not present

## 2022-04-17 DIAGNOSIS — Z79899 Other long term (current) drug therapy: Secondary | ICD-10-CM | POA: Diagnosis not present

## 2022-04-17 DIAGNOSIS — I10 Essential (primary) hypertension: Secondary | ICD-10-CM | POA: Diagnosis not present

## 2022-04-17 DIAGNOSIS — R55 Syncope and collapse: Secondary | ICD-10-CM | POA: Insufficient documentation

## 2022-04-17 DIAGNOSIS — K625 Hemorrhage of anus and rectum: Secondary | ICD-10-CM | POA: Diagnosis present

## 2022-04-17 LAB — CBC
HCT: 43.4 % (ref 36.0–46.0)
Hemoglobin: 14.6 g/dL (ref 12.0–15.0)
MCH: 32.1 pg (ref 26.0–34.0)
MCHC: 33.6 g/dL (ref 30.0–36.0)
MCV: 95.4 fL (ref 80.0–100.0)
Platelets: 352 10*3/uL (ref 150–400)
RBC: 4.55 MIL/uL (ref 3.87–5.11)
RDW: 13.5 % (ref 11.5–15.5)
WBC: 8.9 10*3/uL (ref 4.0–10.5)
nRBC: 0 % (ref 0.0–0.2)

## 2022-04-17 LAB — TYPE AND SCREEN
ABO/RH(D): B POS
Antibody Screen: NEGATIVE

## 2022-04-17 LAB — COMPREHENSIVE METABOLIC PANEL
ALT: 30 U/L (ref 0–44)
AST: 29 U/L (ref 15–41)
Albumin: 3.5 g/dL (ref 3.5–5.0)
Alkaline Phosphatase: 81 U/L (ref 38–126)
Anion gap: 11 (ref 5–15)
BUN: 9 mg/dL (ref 6–20)
CO2: 23 mmol/L (ref 22–32)
Calcium: 8.7 mg/dL — ABNORMAL LOW (ref 8.9–10.3)
Chloride: 103 mmol/L (ref 98–111)
Creatinine, Ser: 1.02 mg/dL — ABNORMAL HIGH (ref 0.44–1.00)
GFR, Estimated: >60 mL/min (ref >60)
Glucose, Bld: 93 mg/dL (ref 70–99)
Potassium: 3.5 mmol/L (ref 3.5–5.1)
Sodium: 137 mmol/L (ref 135–145)
Total Bilirubin: 0.6 mg/dL (ref 0.3–1.2)
Total Protein: 7.1 g/dL (ref 6.5–8.1)

## 2022-04-17 LAB — HEMOGLOBIN AND HEMATOCRIT, BLOOD
HCT: 44.1 % (ref 36.0–46.0)
Hemoglobin: 13.7 g/dL (ref 12.0–15.0)

## 2022-04-17 LAB — CBG MONITORING, ED: Glucose-Capillary: 84 mg/dL (ref 70–99)

## 2022-04-17 LAB — POC OCCULT BLOOD, ED: Fecal Occult Bld: POSITIVE — AB

## 2022-04-17 MED ORDER — IOHEXOL 300 MG/ML  SOLN
100.0000 mL | Freq: Once | INTRAMUSCULAR | Status: AC | PRN
  Administered 2022-04-17: 100 mL via INTRAVENOUS

## 2022-04-17 MED ORDER — DICYCLOMINE HCL 10 MG PO CAPS
10.0000 mg | ORAL_CAPSULE | Freq: Once | ORAL | Status: AC
  Administered 2022-04-17: 10 mg via ORAL
  Filled 2022-04-17: qty 1

## 2022-04-17 MED ORDER — LACTATED RINGERS IV BOLUS
1000.0000 mL | Freq: Once | INTRAVENOUS | Status: AC
  Administered 2022-04-17: 1000 mL via INTRAVENOUS

## 2022-04-17 MED ORDER — ACETAMINOPHEN 500 MG PO TABS
1000.0000 mg | ORAL_TABLET | Freq: Once | ORAL | Status: AC
  Administered 2022-04-17: 1000 mg via ORAL
  Filled 2022-04-17: qty 2

## 2022-04-17 NOTE — ED Notes (Signed)
Rectal exam done with md Mayra Neer

## 2022-04-17 NOTE — ED Provider Notes (Signed)
[facility] Provider Note   CSN: [id number] Arrival date & time: 04/17/22  1545     History  Chief Complaint  Patient presents with   Rectal Bleeding    Kathryn Larson is a 52 y.o. female with GERD, bipolar disorder, obesity, HTN, frequent falls, seizures, headaches, chronic LBP who presents with rectal bleeding.   Patient said she began having dark bloody stools today along with blood clots x3 occurences. No blood thinners. Abdominal pain all over her stomach and rectum. Patient states she has never had surgery on her abdomen. Denies urinary symptoms, vaginal symptoms. No nausea/vomiting. No h/o similar. She endorses lightheadedness.    Rectal Bleeding      Home Medications Prior to Admission medications   Medication Sig Start Date End Date Taking? Authorizing Provider  metroNIDAZOLE (FLAGYL) 500 MG tablet Take 1 tablet (500 mg total) by mouth 2 (two) times daily. 02/10/22   Inez Catalina, MD  cetirizine (ZYRTEC) 10 MG tablet Take 1 tablet (10 mg total) by mouth daily. 12/18/17   Charlott Rakes, MD  clonazePAM (KLONOPIN) 0.5 MG tablet Take 0.5 mg by mouth 2 (two) times daily as needed. 11/10/20   [provider]  cyclobenzaprine (FLEXERIL) 10 MG tablet Take 1 tablet (10 mg total) by mouth 2 (two) times daily as needed for muscle spasms. 01/04/22   Charlott Rakes, MD  divalproex (DEPAKOTE ER) 500 MG 24 hr tablet Take 500 mg by mouth 2 (two) times daily. 04/09/19   [provider]  escitalopram (LEXAPRO) 20 MG tablet Take 20 mg by mouth daily. 07/19/16   [provider]  famotidine (PEPCID) 20 MG tablet Take 1 tablet (20 mg total) by mouth 2 (two) times daily. 07/21/20   Valarie Merino, MD  hydrochlorothiazide (HYDRODIURIL) 25 MG tablet TAKE 1 TABLET(25 MG) BY MOUTH DAILY 01/04/22   Charlott Rakes, MD  ibuprofen (ADVIL) 800 MG tablet Take 1 tablet (800 mg total) by mouth every 12 (twelve) hours as  needed. 06/29/21   Charlott Rakes, MD  lisinopril (ZESTRIL) 5 MG tablet TAKE 1 TABLET(5 MG) BY MOUTH DAILY 01/04/22   Charlott Rakes, MD  omeprazole (PRILOSEC) 20 MG capsule TAKE 1 CAPSULE(20 MG) BY MOUTH DAILY 01/04/22   Charlott Rakes, MD  prazosin (MINIPRESS) 5 MG capsule Take 5 mg by mouth daily. 07/19/16   [provider]  topiramate (TOPAMAX) 50 MG tablet Take 1 tablet (50 mg total) by mouth 2 (two) times daily. 01/04/22   Charlott Rakes, MD  ziprasidone (GEODON) 80 MG capsule Take 80 mg by mouth 2 (two) times daily. 04/15/19   [provider]      Allergies    Patient has no known allergies.    Review of Systems   Review of Systems  Gastrointestinal:  Positive for hematochezia.   Review of systems Negative for f/c.  A 10 point review of systems was performed and is negative unless otherwise reported in HPI.  Physical Exam Updated Vital Signs BP 129/85   Pulse 70   Temp 98.3 F (36.8 C) (Oral)   Resp 19   Ht 5' 4"$  (1.626 m)   Wt 118.8 kg   SpO2 100%   BMI 44.97 kg/m  Physical Exam General: Normal appearing female, lying in bed.  HEENT: Sclera anicteric, MMM, trachea midline.  Cardiology: RRR, no murmurs/rubs/gallops. BL radial and DP pulses equal bilaterally.  Resp: Normal respiratory rate and effort. CTAB, no wheezes, rhonchi, crackles.  Abd: Diffuse mild  TTP. Soft, non-distended. No rebound tenderness or guarding.  Rectal: No external hemorrhoids. Normal appearing external anus. No palpable internal hemorrhoids. Small amount BRB on glove.  MSK: No peripheral edema or signs of trauma. Extremities without deformity or TTP. No cyanosis or clubbing. Skin: warm, dry. No rashes or lesions. Back: No CVA tenderness Neuro: A&Ox4, CNs II-XII grossly intact. MAEs. Sensation grossly intact.  Psych: Normal mood and affect.   ED Results / Procedures / Treatments   Labs (all labs ordered are listed, but only abnormal results are displayed) Labs Reviewed   COMPREHENSIVE METABOLIC PANEL - Abnormal; Notable for the following components:      Result Value   Creatinine, Ser 1.02 (*)    Calcium 8.7 (*)    All other components within normal limits  POC OCCULT BLOOD, ED - Abnormal; Notable for the following components:   Fecal Occult Bld POSITIVE (*)    All other components within normal limits  CBC  HEMOGLOBIN AND HEMATOCRIT, BLOOD  CBG MONITORING, ED  TYPE AND SCREEN    EKG None  Radiology CT ABDOMEN PELVIS W CONTRAST  Result Date: 04/17/2022 CLINICAL DATA:  Abdominal pain. EXAM: CT ABDOMEN AND PELVIS WITH CONTRAST TECHNIQUE: Multidetector CT imaging of the abdomen and pelvis was performed using the standard protocol following bolus administration of intravenous contrast. RADIATION DOSE REDUCTION: This exam was performed according to the departmental dose-optimization program which includes automated exposure control, adjustment of the mA and/or kV according to patient size and/or use of iterative reconstruction technique. CONTRAST:  137m OMNIPAQUE IOHEXOL 300 MG/ML  SOLN COMPARISON:  CT abdomen pelvis dated 06/20/2016. FINDINGS: Lower chest: The visualized lung bases are clear. No intra-abdominal free air or free fluid. Hepatobiliary: Fatty liver. No biliary ductal dilatation. Cholecystectomy. No retained calcified stone noted in the central CBD. Pancreas: Unremarkable. No pancreatic ductal dilatation or surrounding inflammatory changes. Spleen: Normal in size without focal abnormality. Adrenals/Urinary Tract: Indeterminate bilateral adrenal nodules measuring up to 16 mm on the right. The left adrenal nodule has increased since the prior CT of 2018. These are not characterized on this CT but likely represent adenoma given relative stability. Further characterization with MRI on a nonemergent/outpatient basis recommended. A subcentimeter left renal inferior pole hypodense focus is too small to characterize. No imaging follow-up. Punctate left renal  upper pole nonobstructing stone. There is no hydronephrosis on either side. The visualized ureters and urinary bladder appear unremarkable. Stomach/Bowel: There is no bowel obstruction or active inflammation. The appendix is normal. Vascular/Lymphatic: Mild aortoiliac atherosclerotic disease. The IVC is unremarkable. No portal venous gas. There is no adenopathy. Reproductive: The uterus is anteverted and grossly unremarkable. No adnexal masses. Other: Small ventral supraumbilical fat containing hernia. Musculoskeletal: Lower lumbar facet arthropathy. No acute osseous pathology. IMPRESSION: 1. No acute intra-abdominal or pelvic pathology. 2. Fatty liver. 3. Punctate left renal upper pole nonobstructing stone. No hydronephrosis. 4. Indeterminate bilateral adrenal nodules. Further characterization with MRI on a nonemergent/outpatient basis recommended. 5.  Aortic Atherosclerosis (ICD10-I70.0). Electronically Signed   By: AAnner CreteM.D.   On: 04/17/2022 20:05    Procedures Procedures    Medications Ordered in ED Medications  lactated ringers bolus 1,000 mL (0 mLs Intravenous Stopped 04/17/22 1904)  iohexol (OMNIPAQUE) 300 MG/ML solution 100 mL (100 mLs Intravenous Contrast Given 04/17/22 1941)  acetaminophen (TYLENOL) tablet 1,000 mg (1,000 mg Oral Given 04/17/22 2104)  dicyclomine (BENTYL) capsule 10 mg (10 mg Oral Given 04/17/22 2104)    ED Course/ Medical Decision Making/ A&P  Medical Decision Making Amount and/or Complexity of Data Reviewed Labs: ordered. Decision-making details documented in ED Course. Radiology: ordered. Decision-making details documented in ED Course.  Risk OTC drugs. Prescription drug management.    This patient presents to the ED for concern of rectal bleeding and abdominal pain, this involves an extensive number of treatment options, and is a complaint that carries with it a high risk of complications and morbidity.  I considered the  following differential and admission for this acute, potentially life threatening condition.   MDM:    Based on clinical presentation, hematochezia reported and clinical exam history and findings, most concerned about LGIB.   DDx for LGIB includes but is not limited to:  No external hemorrhoids or anal fissure noted on exam. No palpable rectal foreign body. Consider diverticulitis/diverticulosis, ischemic colitis, IBD, AVMs or Dieulafoy lesions, infectious colitis, malignancy. Patient did syncopize while walking in the ED< consider acute blood loss anemia but Hgb is 14.6. Will repeat as possible it has not equilibrated yet. Will fluid resuscitate with 1L IVF so will expect some decrease on repeat H&H. No e/o coagulopathy or thrombocytopenia. Will do CT abd pelvis. Patient not hypotensive, not tachycardic, no c/f hemorrhagic shock.    Clinical Course as of 04/17/22 2202  Mon Apr 17, 2022  1656 Hemoglobin: 14.6 [HN]  1656 WBC: 8.9 [HN]  1656 Comprehensive metabolic panel(!) Unremarkable [HN]  1656 While walking in the ED with nursing, patient became lightheaded and fell backwards onto the floor syncopized. Witnessed fall. She hit the nurse before hitting the ground, did not hit her head. No seizure activity. [HN]  1713 Glucose-Capillary: 84 [HN]  1910 Hemoglobin: 13.7 Dropped <1 point with also IVF [HN]  1947 Fecal Occult Blood, POC(!): POSITIVE [HN]  1947 Very small amount BRB on glove after DRE. No external hemorrhoids noted, no internal hemorrhoids palpated but patient states DRE was painful. [HN]  1947 Pulse Rate: 70 [HN]  1948 BP: 129/85 VSS [HN]  2012 CT ABDOMEN PELVIS W CONTRAST 1. No acute intra-abdominal or pelvic pathology. 2. Fatty liver. 3. Punctate left renal upper pole nonobstructing stone. No hydronephrosis. 4. Indeterminate bilateral adrenal nodules. Further characterization with MRI on a nonemergent/outpatient basis recommended. 5.  Aortic Atherosclerosis  (ICD10-I70.0).   [HN]  2154 Gave patient tylenol and bentyl for abdominal pain. CT was negative. Patient and her son walked out of ED prior to results being given.  [HN]    Clinical Course User Index [HN] Audley Hose, MD    Labs: I Ordered, and personally interpreted labs.  The pertinent results include:  those lsited above  Imaging Studies ordered: I ordered imaging studies including CT abd pelvis I independently visualized and interpreted imaging. I agree with the radiologist interpretation  Additional history obtained from son at bedside, chart review.    Cardiac Monitoring: The patient was maintained on a cardiac monitor.  I personally viewed and interpreted the cardiac monitored which showed an underlying rhythm of: NSR  Social Determinants of Health: Patient lives independently   Disposition:  Eloped from ED prior to results being given or prior to reevaluation    Co morbidities that complicate the patient evaluation  Past Medical History:  Diagnosis Date   Anxiety    Bipolar affective ([place])    Bipolar disorder, unspecified ([place]) 03/26/2013   Calculus of gallbladder with acute cholecystitis, without mention of obstruction 03/26/2013   Carpal tunnel syndrome    GERD (gastroesophageal reflux disease) 1994   GERD (gastroesophageal reflux disease) 03/26/2013  Obesity    Obesity, Class III, BMI 40-49.9 (morbid obesity) ([place]) 03/26/2013   Ovarian cyst    Panic attacks    Rectal bleed    Hemorrhoids   Seizures ([place])    last seizure in May 2015     Medicines Meds ordered this encounter  Medications   lactated ringers bolus 1,000 mL   iohexol (OMNIPAQUE) 300 MG/ML solution 100 mL   acetaminophen (TYLENOL) tablet 1,000 mg   dicyclomine (BENTYL) capsule 10 mg    I have reviewed the patients home medicines and have made adjustments as needed  Problem List / ED Course: Problem List Items Addressed This Visit   None Visit Diagnoses     Rectal bleeding    -   Primary   Generalized abdominal pain       Syncope, unspecified syncope type                       This note was created using dictation software, which may contain spelling or grammatical errors.    Audley Hose, MD 04/17/22 2202

## 2022-04-17 NOTE — ED Notes (Signed)
Pt seen leaving department.

## 2022-04-17 NOTE — ED Triage Notes (Addendum)
Patient said she began having dark bloody stools today along with blood clots x3 occurences. No blood thinners. Abdominal pain all over her stomach and rectum. No dizziness.

## 2022-04-17 NOTE — Telephone Encounter (Signed)
  Chief Complaint: rectal bleeding  Symptoms: blood noted in stool this am , dizziness. Dizziness gone now but noted blood clots with wiping and blood in commode.  Frequency: today  Pertinent Negatives: Patient denies dizziness now  Disposition: [x]$ ED /[]$ Urgent Care (no appt availability in office) / []$ Appointment(In office/virtual)/ []$  [facility]/ []$ Home Care/ []$ Refused Recommended Disposition /[]$ [facility]/ []$  Follow-up with PCP Additional Notes:   Instructed patient to go to ED now.     Reason for Disposition  [1] MODERATE rectal bleeding (small blood clots, passing blood without stool, or toilet water turns red) AND [2] more than once a day  Answer Assessment - Initial Assessment Questions 1. APPEARANCE of BLOOD: "What color is it?" "Is it passed separately, on the surface of the stool, or mixed in with the stool?"      Blood in stool this am , and noted clots when wiping  2. AMOUNT: "How much blood was passed?"      na 3. FREQUENCY: "How many times has blood been passed with the stools?"      na 4. ONSET: "When was the blood first seen in the stools?" (Days or weeks)      This am  5. DIARRHEA: "Is there also some diarrhea?" If Yes, ask: "How many diarrhea stools in the past 24 hours?"      na 6. CONSTIPATION: "Do you have constipation?" If Yes, ask: "How bad is it?"     na 7. RECURRENT SYMPTOMS: "Have you had blood in your stools before?" If Yes, ask: "When was the last time?" and "What happened that time?"      na 8. BLOOD THINNERS: "Do you take any blood thinners?" (e.g., Coumadin/warfarin, Pradaxa/dabigatran, aspirin)     Na  9. OTHER SYMPTOMS: "Do you have any other symptoms?"  (e.g., abdomen pain, vomiting, dizziness, fever)     Dizziness this am , not now. Wiping and noted blood clots , commode water bloody 10. PREGNANCY: "Is there any chance you are pregnant?" "When was your last menstrual period?"       na  Protocols used: Rectal  Bleeding-A-AH

## 2022-04-17 NOTE — ED Notes (Addendum)
Patient was ambulating from triage to room 9 and began starting to lose her balance. Patient had an assisted fall to the floor. Vernie Shanks, PA at bedside. Patient became alert after 2 minutes.

## 2022-04-18 NOTE — Telephone Encounter (Signed)
Patient seen in ED. Eloped  before full assessment completed.

## 2022-04-27 ENCOUNTER — Telehealth: Payer: Self-pay

## 2022-04-27 ENCOUNTER — Ambulatory Visit (AMBULATORY_SURGERY_CENTER): Payer: Medicaid Other

## 2022-04-27 VITALS — Ht 64.0 in | Wt 263.0 lb

## 2022-04-27 DIAGNOSIS — Z1211 Encounter for screening for malignant neoplasm of colon: Secondary | ICD-10-CM

## 2022-04-27 DIAGNOSIS — K625 Hemorrhage of anus and rectum: Secondary | ICD-10-CM

## 2022-04-27 MED ORDER — PEG 3350-KCL-NA BICARB-NACL 420 G PO SOLR: 4000.0000 mL | Freq: Once | ORAL | 0 refills | Status: AC

## 2022-04-27 MED ORDER — NA SULFATE-K SULFATE-MG SULF 17.5-3.13-1.6 GM/177ML PO SOLN: 1.0000 | Freq: Once | ORAL | 0 refills | Status: AC

## 2022-04-27 NOTE — Progress Notes (Signed)
No egg or soy allergy known to patient   No issues known to pt with past sedation with any surgeries or procedures  Patient denies ever being told they had issues or difficulty with intubation   No FH of Malignant Hyperthermia  Pt is not on diet pills  Pt is not on  home 02   Pt is not on blood thinners   Pt denies issues with constipation   No A fib or A flutter  Have any cardiac testing pending--no Pt instructed to use Single[employer][email or web address] or GoodRx for a price reduction on prep   

## 2022-04-28 NOTE — Telephone Encounter (Signed)
Thank you for the note.  I see that her hemoglobin was normal during ED visit for rectal bleeding on 04/17/2022.  Proceed as scheduled with colonoscopy.  - HD

## 2022-05-05 ENCOUNTER — Other Ambulatory Visit: Payer: Self-pay | Admitting: Family Medicine

## 2022-05-05 DIAGNOSIS — K219 Gastro-esophageal reflux disease without esophagitis: Secondary | ICD-10-CM

## 2022-05-24 ENCOUNTER — Ambulatory Visit (AMBULATORY_SURGERY_CENTER): Payer: Medicaid Other | Admitting: Gastroenterology

## 2022-05-24 ENCOUNTER — Encounter: Payer: Self-pay | Admitting: Gastroenterology

## 2022-05-24 VITALS — BP 127/85 | HR 72 | Temp 97.7°F | Resp 13 | Ht 64.0 in | Wt 270.6 lb

## 2022-05-24 DIAGNOSIS — Z1211 Encounter for screening for malignant neoplasm of colon: Secondary | ICD-10-CM | POA: Diagnosis not present

## 2022-05-24 DIAGNOSIS — D124 Benign neoplasm of descending colon: Secondary | ICD-10-CM

## 2022-05-24 DIAGNOSIS — D125 Benign neoplasm of sigmoid colon: Secondary | ICD-10-CM

## 2022-05-24 DIAGNOSIS — D123 Benign neoplasm of transverse colon: Secondary | ICD-10-CM

## 2022-05-24 MED ORDER — SODIUM CHLORIDE 0.9 % IV SOLN: 500.0000 mL | Freq: Once | INTRAVENOUS | Status: DC

## 2022-05-24 NOTE — Progress Notes (Signed)
Pt's states no medical or surgical changes since previsit or office visit. 

## 2022-05-24 NOTE — Progress Notes (Signed)
Called to room to assist during endoscopic procedure.  Patient ID and intended procedure confirmed with present staff. Received instructions for my participation in the procedure from the performing physician.  

## 2022-05-24 NOTE — Op Note (Signed)
[facility] Patient Name: Kathryn Larson Procedure Date: 05/24/2022 1:19 PM MRN: [id number] Endoscopist: [place]. Loletha Carrow , MD, [id number] Age: 52 Referring MD:  Date of Birth: 1971/02/11 Gender: Female Account #: [id number] Procedure:                Colonoscopy Indications:              Screening for colorectal malignant neoplasm, This                            is the patient's first colonoscopy Medicines:                Monitored Anesthesia Care Procedure:                Pre-Anesthesia Assessment:                           - Prior to the procedure, a History and Physical                            was performed, and patient medications and                            allergies were reviewed. The patient's tolerance of                            previous anesthesia was also reviewed. The risks                            and benefits of the procedure and the sedation                            options and risks were discussed with the patient.                            All questions were answered, and informed consent                            was obtained. Prior Anticoagulants: The patient has                            taken no anticoagulant or antiplatelet agents. ASA                            Grade Assessment: III - A patient with severe                            systemic disease. After reviewing the risks and                            benefits, the patient was deemed in satisfactory                            condition to undergo the procedure.  After obtaining informed consent, the colonoscope                            was passed under direct vision. Throughout the                            procedure, the patient's blood pressure, pulse, and                            oxygen saturations were monitored continuously. The                            Olympus [id number]) Colonoscope was                            introduced through the  anus and advanced to the the                            cecum, identified by appendiceal orifice and                            ileocecal valve. The colonoscopy was somewhat                            difficult due to a redundant colon, significant                            looping and the patient's body habitus. Successful                            completion of the procedure was aided by using                            manual pressure and straightening and shortening                            the scope to obtain bowel loop reduction. The                            patient tolerated the procedure well. The quality                            of the bowel preparation was excellent. The                            ileocecal valve, appendiceal orifice, and rectum                            were photographed. Scope In: 1:25:14 PM Scope Out: 1:42:35 PM Scope Withdrawal Time: 0 hours 14 minutes 39 seconds  Total Procedure Duration: 0 hours 17 minutes 21 seconds  Findings:                 The perianal and digital rectal examinations were  normal.                           Repeat examination of right colon under NBI                            performed.                           Four sessile polyps were found in the sigmoid                            colon, descending colon and transverse colon. The                            polyps were diminutive in size. These polyps were                            removed with a cold snare. Resection and retrieval                            were complete.                           The sigmoid colon was redundant.                           Internal hemorrhoids were found. The hemorrhoids                            were small.                           The exam was otherwise without abnormality on                            direct and retroflexion views. Complications:            No immediate complications. Estimated Blood Loss:      Estimated blood loss was minimal. Impression:               - Four diminutive polyps in the sigmoid colon, in                            the descending colon and in the transverse colon,                            removed with a cold snare. Resected and retrieved.                           - Redundant colon.                           - Internal hemorrhoids.                           - The examination was otherwise normal on direct  and retroflexion views.                           - The GI Genius (intelligent endoscopy module),                            computer-aided polyp detection system powered by AI                            was utilized to detect colorectal polyps through                            enhanced visualization during colonoscopy. Recommendation:           - Patient has a contact number available for                            emergencies. The signs and symptoms of potential                            delayed complications were discussed with the                            patient. Return to normal activities tomorrow.                            Written discharge instructions were provided to the                            patient.                           - Resume previous diet.                           - Continue present medications.                           - Await pathology results.                           - Repeat colonoscopy is recommended for                            surveillance. The colonoscopy date will be                            determined after pathology results from today's                            exam become available for review.                           - Dietary fiber, water intake and physical activity                            to help maintain  bowel regularity.                           - Preparation H suppository: Insert rectally as                            necessary for hemorrhoidal bleeding.                            - Return to my office PRN. Brendaly Townsel L. Loletha Carrow, MD 05/24/2022 1:48:33 PM This report has been signed electronically.

## 2022-05-24 NOTE — Patient Instructions (Signed)
Handout on hemorrhoids and polyps given to patient.  Await pathology results. Resume previous diet and continue present medications.  Repeat colonoscopy for surveillance will be determined based off of pathology results. Preparation H suppository - Insert rectally as necessary for hemorrhoidal bleeding (can purchase this over the counter) Return to GI office as needed  YOU HAD AN ENDOSCOPIC PROCEDURE TODAY AT [facility]:   Refer to the procedure report that was given to you for any specific questions about what was found during the examination.  If the procedure report does not answer your questions, please call your gastroenterologist to clarify.  If you requested that your care partner not be given the details of your procedure findings, then the procedure report has been included in a sealed envelope for you to review at your convenience later.  YOU SHOULD EXPECT: Some feelings of bloating in the abdomen. Passage of more gas than usual.  Walking can help get rid of the air that was put into your GI tract during the procedure and reduce the bloating. If you had a lower endoscopy (such as a colonoscopy or flexible sigmoidoscopy) you may notice spotting of blood in your stool or on the toilet paper. If you underwent a bowel prep for your procedure, you may not have a normal bowel movement for a few days.  Please Note:  You might notice some irritation and congestion in your nose or some drainage.  This is from the oxygen used during your procedure.  There is no need for concern and it should clear up in a day or so.  SYMPTOMS TO REPORT IMMEDIATELY:   Following upper endoscopy (EGD)  Vomiting of blood or coffee ground material  New chest pain or pain under the shoulder blades  Painful or persistently difficult swallowing  New shortness of breath  Fever of 100F or higher  Black, tarry-looking stools  For urgent or emergent issues, a gastroenterologist can be reached at any  hour by calling [phone]. Do not use MyChart messaging for urgent concerns.    DIET:  We do recommend a small meal at first, but then you may proceed to your regular diet.  Drink plenty of fluids but you should avoid alcoholic beverages for 24 hours.  ACTIVITY:  You should plan to take it easy for the rest of today and you should NOT DRIVE or use heavy machinery until tomorrow (because of the sedation medicines used during the test).    FOLLOW UP: Our staff will call the number listed on your records the next business day following your procedure.  We will call around 7:15- 8:00 am to check on you and address any questions or concerns that you may have regarding the information given to you following your procedure. If we do not reach you, we will leave a message.     If any biopsies were taken you will be contacted by phone or by letter within the next 1-3 weeks.  Please call us at [phone] if you have not heard about the biopsies in 3 weeks.    SIGNATURES/CONFIDENTIALITY: You and/or your care partner have signed paperwork which will be entered into your electronic medical record.  These signatures attest to the fact that that the information above on your After Visit Summary has been reviewed and is understood.  Full responsibility of the confidentiality of this discharge information lies with you and/or your care-partner.

## 2022-05-24 NOTE — Progress Notes (Signed)
A and O x3. Report to RN. Tolerated MAC anesthesia well. 

## 2022-05-24 NOTE — Progress Notes (Signed)
History and Physical:  This patient presents for endoscopic testing for: Encounter Diagnosis  Name Primary?   Special screening for malignant neoplasms, colon Yes    Average risk for colorectal cancer.  First screening exam. Had an episode of rectal bleeding leading to ED visit 04/17/22 - normal Hgb  Patient is otherwise without complaints or active issues today.   Past Medical History: Past Medical History:  Diagnosis Date   Anxiety    Bipolar affective ([place])    Bipolar disorder, unspecified ([place]) 03/26/2013   Calculus of gallbladder with acute cholecystitis, without mention of obstruction 03/26/2013   Carpal tunnel syndrome    Cataract    GERD (gastroesophageal reflux disease) 02/28/1992   GERD (gastroesophageal reflux disease) 03/26/2013   Hyperlipidemia    Hypertension    Obesity    Obesity, Class III, BMI 40-49.9 (morbid obesity) ([place]) 03/26/2013   Ovarian cyst    Panic attacks    Rectal bleed    Hemorrhoids   Seizures ([place])    last seizure in May 2015     Past Surgical History: Past Surgical History:  Procedure Laterality Date   ANKLE FRACTURE SURGERY     Bilateral - right 2013, left 2010   CHOLECYSTECTOMY N/A 03/24/2013   Procedure: LAPAROSCOPIC CHOLECYSTECTOMY;  Surgeon: [name], MD;  Location: [facility];  Service: General;  Laterality: N/A;   ECTOPIC PREGNANCY SURGERY      Allergies: No Known Allergies  Outpatient Meds: Current Outpatient Medications  Medication Sig Dispense Refill   divalproex (DEPAKOTE ER) 500 MG 24 hr tablet Take 500 mg by mouth 2 (two) times daily.     escitalopram (LEXAPRO) 20 MG tablet Take 20 mg by mouth daily.  0   famotidine (PEPCID) 20 MG tablet Take 1 tablet (20 mg total) by mouth 2 (two) times daily. 30 tablet 0   hydrochlorothiazide (HYDRODIURIL) 25 MG tablet TAKE 1 TABLET(25 MG) BY MOUTH DAILY 90 tablet 1   omeprazole (PRILOSEC) 20 MG capsule TAKE 1 CAPSULE(20 MG) BY MOUTH DAILY 90 capsule 0   prazosin (MINIPRESS) 5 MG  capsule Take 5 mg by mouth daily.  0   topiramate (TOPAMAX) 50 MG tablet Take 1 tablet (50 mg total) by mouth 2 (two) times daily. 180 tablet 1   ziprasidone (GEODON) 80 MG capsule Take 80 mg by mouth 2 (two) times daily.     cetirizine (ZYRTEC) 10 MG tablet Take 1 tablet (10 mg total) by mouth daily. 30 tablet 1   clonazePAM (KLONOPIN) 0.5 MG tablet Take 0.5 mg by mouth 2 (two) times daily as needed.     cyclobenzaprine (FLEXERIL) 10 MG tablet Take 1 tablet (10 mg total) by mouth 2 (two) times daily as needed for muscle spasms. 60 tablet 6   ibuprofen (ADVIL) 800 MG tablet Take 1 tablet (800 mg total) by mouth every 12 (twelve) hours as needed. 60 tablet 5   lisinopril (ZESTRIL) 5 MG tablet TAKE 1 TABLET(5 MG) BY MOUTH DAILY (Patient not taking: Reported on 04/27/2022) 90 tablet 1   Current Facility-Administered Medications  Medication Dose Route Frequency Provider Last Rate Last Admin   0.9 %  sodium chloride infusion  500 mL Intravenous Once Doran Stabler, MD          ___________________________________________________________________ Objective   Exam:  BP 103/62   Pulse 90   Temp 97.7 F (36.5 C)   Ht 5\' 4"  (1.626 m)   Wt 270 lb 9.6 oz (122.7 kg)   LMP  05/06/2022   SpO2 99%   BMI 46.45 kg/m   CV: regular , S1/S2 Resp: clear to auscultation bilaterally, normal RR and effort noted GI: soft, no tenderness, with active bowel sounds.   Assessment: Encounter Diagnosis  Name Primary?   Special screening for malignant neoplasms, colon Yes     Plan: Colonoscopy  The benefits and risks of the planned procedure were described in detail with the patient or (when appropriate) their health care proxy.  Risks were outlined as including, but not limited to, bleeding, infection, perforation, adverse medication reaction leading to cardiac or pulmonary decompensation, pancreatitis (if ERCP).  The limitation of incomplete mucosal visualization was also discussed.  No guarantees or  warranties were given.    The patient is appropriate for an endoscopic procedure in the ambulatory setting.   - Wilfrid Lund, MD

## 2022-05-25 ENCOUNTER — Telehealth: Payer: Self-pay

## 2022-05-25 ENCOUNTER — Other Ambulatory Visit: Payer: Self-pay

## 2022-05-25 MED ORDER — PHENYLEPHRINE IN HARD FAT 0.25 % RE SUPP: 1.0000 | RECTAL | 0 refills | Status: AC | PRN

## 2022-05-25 NOTE — Telephone Encounter (Signed)
Follow up call to pt, no answer.  

## 2022-05-30 ENCOUNTER — Encounter: Payer: Self-pay | Admitting: Gastroenterology

## 2022-07-06 ENCOUNTER — Ambulatory Visit: Payer: Medicaid Other | Admitting: Family Medicine

## 2022-09-09 ENCOUNTER — Other Ambulatory Visit: Payer: Self-pay | Admitting: Family Medicine

## 2022-09-09 DIAGNOSIS — K219 Gastro-esophageal reflux disease without esophagitis: Secondary | ICD-10-CM

## 2022-09-11 NOTE — Telephone Encounter (Signed)
Requested Prescriptions  Pending Prescriptions Disp Refills   omeprazole (PRILOSEC) 20 MG capsule [Pharmacy Med Name: OMEPRAZOLE 20MG  CAPSULES] 90 capsule 0    Sig: TAKE 1 CAPSULE(20 MG) BY MOUTH DAILY     Gastroenterology: Proton Pump Inhibitors Passed - 09/09/2022  8:01 AM      Passed - Valid encounter within last 12 months    Recent Outpatient Visits           8 months ago Need for immunization against influenza   [place] [facility] Hoy Register, MD   1 year ago Screening for metabolic disorder   [place] [facility] Hoy Register, MD   1 year ago Other fatigue   [place] [facility] Hoy Register, MD   2 years ago Bipolar affective disorder, currently depressed, moderate [facility])   [place] [facility] Hoy Register, MD   3 years ago Acute right ankle pain   [place] [facility] Hoy Register, MD       Future Appointments             In 4 weeks Hoy Register, MD [facility]

## 2022-10-09 ENCOUNTER — Ambulatory Visit: Payer: MEDICAID | Attending: Family Medicine | Admitting: Family Medicine

## 2022-10-09 ENCOUNTER — Encounter: Payer: Self-pay | Admitting: Family Medicine

## 2022-10-09 VITALS — BP 121/85 | HR 83 | Ht 64.0 in | Wt 290.8 lb

## 2022-10-09 DIAGNOSIS — Z131 Encounter for screening for diabetes mellitus: Secondary | ICD-10-CM

## 2022-10-09 DIAGNOSIS — I10 Essential (primary) hypertension: Secondary | ICD-10-CM | POA: Diagnosis not present

## 2022-10-09 DIAGNOSIS — G44229 Chronic tension-type headache, not intractable: Secondary | ICD-10-CM

## 2022-10-09 DIAGNOSIS — K219 Gastro-esophageal reflux disease without esophagitis: Secondary | ICD-10-CM | POA: Diagnosis not present

## 2022-10-09 DIAGNOSIS — K625 Hemorrhage of anus and rectum: Secondary | ICD-10-CM

## 2022-10-09 MED ORDER — OMEPRAZOLE 20 MG PO CPDR: DELAYED_RELEASE_CAPSULE | ORAL | 0 refills | Status: DC

## 2022-10-09 MED ORDER — HYDROCHLOROTHIAZIDE 25 MG PO TABS: ORAL_TABLET | ORAL | 1 refills | Status: DC

## 2022-10-09 MED ORDER — TOPIRAMATE 50 MG PO TABS: 50.0000 mg | ORAL_TABLET | Freq: Two times a day (BID) | ORAL | 1 refills | Status: DC

## 2022-10-09 MED ORDER — LISINOPRIL 5 MG PO TABS: ORAL_TABLET | ORAL | 1 refills | Status: DC

## 2022-10-09 NOTE — Patient Instructions (Signed)

## 2022-10-09 NOTE — Progress Notes (Unsigned)
Subjective:  Patient ID: Kathryn Larson, female    DOB: 1971-01-04  Age: 52 y.o. MRN: [id number]  CC: Medical Management of Chronic Issues   HPI Kathryn Larson is a 52 y.o. year old female with a history of hypertension, bipolar disorder, GERD, Migraine.  Interval History: Discussed the use of AI scribe software for clinical note transcription with the patient, who gave verbal consent to proceed.  She was previously evaluated in the emergency department in February for syncope associated with rectal bleeding, but her hemoglobin was normal. She subsequently saw a gastroenterologist who performed a colonoscopy revealed internal hemorrhoids, 4 sessile polyps removed.  Repeat colonoscopy recommended in 5 years. She was advised to use suppositories as needed for bleeding and to avoid constipation by consuming a high fiber diet. She reports ongoing, albeit reduced, rectal bleeding.  The patient also reports frequent migraines, occurring two to three times a week despite prophylactic Topamax. She uses ibuprofen for breakthrough migraines, which she reports is effective.  The patient has also gained significant weight, currently weighing 290 pounds, the highest in her life. She expresses interest in a weight loss program, specifically bariatric surgery, at [facility].  She continues to manage her hypertension with lisinopril and hydrochlorothiazide, acid reflux with omeprazole, and bipolar disorder is managed by psychiatry with with Depakote, Lexapro, and prazosin.        Past Medical History:  Diagnosis Date   Anxiety    Bipolar affective (H[facility])    Bipolar disorder, unspecified (HCC) 03/26/2013   Calculus of gallbladder with acute cholecystitis, without mention of obstruction 03/26/2013   Carpal tunnel syndrome    Cataract    GERD (gastroesophageal reflux disease) 02/28/1992   GERD (gastroesophageal reflux disease) 03/26/2013   Hyperlipidemia    Hypertension    Obesity    Obesity,  Class III, BMI 40-49.9 (morbid obesity) (HCC) 03/26/2013   Ovarian cyst    Panic attacks    Rectal bleed    Hemorrhoids   Seizures ([facility])    last seizure in May 2015    Past Surgical History:  Procedure Laterality Date   ANKLE FRACTURE SURGERY     Bilateral - right 2013, left 2010   CHOLECYSTECTOMY N/A 03/24/2013   Procedure: LAPAROSCOPIC CHOLECYSTECTOMY;  Surgeon: Cherylynn Ridges, MD;  Location[facility];  Service: General;  Laterality: N/A;   ECTOPIC PREGNANCY SURGERY      Family History  Problem Relation Age of Onset   Diabetes Mother    Diabetes Sister    Rectal cancer Maternal Uncle    Stomach cancer Maternal Uncle    Cancer Maternal Grandmother    Colon cancer Neg Hx    Colon polyps Neg Hx    Esophageal cancer Neg Hx     Social History   Socioeconomic History   Marital status: Legally Separated    Spouse name: Not on file   Number of children: Not on file   Years of education: Not on file   Highest education level: Not on file  Occupational History   Occupation: disabled    Comment: mental health and ankles  Tobacco Use   Smoking status: Every Day    Current packs/day: 0.25    Types: Cigarettes   Smokeless tobacco: Never  Vaping Use   Vaping status: Never Used  Substance and Sexual Activity   Alcohol use: Yes    Alcohol/week: 0.0 standard drinks of alcohol    Comment: occ.   Drug use: No   Sexual activity: Yes  Partners: Male    Birth control/protection: None  Other Topics Concern   Not on file  Social History Narrative   Not on file   Social Determinants of Health   Financial Resource Strain: Not on file  Food Insecurity: Not on file  Transportation Needs: Not on file  Physical Activity: Not on file  Stress: Not on file  Social Connections: Not on file    No Known Allergies  Outpatient Medications Prior to Visit  Medication Sig Dispense Refill   cetirizine (ZYRTEC) 10 MG tablet Take 1 tablet (10 mg total) by mouth daily. 30 tablet 1    clonazePAM (KLONOPIN) 0.5 MG tablet Take 0.5 mg by mouth 2 (two) times daily as needed.     cyclobenzaprine (FLEXERIL) 10 MG tablet Take 1 tablet (10 mg total) by mouth 2 (two) times daily as needed for muscle spasms. 60 tablet 6   divalproex (DEPAKOTE ER) 500 MG 24 hr tablet Take 500 mg by mouth 2 (two) times daily.     escitalopram (LEXAPRO) 20 MG tablet Take 20 mg by mouth daily.  0   famotidine (PEPCID) 20 MG tablet Take 1 tablet (20 mg total) by mouth 2 (two) times daily. 30 tablet 0   ibuprofen (ADVIL) 800 MG tablet Take 1 tablet (800 mg total) by mouth every 12 (twelve) hours as needed. 60 tablet 5   phenylephrine (,USE FOR PREPARATION-H,) 0.25 % suppository Place 1 suppository rectally as needed for hemorrhoids. 12 suppository 0   prazosin (MINIPRESS) 5 MG capsule Take 5 mg by mouth daily.  0   ziprasidone (GEODON) 80 MG capsule Take 80 mg by mouth 2 (two) times daily.     hydrochlorothiazide (HYDRODIURIL) 25 MG tablet TAKE 1 TABLET(25 MG) BY MOUTH DAILY 90 tablet 1   lisinopril (ZESTRIL) 5 MG tablet TAKE 1 TABLET(5 MG) BY MOUTH DAILY 90 tablet 1   omeprazole (PRILOSEC) 20 MG capsule TAKE 1 CAPSULE(20 MG) BY MOUTH DAILY 90 capsule 0   topiramate (TOPAMAX) 50 MG tablet Take 1 tablet (50 mg total) by mouth 2 (two) times daily. 180 tablet 1   Facility-Administered Medications Prior to Visit  Medication Dose Route Frequency Provider Last Rate Last Admin   0.9 %  sodium chloride infusion  500 mL Intravenous Once Charlie Pitter III, MD         ROS Review of Systems  Constitutional:  Negative for activity change and appetite change.  HENT:  Negative for sinus pressure and sore throat.   Respiratory:  Negative for chest tightness, shortness of breath and wheezing.   Cardiovascular:  Negative for chest pain and palpitations.  Gastrointestinal:  Negative for abdominal distention, abdominal pain and constipation.  Genitourinary: Negative.   Musculoskeletal: Negative.    Psychiatric/Behavioral:  Negative for behavioral problems and dysphoric mood.     Objective:  BP 121/85   Pulse 83   Ht 5\' 4"  (1.626 m)   Wt 290 lb 12.8 oz (131.9 kg)   SpO2 98%   BMI 49.92 kg/m      10/09/2022    4:23 PM 05/24/2022    2:04 PM 05/24/2022    1:54 PM  BP/Weight  Systolic BP 121 127 107  Diastolic BP 85 85 83  Wt. (Lbs) 290.8    BMI 49.92 kg/m2        Physical Exam Constitutional:      Appearance: She is well-developed.  Cardiovascular:     Rate and Rhythm: Normal rate.     Heart  sounds: Normal heart sounds. No murmur heard. Pulmonary:     Effort: Pulmonary effort is normal.     Breath sounds: Normal breath sounds. No wheezing or rales.  Chest:     Chest wall: No tenderness.  Abdominal:     General: Bowel sounds are normal. There is no distension.     Palpations: Abdomen is soft. There is no mass.     Tenderness: There is no abdominal tenderness.  Musculoskeletal:        General: Normal range of motion.     Right lower leg: No edema.     Left lower leg: No edema.  Neurological:     Mental Status: She is alert and oriented to person, place, and time.  Psychiatric:        Mood and Affect: Mood normal.        Latest Ref Rng & Units 04/17/2022    4:07 PM 01/04/2022    4:30 PM 08/02/2021    4:15 PM  CMP  Glucose 70 - 99 mg/dL 93  85  92   BUN 6 - 20 mg/dL 9  9  10    Creatinine 0.44 - 1.00 mg/dL 1.61  0.96  0.45   Sodium 135 - 145 mmol/L 137  139  137   Potassium 3.5 - 5.1 mmol/L 3.5  4.2  3.7   Chloride 98 - 111 mmol/L 103  101  104   CO2 22 - 32 mmol/L 23  23  23    Calcium 8.9 - 10.3 mg/dL 8.7  9.8  9.2   Total Protein 6.5 - 8.1 g/dL 7.1  7.1  7.2   Total Bilirubin 0.3 - 1.2 mg/dL 0.6  0.3  0.7   Alkaline Phos 38 - 126 U/L 81  106  89   AST 15 - 41 U/L 29  82  75   ALT 0 - 44 U/L 30  84  67     Lipid Panel     Component Value Date/Time   CHOL 170 01/04/2022 1630   TRIG 72 01/04/2022 1630   HDL 49 01/04/2022 1630   CHOLHDL 4.8  11/19/2015 1116   VLDL 19 11/19/2015 1116   LDLCALC 107 (H) 01/04/2022 1630    CBC    Component Value Date/Time   WBC 8.9 04/17/2022 1607   RBC 4.55 04/17/2022 1607   HGB 13.7 04/17/2022 1831   HGB 13.7 07/13/2021 0945   HCT 44.1 04/17/2022 1831   HCT 39.9 07/13/2021 0945   PLT 352 04/17/2022 1607   PLT 332 07/13/2021 0945   MCV 95.4 04/17/2022 1607   MCV 91 07/13/2021 0945   MCH 32.1 04/17/2022 1607   MCHC 33.6 04/17/2022 1607   RDW 13.5 04/17/2022 1607   RDW 13.5 07/13/2021 0945   LYMPHSABS 3.1 08/02/2021 1615   LYMPHSABS 3.7 (H) 07/13/2021 0945   MONOABS 0.6 08/02/2021 1615   EOSABS 0.1 08/02/2021 1615   EOSABS 0.2 07/13/2021 0945   BASOSABS 0.0 08/02/2021 1615   BASOSABS 0.1 07/13/2021 0945    Lab Results  Component Value Date   HGBA1C 5.3 07/13/2021    Assessment & Plan:      Rectal Bleeding Secondary to internal hemorrhoids with GI -Hemoglobin is stable Resolved with suppository use as needed. Advised to maintain high fiber diet to prevent constipation and straining. -Continue current management plan.  Hypertension Well controlled on Lisinopril and Hydrochlorothiazide. -Continue current medications.  Gastroesophageal Reflux Disease Managed with Omeprazole. -Continue Omeprazole as prescribed.  Migraines  Frequent occurrence (2-3 times/week), managed with Topamax and breakthrough pain with Ibuprofen. -Continue current management plan.  Bipolar disorder Managed with Depakote, Lexapro, and Prazosin. -Continue current medications.  Morbid obesity Expressed interest in weight loss program and bariatric surgery. -Referral to bariatric surgery.  General Health Maintenance -Scheduled for Pap smear next month. -Scheduled for mammogram. -Order fasting labs today to check cholesterol, kidney and liver function. -Rescreen for diabetes.          Meds ordered this encounter  Medications   hydrochlorothiazide (HYDRODIURIL) 25 MG tablet    Sig: TAKE  1 TABLET(25 MG) BY MOUTH DAILY    Dispense:  90 tablet    Refill:  1   lisinopril (ZESTRIL) 5 MG tablet    Sig: TAKE 1 TABLET(5 MG) BY MOUTH DAILY    Dispense:  90 tablet    Refill:  1   omeprazole (PRILOSEC) 20 MG capsule    Sig: TAKE 1 CAPSULE(20 MG) BY MOUTH DAILY    Dispense:  90 capsule    Refill:  0    ZERO refills remain on this prescription. Your patient is requesting advance approval of refills for this medication to PREVENT ANY MISSED DOSES   topiramate (TOPAMAX) 50 MG tablet    Sig: Take 1 tablet (50 mg total) by mouth 2 (two) times daily.    Dispense:  180 tablet    Refill:  1    Follow-up: Return in about 6 months (around 04/11/2023) for Chronic medical conditions.       Hoy Register, MD, FAAFP. [facility] and [facility] [place], [place] [phone]   10/09/2022, 4:39 PM

## 2022-11-24 ENCOUNTER — Ambulatory Visit: Payer: MEDICAID

## 2022-11-27 ENCOUNTER — Ambulatory Visit: Payer: Medicaid Other | Admitting: Obstetrics and Gynecology

## 2022-12-18 ENCOUNTER — Other Ambulatory Visit: Payer: Self-pay | Admitting: Family Medicine

## 2022-12-18 DIAGNOSIS — G44229 Chronic tension-type headache, not intractable: Secondary | ICD-10-CM

## 2022-12-19 NOTE — Telephone Encounter (Signed)
Requested Prescriptions  Refused Prescriptions Disp Refills   topiramate (TOPAMAX) 50 MG tablet [Pharmacy Med Name: TOPIRAMATE 50MG  TABLETS] 180 tablet 1    Sig: TAKE 1 TABLET(50 MG) BY MOUTH TWICE DAILY     Neurology: Anticonvulsants - topiramate & zonisamide Passed - 12/18/2022  5:32 PM      Passed - Cr in normal range and within 360 days    Creat  Date Value Ref Range Status  11/19/2015 0.89 0.50 - 1.10 mg/dL Final   Creatinine, Ser  Date Value Ref Range Status  10/09/2022 0.87 0.57 - 1.00 mg/dL Final         Passed - CO2 in normal range and within 360 days    CO2  Date Value Ref Range Status  10/09/2022 20 20 - 29 mmol/L Final         Passed - ALT in normal range and within 360 days    ALT  Date Value Ref Range Status  10/09/2022 17 0 - 32 IU/L Final         Passed - AST in normal range and within 360 days    AST  Date Value Ref Range Status  10/09/2022 19 0 - 40 IU/L Final         Passed - Completed PHQ-2 or PHQ-9 in the last 360 days      Passed - Valid encounter within last 12 months    Recent Outpatient Visits           2 months ago Obesity, Class III, BMI 40-49.9 (morbid obesity) (HCC)   [place] [facility] Hoy Register, MD   11 months ago Need for immunization against influenza   [facility] Hoy Register, MD   1 year ago Screening for metabolic disorder   [place] [facility] Hoy Register, MD   2 years ago Other fatigue   [place] [facility] Hoy Register, MD   2 years ago Bipolar affective disorder, currently depressed, moderate [facility])   [place] [facility] Hoy Register, MD       Future Appointments             In 3 months Hoy Register, MD [facility]

## 2023-01-05 ENCOUNTER — Other Ambulatory Visit (HOSPITAL_COMMUNITY)
Admission: RE | Admit: 2023-01-05 | Discharge: 2023-01-05 | Disposition: A | Discharge status: Home / Self Care | Payer: MEDICAID | Source: Ambulatory Visit | Attending: Obstetrics | Admitting: Obstetrics

## 2023-01-05 ENCOUNTER — Ambulatory Visit: Payer: MEDICAID | Admitting: Obstetrics and Gynecology

## 2023-01-05 ENCOUNTER — Encounter: Payer: Self-pay | Admitting: Obstetrics and Gynecology

## 2023-01-05 VITALS — BP 114/81 | HR 94 | Ht 64.0 in | Wt 283.0 lb

## 2023-01-05 DIAGNOSIS — Z124 Encounter for screening for malignant neoplasm of cervix: Secondary | ICD-10-CM | POA: Insufficient documentation

## 2023-01-05 DIAGNOSIS — Z113 Encounter for screening for infections with a predominantly sexual mode of transmission: Secondary | ICD-10-CM

## 2023-01-05 DIAGNOSIS — Z1231 Encounter for screening mammogram for malignant neoplasm of breast: Secondary | ICD-10-CM

## 2023-01-05 DIAGNOSIS — N898 Other specified noninflammatory disorders of vagina: Secondary | ICD-10-CM

## 2023-01-05 DIAGNOSIS — B9689 Other specified bacterial agents as the cause of diseases classified elsewhere: Secondary | ICD-10-CM | POA: Diagnosis not present

## 2023-01-05 DIAGNOSIS — F339 Major depressive disorder, recurrent, unspecified: Secondary | ICD-10-CM

## 2023-01-05 DIAGNOSIS — N760 Acute vaginitis: Secondary | ICD-10-CM | POA: Diagnosis not present

## 2023-01-05 DIAGNOSIS — Z01419 Encounter for gynecological examination (general) (routine) without abnormal findings: Secondary | ICD-10-CM

## 2023-01-05 NOTE — Progress Notes (Signed)
GYNECOLOGY ANNUAL PREVENTATIVE CARE ENCOUNTER NOTE  Subjective:   Kathryn Larson is a 52 y.o. [phone] female here for a annual gynecologic exam. Current complaints: reports an abnormal period last month. States she normally has monthly cycles, but last month she had the other symptoms (breast tenderness, back pain) but only saw blood with wiping. Normally cycles last four days. Denies any kind of hormonal management of periods, has not had BTL.    Does have some vaginal itching, uses epsom salts which helps a little. Reports fishy vaginal odor as well. These have been happening for 3-4 months. Also some vaginal dryness during intercourse and occasional pain.  Denies abnormal discharge, pelvic pain, problems with intercourse or other gynecologic concerns. Requests STI screen.   Gynecologic History Patient's last menstrual period was 11/23/2022 (exact date). Contraception: none Last Pap: 06/2019. Results: negative Last mammogram: 08/2021. 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 DEXA: has never had  Obstetric History OB History  Gravida Para Term Preterm AB Living  6 2 2   4 2   SAB IAB Ectopic Multiple Live Births  4       2    # Outcome Date GA Lbr Len/2nd Weight Sex Type Anes PTL Lv  6 Term 10/02/92        LIV  5 Term 04/15/87        LIV  4 SAB           3 SAB           2 SAB           1 SAB             Past Medical History:  Diagnosis Date   Anxiety    Bipolar affective (HCC)    Bipolar disorder, unspecified (HCC) 03/26/2013   Calculus of gallbladder with acute cholecystitis, without mention of obstruction 03/26/2013   Carpal tunnel syndrome    Cataract    GERD (gastroesophageal reflux disease) 02/28/1992   GERD (gastroesophageal reflux disease) 03/26/2013   Hyperlipidemia    Hypertension    Obesity    Obesity, Class III, BMI 40-49.9 (morbid obesity) (HCC) 03/26/2013   Ovarian cyst    Panic attacks    Rectal bleed    Hemorrhoids   Seizures (HCC)    last  seizure in May 2015    Past Surgical History:  Procedure Laterality Date   ANKLE FRACTURE SURGERY     Bilateral - right 2013, left 2010   CHOLECYSTECTOMY N/A 03/24/2013   Procedure: LAPAROSCOPIC CHOLECYSTECTOMY;  Surgeon: Cherylynn Ridges, MD;  Location: [facility] OR;  Service: General;  Laterality: N/A;   ECTOPIC PREGNANCY SURGERY      Current Outpatient Medications on File Prior to Visit  Medication Sig Dispense Refill   cetirizine (ZYRTEC) 10 MG tablet Take 1 tablet (10 mg total) by mouth daily. 30 tablet 1   clonazePAM (KLONOPIN) 0.5 MG tablet Take 0.5 mg by mouth 2 (two) times daily as needed.     cyclobenzaprine (FLEXERIL) 10 MG tablet Take 1 tablet (10 mg total) by mouth 2 (two) times daily as needed for muscle spasms. 60 tablet 6   divalproex (DEPAKOTE ER) 500 MG 24 hr tablet Take 500 mg by mouth 2 (two) times daily.     escitalopram (LEXAPRO) 20 MG tablet Take 20 mg by mouth daily.  0   famotidine (PEPCID) 20 MG tablet Take 1 tablet (20 mg total) by mouth 2 (two) times daily. 30 tablet 0  hydrochlorothiazide (HYDRODIURIL) 25 MG tablet TAKE 1 TABLET(25 MG) BY MOUTH DAILY 90 tablet 1   ibuprofen (ADVIL) 800 MG tablet Take 1 tablet (800 mg total) by mouth every 12 (twelve) hours as needed. 60 tablet 5   lisinopril (ZESTRIL) 5 MG tablet TAKE 1 TABLET(5 MG) BY MOUTH DAILY 90 tablet 1   omeprazole (PRILOSEC) 20 MG capsule TAKE 1 CAPSULE(20 MG) BY MOUTH DAILY 90 capsule 0   phenylephrine (,USE FOR PREPARATION-H,) 0.25 % suppository Place 1 suppository rectally as needed for hemorrhoids. 12 suppository 0   prazosin (MINIPRESS) 5 MG capsule Take 5 mg by mouth daily.  0   topiramate (TOPAMAX) 50 MG tablet Take 1 tablet (50 mg total) by mouth 2 (two) times daily. 180 tablet 1   ziprasidone (GEODON) 80 MG capsule Take 80 mg by mouth 2 (two) times daily.     No current facility-administered medications on file prior to visit.    No Known Allergies  Social History   Socioeconomic History    Marital status: Legally Separated    Spouse name: Not on file   Number of children: Not on file   Years of education: Not on file   Highest education level: Not on file  Occupational History   Occupation: disabled    Comment: mental health and ankles  Tobacco Use   Smoking status: Every Day    Current packs/day: 0.25    Types: Cigarettes   Smokeless tobacco: Never  Vaping Use   Vaping status: Never Used  Substance and Sexual Activity   Alcohol use: Yes    Alcohol/week: 0.0 standard drinks of alcohol    Comment: occ.   Drug use: No   Sexual activity: Yes    Partners: Male    Birth control/protection: None  Other Topics Concern   Not on file  Social History Narrative   Not on file   Social Determinants of Health   Financial Resource Strain: High Risk (12/19/2022)   Received from [facility]   Overall Financial Resource Strain (CARDIA)    Difficulty of Paying Living Expenses: Hard  Food Insecurity: Food Insecurity Present (12/19/2022)   Received from [facility]   Hunger Vital Sign    Worried About Running Out of Food in the Last Year: Sometimes true    Ran Out of Food in the Last Year: Sometimes true  Transportation Needs: Unmet Transportation Needs (12/19/2022)   Received from [facility] - Transportation    Lack of Transportation (Medical): Yes    Lack of Transportation (Non-Medical): No  Physical Activity: Insufficiently Active (12/19/2022)   Received from [facility]   Exercise Vital Sign    Days of Exercise per Week: 2 days    Minutes of Exercise per Session: 30 min  Stress: Stress Concern Present (12/19/2022)   Received from [facility] - Occupational Stress Questionnaire    Feeling of Stress : Very much  Social Connections: Somewhat Isolated (12/19/2022)   [facility]   Social Network    How would you rate your social network (family, work, friends)?: Restricted participation  with some degree of social isolation  Intimate Partner Violence: Unknown (11/08/2022)   Received from [facility]   HITS    Physically Hurt: Not on file    Insult or Talk Down To: Not on file    Threaten Physical Harm: Not on file    Scream or Curse: Not on file  Family History  Problem Relation Age of Onset   Diabetes Mother    Diabetes Sister    Rectal cancer Maternal Uncle    Stomach cancer Maternal Uncle    Cancer Maternal Grandmother    Colon cancer Neg Hx    Colon polyps Neg Hx    Esophageal cancer Neg Hx     1 The following portions of the patient's history were reviewed and updated as appropriate: allergies, current medications, past family history, past medical history, past social history, past surgical history and problem list.  Review of Systems Pertinent items are noted in HPI.   Objective:  BP 114/81   Pulse 94   Ht 5\' 4"  (1.626 m)   Wt 283 lb (128.4 kg)   LMP 11/23/2022 (Exact Date)   BMI 48.58 kg/m  CONSTITUTIONAL: Well-developed, well-nourished female in no acute distress.  HENT:  Normocephalic, atraumatic, External right and left ear normal. Oropharynx is clear and moist EYES: Conjunctivae and EOM are normal. Pupils are equal, round, and reactive to light. No scleral icterus.  NECK: Normal range of motion, supple, no masses.  Normal thyroid.  SKIN: Skin is warm and dry. No rash noted. Not diaphoretic. No erythema. No pallor. NEUROLOGIC: Alert and oriented to person, place, and time. Normal reflexes, muscle tone coordination. No cranial nerve deficit noted. PSYCHIATRIC: Normal mood and affect. Normal behavior. Normal judgment and thought content. CARDIOVASCULAR: Normal heart rate noted, regular rhythm RESPIRATORY: Clear to auscultation bilaterally. Effort and breath sounds normal, no problems with respiration noted. BREASTS: Symmetric in size. No masses, skin changes, nipple drainage, or lymphadenopathy. ABDOMEN: Soft, no distention noted.  No  tenderness, rebound or guarding.  PELVIC: Normal appearing external genitalia; normal appearing vaginal mucosa and cervix.  No abnormal discharge noted.  Scant dark red blood in vaginal vault. Pap smear obtained. Pelvic cultures obtained. Normal uterine size, no other palpable masses, no uterine or adnexal tenderness. MUSCULOSKELETAL: Normal range of motion. No tenderness.  No cyanosis, clubbing, or edema.   Exam done with chaperone present.   Assessment and Plan:   1. Well woman exam Healthy female exam Provided reassurance that light period is likely beginning of menopausal transition  2. Routine screening for STI (sexually transmitted infection) No exposures - Cervicovaginal ancillary only( [place]) - HIV antibody (with reflex) - Hepatitis C Antibody - Hepatitis B Surface AntiGEN - RPR  3. Cervical cancer screening - Cytology - PAP( [place])  4. Encounter for screening mammogram for malignant neoplasm of breast H/o benigh biposy last year - MM 3D SCREENING MAMMOGRAM BILATERAL BREAST; Future  5. Vaginal odor - Cervicovaginal ancillary only( [place])  6. Vaginal itching - Cervicovaginal ancillary only( [place])  7. Vaginal dryness [facility]   8. Recurrent major depressive disorder, remission status unspecified (HCC) Elevated PHQ9, on lexapro, denies SI/HI but is feeling symptoms - to follow up with PCP for dose adjustment   Will follow up results of pap smear/STI screen and manage accordingly. Encouraged improvement in diet and exercise.    Routine preventative health maintenance measures emphasized. Please refer to After Visit Summary for other counseling recommendations.    Baldemar Lenis, MD, [facility] Attending Center for [employer] [facility])

## 2023-01-06 LAB — HEPATITIS B SURFACE ANTIGEN: Hepatitis B Surface Ag: NEGATIVE

## 2023-01-06 LAB — RPR: RPR Ser Ql: NONREACTIVE

## 2023-01-06 LAB — HIV ANTIBODY (ROUTINE TESTING W REFLEX): HIV Screen 4th Generation wRfx: NONREACTIVE

## 2023-01-06 LAB — HEPATITIS C ANTIBODY: Hep C Virus Ab: NONREACTIVE

## 2023-01-08 LAB — CERVICOVAGINAL ANCILLARY ONLY
Bacterial Vaginitis (gardnerella): POSITIVE — AB
Candida Glabrata: NEGATIVE
Candida Vaginitis: NEGATIVE
Chlamydia: NEGATIVE
Comment: NEGATIVE
Comment: NEGATIVE
Comment: NEGATIVE
Comment: NEGATIVE
Comment: NEGATIVE
Comment: NORMAL
Neisseria Gonorrhea: NEGATIVE
Trichomonas: NEGATIVE

## 2023-01-09 ENCOUNTER — Ambulatory Visit: Payer: MEDICAID

## 2023-01-09 ENCOUNTER — Other Ambulatory Visit: Payer: Self-pay | Admitting: Family Medicine

## 2023-01-09 LAB — CYTOLOGY - PAP
Comment: NEGATIVE
Diagnosis: NEGATIVE
High risk HPV: NEGATIVE

## 2023-01-09 MED ORDER — METRONIDAZOLE 500 MG PO TABS: 500.0000 mg | ORAL_TABLET | Freq: Two times a day (BID) | ORAL | 0 refills | Status: AC

## 2023-01-10 ENCOUNTER — Ambulatory Visit
Admission: RE | Admit: 2023-01-10 | Discharge: 2023-01-10 | Disposition: A | Discharge status: Home / Self Care | Payer: MEDICAID | Source: Ambulatory Visit | Attending: Obstetrics and Gynecology | Admitting: Obstetrics and Gynecology

## 2023-01-10 ENCOUNTER — Other Ambulatory Visit: Payer: Self-pay | Admitting: Family Medicine

## 2023-01-10 DIAGNOSIS — Z1231 Encounter for screening mammogram for malignant neoplasm of breast: Secondary | ICD-10-CM

## 2023-01-10 DIAGNOSIS — M62838 Other muscle spasm: Secondary | ICD-10-CM

## 2023-04-11 ENCOUNTER — Encounter: Payer: Self-pay | Admitting: Family Medicine

## 2023-04-11 ENCOUNTER — Ambulatory Visit: Payer: MEDICAID | Attending: Family Medicine | Admitting: Family Medicine

## 2023-04-11 VITALS — BP 114/79 | HR 98 | Ht 64.0 in | Wt 291.4 lb

## 2023-04-11 DIAGNOSIS — F3132 Bipolar disorder, current episode depressed, moderate: Secondary | ICD-10-CM | POA: Diagnosis not present

## 2023-04-11 DIAGNOSIS — G44229 Chronic tension-type headache, not intractable: Secondary | ICD-10-CM

## 2023-04-11 DIAGNOSIS — K219 Gastro-esophageal reflux disease without esophagitis: Secondary | ICD-10-CM

## 2023-04-11 DIAGNOSIS — Z23 Encounter for immunization: Secondary | ICD-10-CM

## 2023-04-11 DIAGNOSIS — I10 Essential (primary) hypertension: Secondary | ICD-10-CM | POA: Diagnosis not present

## 2023-04-11 MED ORDER — TOPIRAMATE 50 MG PO TABS: 50.0000 mg | ORAL_TABLET | Freq: Two times a day (BID) | ORAL | 1 refills | Status: DC

## 2023-04-11 MED ORDER — HYDROCHLOROTHIAZIDE 25 MG PO TABS: ORAL_TABLET | ORAL | 1 refills | Status: DC

## 2023-04-11 MED ORDER — LIDOCAINE 5 % EX PTCH: 1.0000 | MEDICATED_PATCH | CUTANEOUS | 1 refills | Status: AC

## 2023-04-11 MED ORDER — OMEPRAZOLE 20 MG PO CPDR: DELAYED_RELEASE_CAPSULE | ORAL | 1 refills | Status: DC

## 2023-04-11 MED ORDER — LISINOPRIL 5 MG PO TABS: ORAL_TABLET | ORAL | 1 refills | Status: DC

## 2023-04-11 NOTE — Progress Notes (Signed)
Subjective:  Patient ID: Kathryn Larson, female    DOB: 10-Sep-1970  Age: 53 y.o. MRN: [id number]  CC: Medical Management of Chronic Issues (Back pain/Depression)   HPI Kathryn Larson is a 53 y.o. year old female with a history of hypertension, bipolar disorder (managed by [facility]), GERD, Migraine.   Interval History: Discussed the use of AI scribe software for clinical note transcription with the patient, who gave verbal consent to proceed.  She presents with worsening back pain. She rates the pain as a 9 out of 10, which is more severe than usual. The pain is localized to the mid and lower back and does not radiate down the legs. She believes the cold weather may be exacerbating the pain. She has been managing the pain with cyclobenzaprine and Tylenol, but reports that the Tylenol is ineffective. She is limited to Tylenol for pain management due to an upcoming bypass surgery scheduled for next month.  In addition to the back pain, the patient reports uncontrolled depression despite adherence to Lexapro. She has been seeing a psychiatrist at [facility] and has been advised to continue her medication regimen without skipping doses. She had run out of medication and missed a few days, but has since refilled her prescription. Despite this, she has not noticed any improvement in her depression symptoms.   Blood pressure is controlled on her current regimen and her migraines are also controlled.  For her GERD she is on a PPI.       Past Medical History:  Diagnosis Date   Anxiety    Bipolar affective (HCC)    Bipolar disorder, unspecified (HCC) 03/26/2013   Calculus of gallbladder with acute cholecystitis, without mention of obstruction 03/26/2013   Carpal tunnel syndrome    Cataract    GERD (gastroesophageal reflux disease) 02/28/1992   GERD (gastroesophageal reflux disease) 03/26/2013   Hyperlipidemia    Hypertension    Obesity    Obesity, Class III, BMI  40-49.9 (morbid obesity) (HCC) 03/26/2013   Ovarian cyst    Panic attacks    Rectal bleed    Hemorrhoids   Seizures (HCC)    last seizure in May 2015    Past Surgical History:  Procedure Laterality Date   ANKLE FRACTURE SURGERY     Bilateral - right 2013, left 2010   BREAST BIOPSY Left 09/07/2021   CHOLECYSTECTOMY N/A 03/24/2013   Procedure: LAPAROSCOPIC CHOLECYSTECTOMY;  Surgeon: Cherylynn Ridges, MD;  Location: [facility] OR;  Service: General;  Laterality: N/A;   ECTOPIC PREGNANCY SURGERY      Family History  Problem Relation Age of Onset   Diabetes Mother    Diabetes Sister    Rectal cancer Maternal Uncle    Stomach cancer Maternal Uncle    Cancer Maternal Grandmother    Colon cancer Neg Hx    Colon polyps Neg Hx    Esophageal cancer Neg Hx    BRCA 1/2 Neg Hx    Breast cancer Neg Hx     Social History   Socioeconomic History   Marital status: Legally Separated    Spouse name: Not on file   Number of children: Not on file   Years of education: Not on file   Highest education level: 10th grade  Occupational History   Occupation: disabled    Comment: mental health and ankles  Tobacco Use   Smoking status: Former    Current packs/day: 0.00    Types: Cigarettes    Quit date:  02/2023    Years since quitting: 0.1   Smokeless tobacco: Former  Building services engineer status: Never Used  Substance and Sexual Activity   Alcohol use: Yes    Alcohol/week: 0.0 standard drinks of alcohol    Comment: occ.   Drug use: No   Sexual activity: Yes    Partners: Male    Birth control/protection: None  Other Topics Concern   Not on file  Social History Narrative   Not on file   Social Drivers of Health   Financial Resource Strain: High Risk (04/07/2023)   Overall Financial Resource Strain (CARDIA)    Difficulty of Paying Living Expenses: Very hard  Food Insecurity: Food Insecurity Present (04/07/2023)   Hunger Vital Sign    Worried About Running Out of Food in the Last Year: Often  true    Ran Out of Food in the Last Year: Often true  Transportation Needs: No Transportation Needs (04/07/2023)   PRAPARE - Administrator, Civil Service (Medical): No    Lack of Transportation (Non-Medical): No  Physical Activity: Insufficiently Active (04/07/2023)   Exercise Vital Sign    Days of Exercise per Week: 3 days    Minutes of Exercise per Session: 30 min  Stress: Stress Concern Present (04/07/2023)   [employer] of Occupational Health - Occupational Stress Questionnaire    Feeling of Stress : Very much  Social Connections: Moderately Integrated (04/07/2023)   Social Connection and Isolation Panel [NHANES]    Frequency of Communication with Friends and Family: Once a week    Frequency of Social Gatherings with Friends and Family: Twice a week    Attends Religious Services: 1 to 4 times per year    Active Member of [employer] or Organizations: No    Attends Engineer, structural: Not on file    Marital Status: Living with partner    No Known Allergies  Outpatient Medications Prior to Visit  Medication Sig Dispense Refill   cetirizine (ZYRTEC) 10 MG tablet Take 1 tablet (10 mg total) by mouth daily. 30 tablet 1   clonazePAM (KLONOPIN) 0.5 MG tablet Take 0.5 mg by mouth 2 (two) times daily as needed.     cyclobenzaprine (FLEXERIL) 10 MG tablet TAKE 1 TABLET(10 MG) BY MOUTH TWICE DAILY AS NEEDED FOR MUSCLE SPASMS 60 tablet 2   divalproex (DEPAKOTE ER) 500 MG 24 hr tablet Take 500 mg by mouth 2 (two) times daily.     escitalopram (LEXAPRO) 20 MG tablet Take 20 mg by mouth daily.  0   famotidine (PEPCID) 20 MG tablet Take 1 tablet (20 mg total) by mouth 2 (two) times daily. 30 tablet 0   ibuprofen (ADVIL) 800 MG tablet Take 1 tablet (800 mg total) by mouth every 12 (twelve) hours as needed. 60 tablet 5   phenylephrine (,USE FOR PREPARATION-H,) 0.25 % suppository Place 1 suppository rectally as needed for hemorrhoids. 12 suppository 0   prazosin (MINIPRESS) 5 MG  capsule Take 5 mg by mouth daily.  0   ziprasidone (GEODON) 80 MG capsule Take 80 mg by mouth 2 (two) times daily.     hydrochlorothiazide (HYDRODIURIL) 25 MG tablet TAKE 1 TABLET(25 MG) BY MOUTH DAILY 90 tablet 1   lisinopril (ZESTRIL) 5 MG tablet TAKE 1 TABLET(5 MG) BY MOUTH DAILY 90 tablet 1   omeprazole (PRILOSEC) 20 MG capsule TAKE 1 CAPSULE(20 MG) BY MOUTH DAILY 90 capsule 0   topiramate (TOPAMAX) 50 MG tablet Take 1 tablet (  50 mg total) by mouth 2 (two) times daily. 180 tablet 1   No facility-administered medications prior to visit.     ROS Review of Systems  Constitutional:  Negative for activity change and appetite change.  HENT:  Negative for sinus pressure and sore throat.   Respiratory:  Negative for chest tightness, shortness of breath and wheezing.   Cardiovascular:  Negative for chest pain and palpitations.  Gastrointestinal:  Negative for abdominal distention, abdominal pain and constipation.  Genitourinary: Negative.   Musculoskeletal: Negative.   Psychiatric/Behavioral:  Negative for behavioral problems and dysphoric mood.     Objective:  BP 114/79   Pulse 98   Ht 5\' 4"  (1.626 m)   Wt 291 lb 6.4 oz (132.2 kg)   SpO2 97%   BMI 50.02 kg/m      04/11/2023    2:15 PM 01/05/2023    8:24 AM 10/09/2022    4:23 PM  BP/Weight  Systolic BP 114 114 121  Diastolic BP 79 81 85  Wt. (Lbs) 291.4 283 290.8  BMI 50.02 kg/m2 48.58 kg/m2 49.92 kg/m2      Physical Exam Constitutional:      Appearance: She is well-developed.  Cardiovascular:     Rate and Rhythm: Normal rate.     Heart sounds: Normal heart sounds. No murmur heard. Pulmonary:     Effort: Pulmonary effort is normal.     Breath sounds: Normal breath sounds. No wheezing or rales.  Chest:     Chest wall: No tenderness.  Abdominal:     General: Bowel sounds are normal. There is no distension.     Palpations: Abdomen is soft. There is no mass.     Tenderness: There is no abdominal tenderness.   Musculoskeletal:        General: Normal range of motion.     Right lower leg: No edema.     Left lower leg: No edema.  Neurological:     Mental Status: She is alert and oriented to person, place, and time.  Psychiatric:        Mood and Affect: Mood normal.        Latest Ref Rng & Units 10/09/2022    4:51 PM 04/17/2022    4:07 PM 01/04/2022    4:30 PM  CMP  Glucose 70 - 99 mg/dL 82  93  85   BUN 6 - 24 mg/dL 9  9  9    Creatinine 0.57 - 1.00 mg/dL 6.38  7.56  4.33   Sodium 134 - 144 mmol/L 141  137  139   Potassium 3.5 - 5.2 mmol/L 4.2  3.5  4.2   Chloride 96 - 106 mmol/L 104  103  101   CO2 20 - 29 mmol/L 20  23  23    Calcium 8.7 - 10.2 mg/dL 9.6  8.7  9.8   Total Protein 6.0 - 8.5 g/dL 7.2  7.1  7.1   Total Bilirubin 0.0 - 1.2 mg/dL 0.2  0.6  0.3   Alkaline Phos 44 - 121 IU/L 108  81  106   AST 0 - 40 IU/L 19  29  82   ALT 0 - 32 IU/L 17  30  84     Lipid Panel     Component Value Date/Time   CHOL 170 01/04/2022 1630   TRIG 72 01/04/2022 1630   HDL 49 01/04/2022 1630   CHOLHDL 4.8 11/19/2015 1116   VLDL 19 11/19/2015 1116   LDLCALC 107 (  H) 01/04/2022 1630    CBC    Component Value Date/Time   WBC 9.0 10/09/2022 1651   WBC 8.9 04/17/2022 1607   RBC 4.21 10/09/2022 1651   RBC 4.55 04/17/2022 1607   HGB 13.4 10/09/2022 1651   HCT 39.3 10/09/2022 1651   PLT 337 10/09/2022 1651   MCV 93 10/09/2022 1651   MCH 31.8 10/09/2022 1651   MCH 32.1 04/17/2022 1607   MCHC 34.1 10/09/2022 1651   MCHC 33.6 04/17/2022 1607   RDW 13.1 10/09/2022 1651   LYMPHSABS 3.5 (H) 10/09/2022 1651   MONOABS 0.6 08/02/2021 1615   EOSABS 0.2 10/09/2022 1651   BASOSABS 0.0 10/09/2022 1651    Lab Results  Component Value Date   HGBA1C 5.4 10/09/2022    Assessment & Plan:      Lower Back Pain Severe pain, not relieved by Tylenol or Flexeril. Pain is localized to the lower back without radiation to the legs. -Continue Flexeril as prescribed. -Prescribed Lidoderm patches for  additional pain relief, pending insurance coverage. -Recommend discussing Cymbalta with behavioral health provider as it may help with both pain and depression.  Bipolar depression Patient reports uncontrolled depression despite taking Lexapro. Recently restarted medication after a brief period without due to running out. -Continue Lexapro as prescribed. -Recommend discussing efficacy of current treatment and potential switch to Cymbalta with behavioral health provider at next appointment. -She is also on Klonopin from her psychiatrist  Hypertension -Controlled -Continue antihypertensive -Counseled on blood pressure goal of less than 130/80, low-sodium, DASH diet, medication compliance, 150 minutes of moderate intensity exercise per week. Discussed medication compliance, adverse effects.   Chronic headaches -Controlled on Topamax   General Health Maintenance -Administer flu and pneumonia vaccines today. -Order fasting lipid panel as it has not been checked since 2023. -Schedule follow-up visit in 6 months.          Meds ordered this encounter  Medications   hydrochlorothiazide (HYDRODIURIL) 25 MG tablet    Sig: TAKE 1 TABLET(25 MG) BY MOUTH DAILY    Dispense:  90 tablet    Refill:  1   lisinopril (ZESTRIL) 5 MG tablet    Sig: TAKE 1 TABLET(5 MG) BY MOUTH DAILY    Dispense:  90 tablet    Refill:  1   omeprazole (PRILOSEC) 20 MG capsule    Sig: TAKE 1 CAPSULE(20 MG) BY MOUTH DAILY    Dispense:  90 capsule    Refill:  1   topiramate (TOPAMAX) 50 MG tablet    Sig: Take 1 tablet (50 mg total) by mouth 2 (two) times daily.    Dispense:  180 tablet    Refill:  1   lidocaine (LIDODERM) 5 %    Sig: Place 1 patch onto the skin daily. Remove & Discard patch within 12 hours or as directed by MD    Dispense:  30 patch    Refill:  1    Follow-up: Return in about 6 months (around 10/09/2023) for Chronic medical conditions.       Hoy Register, MD, FAAFP. [facility] [place], [place] [phone]   04/11/2023, 2:49 PM

## 2023-04-11 NOTE — Patient Instructions (Signed)
VISIT SUMMARY:  During today's visit, we discussed your worsening back pain, uncontrolled depression, and preparation for your upcoming bariatric surgery. We also reviewed your general health maintenance and administered necessary vaccines.  YOUR PLAN:  -LOWER BACK PAIN: Lower back pain is discomfort in the lower part of the spine. You should continue taking Flexeril as prescribed. We may consider using Lidoderm patches for additional pain relief, depending on your insurance coverage. Additionally, please discuss the possibility of using Cymbalta with your behavioral health provider, as it may help with both pain and depression.  -DEPRESSION: Depression is a mood disorder characterized by persistent feelings of sadness and loss of interest. Continue taking Lexapro as prescribed. At your next appointment with your behavioral health provider, discuss the effectiveness of your current treatment and the potential switch to Cymbalta.  -BARIATRIC SURGERY PREPARATION: You are preparing for bariatric surgery, which is scheduled for next month. Continue following the plan as directed by your surgical team. Well done on quitting smoking in preparation for the surgery.  -GENERAL HEALTH MAINTENANCE: We administered your flu and pneumonia vaccines today. A fasting lipid panel has been ordered since it has not been checked since 2023. Please schedule a follow-up visit in 6 months.  INSTRUCTIONS:  Please follow up with your behavioral health provider to discuss the potential switch to Cymbalta for both pain and depression management. Additionally, ensure you continue with the current plan as directed by your surgical team for your upcoming bariatric surgery. Schedule a follow-up visit with [place] in 6 months.

## 2023-04-12 ENCOUNTER — Encounter: Payer: Self-pay | Admitting: Family Medicine

## 2023-04-12 LAB — CMP14+EGFR
ALT: 28 [IU]/L (ref 0–32)
AST: 26 [IU]/L (ref 0–40)
Albumin: 4.4 g/dL (ref 3.9–4.9)
Alkaline Phosphatase: 112 [IU]/L (ref 44–121)
BUN/Creatinine Ratio: 13 (ref 9–23)
BUN: 13 mg/dL (ref 6–24)
Bilirubin Total: <0.2 mg/dL (ref 0.0–1.2)
CO2: 24 mmol/L (ref 20–29)
Calcium: 10.1 mg/dL (ref 8.7–10.2)
Chloride: 103 mmol/L (ref 96–106)
Creatinine, Ser: 1.03 mg/dL — ABNORMAL HIGH (ref 0.57–1.00)
Globulin, Total: 3.1 g/dL (ref 1.5–4.5)
Glucose: 94 mg/dL (ref 70–99)
Potassium: 4.2 mmol/L (ref 3.5–5.2)
Sodium: 141 mmol/L (ref 134–144)
Total Protein: 7.5 g/dL (ref 6.0–8.5)
eGFR: 66 mL/min/{1.73_m2} (ref >59)

## 2023-04-12 LAB — LP+NON-HDL CHOLESTEROL
Cholesterol, Total: 233 mg/dL — ABNORMAL HIGH (ref 100–199)
HDL: 59 mg/dL (ref >39)
LDL Chol Calc (NIH): 144 mg/dL — ABNORMAL HIGH (ref 0–99)
Total Non-HDL-Chol (LDL+VLDL): 174 mg/dL — ABNORMAL HIGH (ref 0–129)
Triglycerides: 171 mg/dL — ABNORMAL HIGH (ref 0–149)
VLDL Cholesterol Cal: 30 mg/dL (ref 5–40)

## 2023-04-13 ENCOUNTER — Telehealth: Payer: Self-pay

## 2023-04-13 NOTE — Telephone Encounter (Signed)
Pharmacy Patient Advocate Encounter   Received notification from CoverMyMeds that prior authorization for lidocaine 5% patch is required/requested.   Insurance verification completed.   The patient is insured through [facility] .   Per test claim: PA required; PA submitted to above mentioned insurance via CoverMyMeds Key/confirmation #/EOC [id number] Status is pending

## 2023-04-16 ENCOUNTER — Other Ambulatory Visit: Payer: Self-pay

## 2023-04-20 ENCOUNTER — Telehealth: Payer: Self-pay

## 2023-04-20 NOTE — Telephone Encounter (Signed)
 Patient was called and informed that form has been faxed to [facility].

## 2023-05-09 ENCOUNTER — Other Ambulatory Visit: Payer: Self-pay | Admitting: Family Medicine

## 2023-05-09 DIAGNOSIS — M62838 Other muscle spasm: Secondary | ICD-10-CM

## 2023-07-24 ENCOUNTER — Other Ambulatory Visit: Payer: Self-pay | Admitting: Family Medicine

## 2023-07-24 DIAGNOSIS — M62838 Other muscle spasm: Secondary | ICD-10-CM

## 2023-09-24 IMAGING — MG MM DIGITAL SCREENING BILAT W/ TOMO AND CAD
8 series · 8 of 24 positions shown · non-contrast
Comparison: None.

CLINICAL DATA: Screening.

EXAM:
DIGITAL SCREENING BILATERAL MAMMOGRAM WITH TOMOSYNTHESIS AND CAD
TECHNIQUE: Bilateral screening digital craniocaudal and mediolateral oblique
mammograms were obtained. Bilateral screening digital breast
tomosynthesis was performed. The images were evaluated with
computer-aided detection.

[L MLO synth-2D]
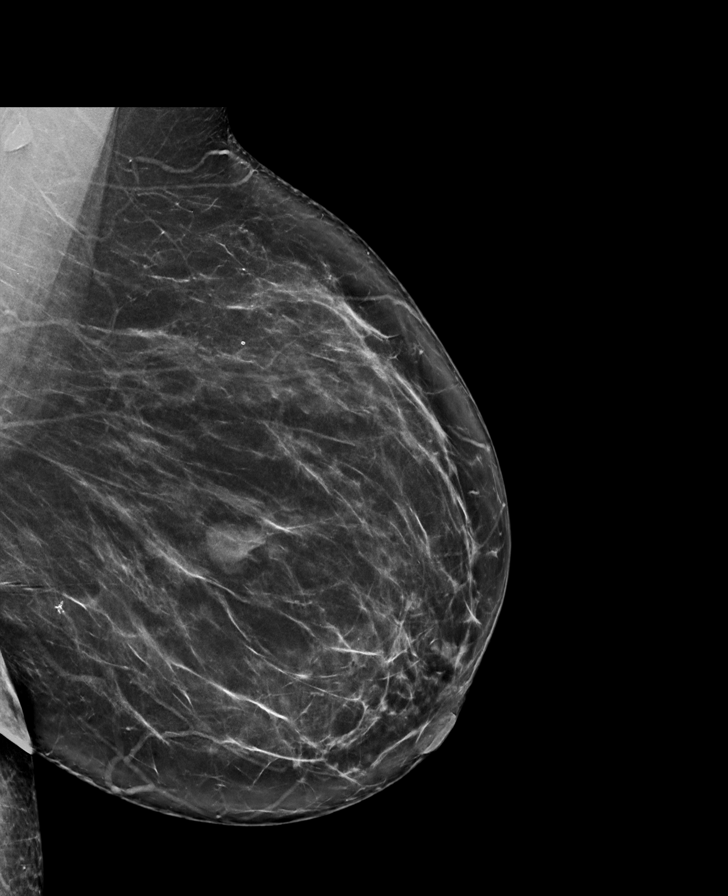

[L CC synth-2D]
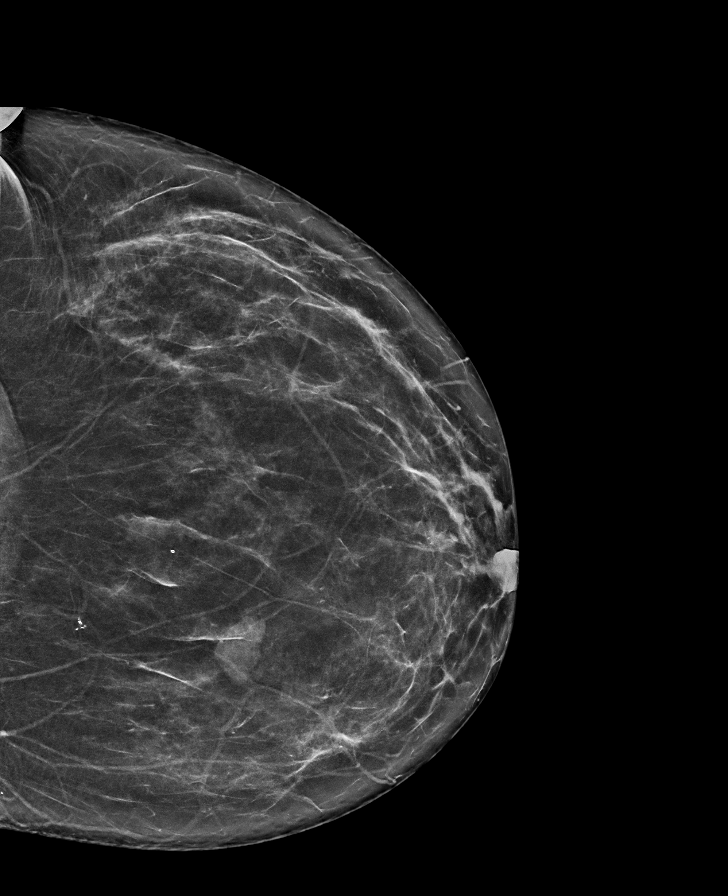

[R CC synth-2D]
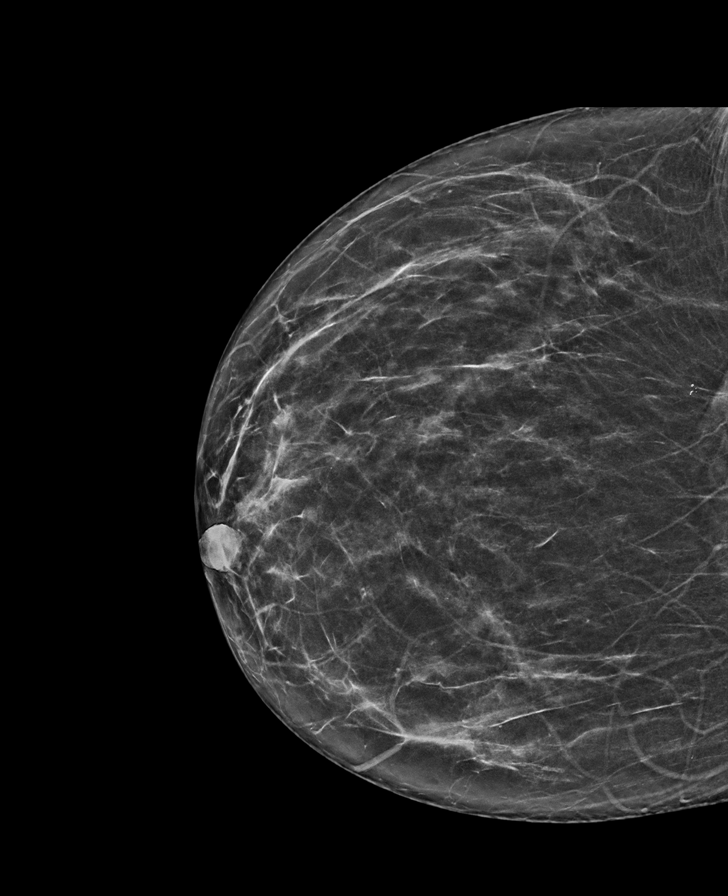

[R MLO synth-2D]
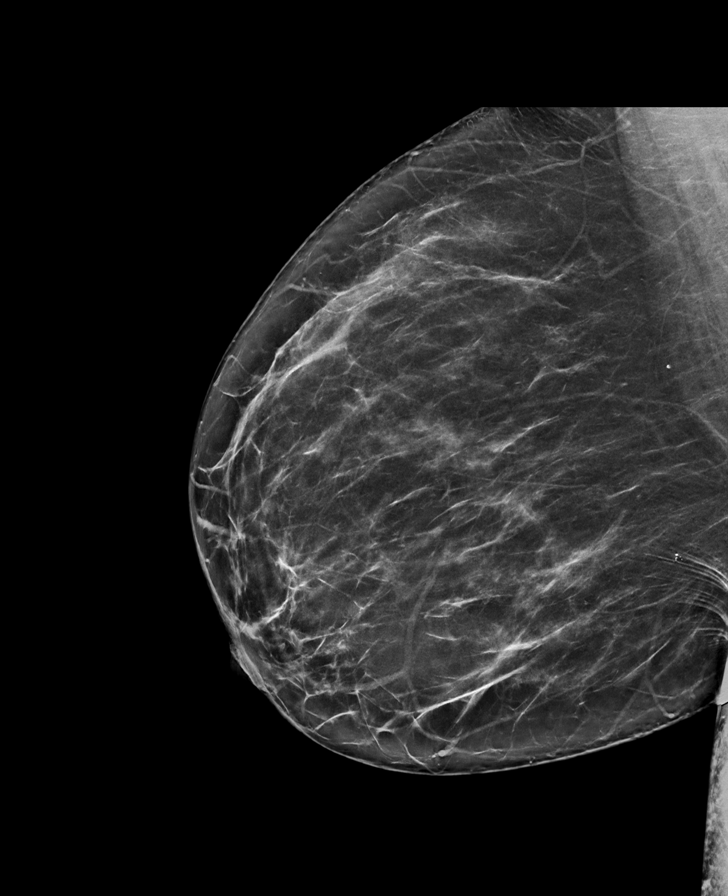

[L CC tomo · tomo slice 36/71.0]
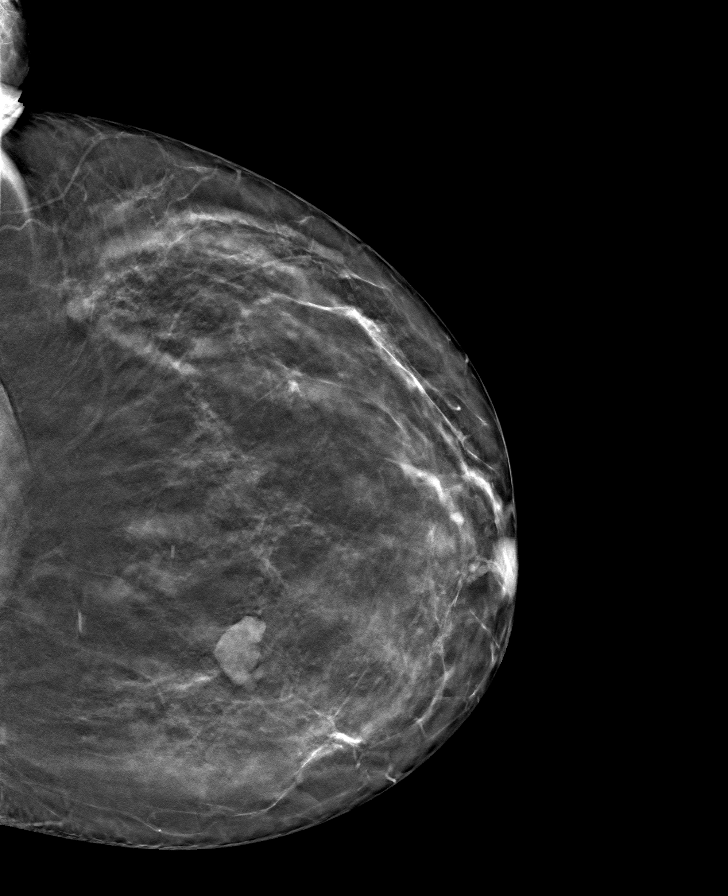

[R MLO tomo · tomo slice 42/83.0]
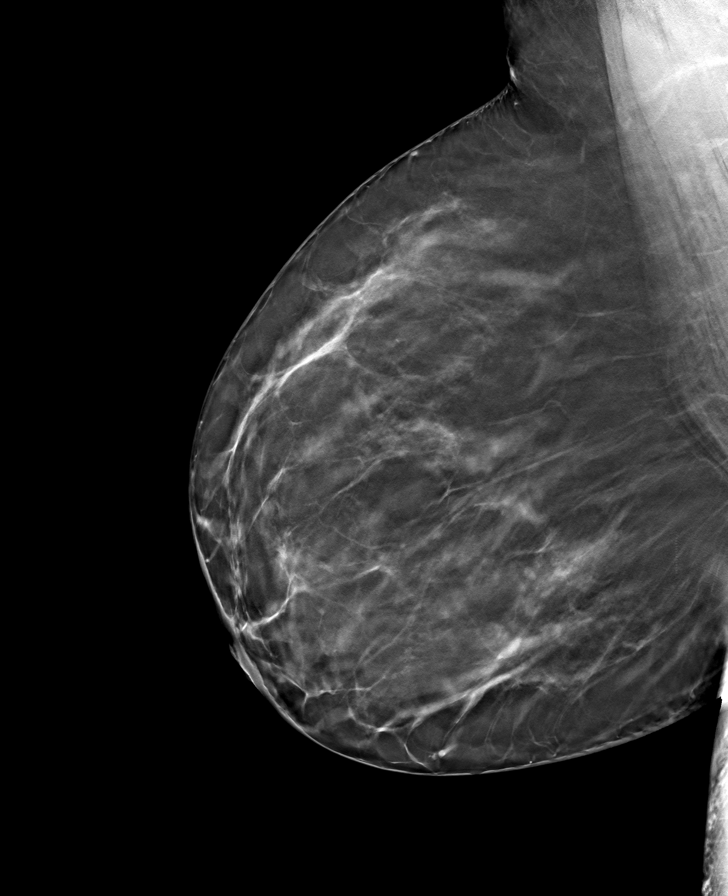

[L MLO tomo · tomo slice 42/83.0]
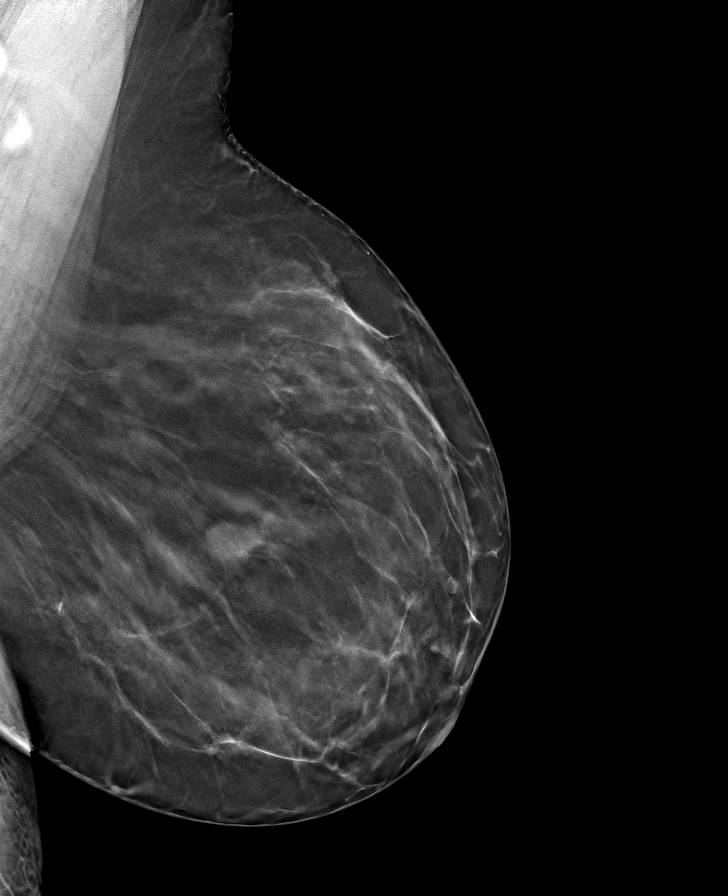

[R CC tomo · tomo slice 35/69.0]
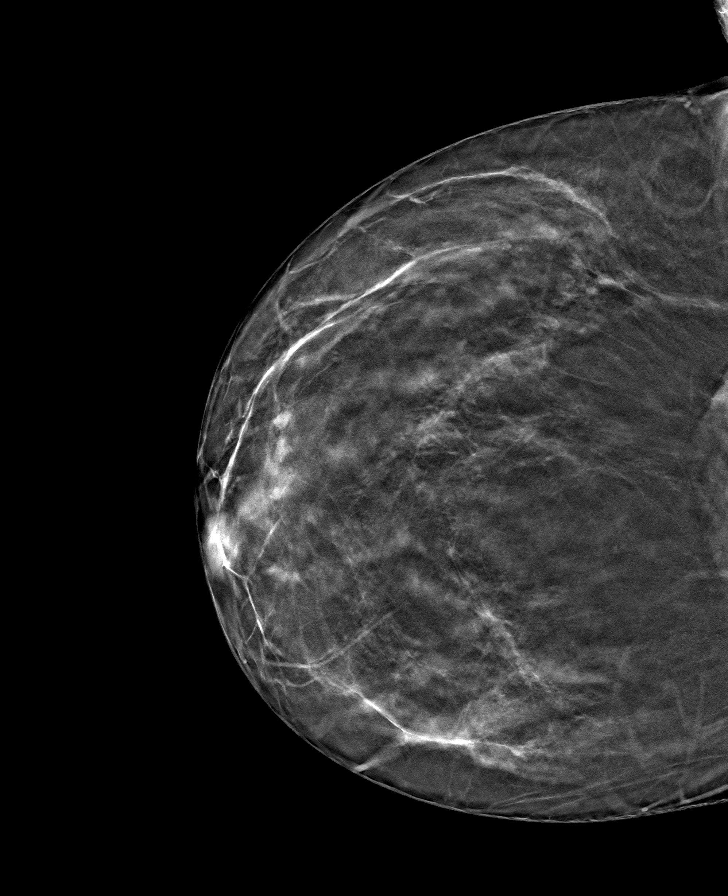

[8 of 24 positions shown; findings below may reference images not displayed]

ACR Breast Density Category b: There are scattered areas of
fibroglandular density.
FINDINGS: In the left breast, a possible mass warrants further evaluation. In
the right breast, no findings suspicious for malignancy.
IMPRESSION: Further evaluation is suggested for possible mass in the left
breast.

RECOMMENDATION:
Ultrasound of the left breast. (Code:[id number])

The patient will be contacted regarding the findings, and additional
imaging will be scheduled.

BI-RADS CATEGORY  0: Incomplete. Need additional imaging evaluation
and/or prior mammograms for comparison.

## 2023-10-04 ENCOUNTER — Encounter (INDEPENDENT_AMBULATORY_CARE_PROVIDER_SITE_OTHER): Payer: Self-pay | Admitting: Nurse Practitioner

## 2023-10-04 ENCOUNTER — Ambulatory Visit (INDEPENDENT_AMBULATORY_CARE_PROVIDER_SITE_OTHER): Payer: MEDICAID | Admitting: Nurse Practitioner

## 2023-10-04 VITALS — BP 110/77 | HR 72 | Temp 98.3°F | Ht 63.0 in | Wt 289.0 lb

## 2023-10-04 DIAGNOSIS — R569 Unspecified convulsions: Secondary | ICD-10-CM | POA: Diagnosis not present

## 2023-10-04 DIAGNOSIS — K760 Fatty (change of) liver, not elsewhere classified: Secondary | ICD-10-CM

## 2023-10-04 DIAGNOSIS — I10 Essential (primary) hypertension: Secondary | ICD-10-CM | POA: Diagnosis not present

## 2023-10-04 DIAGNOSIS — F319 Bipolar disorder, unspecified: Secondary | ICD-10-CM | POA: Diagnosis not present

## 2023-10-04 DIAGNOSIS — K219 Gastro-esophageal reflux disease without esophagitis: Secondary | ICD-10-CM

## 2023-10-04 DIAGNOSIS — Z6841 Body Mass Index (BMI) 40.0 and over, adult: Secondary | ICD-10-CM

## 2023-10-04 DIAGNOSIS — E66813 Obesity, class 3: Secondary | ICD-10-CM

## 2023-10-04 NOTE — Progress Notes (Signed)
 [address] [place], [place], [place] [address] Office: [phone]  /  Fax: [phone]   Initial Consultation    Kathryn Larson was seen in clinic today to evaluate for obesity. She is interested in losing weight to improve overall health and reduce the risk of weight related complications. She presents today to review program treatment options, initial physical assessment, and evaluation.    [name] has been evaluated for bariatric surgery - sleeve gastrectomy with Dr. Dela at [facility]. Has decided against having surgery.   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 Last liver functions 04/11/23 were in normal range.  She does have a history of seizures and is currently on Depakote 250 mg QD. Last seizure reported as 5 years ago  She does follow with behavioral health for bipolar disorder and is currently on Lexapro 20 mg every day , Geodon 80 mg BID and Klonopin 0.5 mg BID PRN.  She has a history of hypertension followed by PCP Dr. Newlin. BP is currently controlled with Lisinopril  5 mg every day  History of GERD currently improved with omeprazole  20 mg daily.  She does have a history of chronic headaches which are managed with Topiramate  50 mg BID.   Anthropometrics and Bioimpedance Analysis   Body mass index is 51.19 kg/m. Body Fat Mass : 52.6 % Visceral Fat Mass Rating : 20   Obesity Related Diseases and Complications  Obesity Quality of Life and Psychosocial Complications: Depression and / or anxiety, Reduced health-related quality of life, and Decrease physical activity and social participation  Cardiometabolic: Hypertension, MASLD or MASH, and Unspecified edema  Biomechanical: Low back pain and compression fractures in ankles 2011 and continues to have pain in ankles which limits activity   Weight Related History  She was referred by: Friend or Family  When asked what they would like  to accomplish? She states: Adopt a healthier eating pattern and lifestyle, Improve energy levels and physical activity, Improve existing medical conditions, Improve quality of life, and Lose 50 lbs in about 6 month  Weight history: First noticed weight gain after daughter who is 72 years old and has had progressive weight gain.  Highest weight: 289 pounds  Contributing factors: family history of obesity, disruption of circadian rhythm / sleep disordered breathing, consumption of processed foods, use of obesogenic medications: Psychotropic medications, Antiepileptics, and Anticholinergics, moderate to high levels of stress, reduced physical activity, chronic skipping of meals, strong orexigenic signaling and/or inadequate inhibitory control , and need for convenient foods  Prior weight loss attempts: Weight Watchers, [employer], and Atkins  Current or previous pharmacotherapy: Other: over the counter hydroxycut  Response to medication: None  Current nutrition plan: Portion control / smart choices  Greatest challenge with dieting: no weight loss, dieting fatigue, and difficulty maintaining reduced calorie state.  Current level of physical activity: None  Barriers to Exercise: energy and chronic pain  Readiness and Motivation  On a scale from 0 to 10 How ready are you to make changes to your eating and physical activity to lose weight? 9 How important is it for you to lose weight right now ? 10 How confident are you that you can lose weight if you try? 7  Past Medical History   Past Medical History:  Diagnosis Date   Anxiety    Bipolar affective (HCC)    Bipolar disorder, unspecified (HCC) 03/26/2013   Calculus of gallbladder with acute cholecystitis, without mention of obstruction 03/26/2013  Carpal tunnel syndrome    Cataract    GERD (gastroesophageal reflux disease) 02/28/1992   GERD (gastroesophageal reflux disease) 03/26/2013   Hyperlipidemia    Hypertension    Obesity     Obesity, Class III, BMI 40-49.9 (morbid obesity) 03/26/2013   Ovarian cyst    Panic attacks    Rectal bleed    Hemorrhoids   Seizures (HCC)    last seizure in May 2015     Objective    BP 110/77   Pulse 72   Temp 98.3 F (36.8 C)   Ht 5' 3 (1.6 m)   Wt 289 lb (131.1 kg)   SpO2 98%   BMI 51.19 kg/m  She was weighed on the bioimpedance scale: Body mass index is 51.19 kg/m.    General:  Alert, oriented and cooperative. Patient is in no acute distress.  Respiratory: Normal respiratory effort, no problems with respiration noted   Gait: able to ambulate independently  Mental Status: Normal mood and affect. Normal behavior. Normal judgment and thought content.   Diagnostic Data Reviewed  BMET    Component Value Date/Time   NA 141 04/11/2023 1446   K 4.2 04/11/2023 1446   CL 103 04/11/2023 1446   CO2 24 04/11/2023 1446   GLUCOSE 94 04/11/2023 1446   GLUCOSE 93 04/17/2022 1607   BUN 13 04/11/2023 1446   CREATININE 1.03 (H) 04/11/2023 1446   CREATININE 0.89 11/19/2015 1116   CALCIUM 10.1 04/11/2023 1446   GFRNONAA >60 04/17/2022 1607   GFRNONAA 80 11/19/2015 1116   GFRAA 70 04/14/2020 1509   GFRAA >89 11/19/2015 1116   Lab Results  Component Value Date   HGBA1C 5.4 10/09/2022   HGBA1C 5.5 11/24/2020   No results found for: INSULIN CBC    Component Value Date/Time   WBC 9.0 10/09/2022 1651   WBC 8.9 04/17/2022 1607   RBC 4.21 10/09/2022 1651   RBC 4.55 04/17/2022 1607   HGB 13.4 10/09/2022 1651   HCT 39.3 10/09/2022 1651   PLT 337 10/09/2022 1651   MCV 93 10/09/2022 1651   MCH 31.8 10/09/2022 1651   MCH 32.1 04/17/2022 1607   MCHC 34.1 10/09/2022 1651   MCHC 33.6 04/17/2022 1607   RDW 13.1 10/09/2022 1651   Iron/TIBC/Ferritin/ %Sat No results found for: IRON, TIBC, FERRITIN, IRONPCTSAT Lipid Panel     Component Value Date/Time   CHOL 233 (H) 04/11/2023 1446   TRIG 171 (H) 04/11/2023 1446   HDL 59 04/11/2023 1446   CHOLHDL 4.8  11/19/2015 1116   VLDL 19 11/19/2015 1116   LDLCALC 144 (H) 04/11/2023 1446   Hepatic Function Panel     Component Value Date/Time   PROT 7.5 04/11/2023 1446   ALBUMIN 4.4 04/11/2023 1446   AST 26 04/11/2023 1446   ALT 28 04/11/2023 1446   ALKPHOS 112 04/11/2023 1446   BILITOT <0.2 04/11/2023 1446   BILIDIR <0.2 02/11/2014 1707   IBILI NOT CALCULATED 02/11/2014 1707      Component Value Date/Time   TSH 0.597 11/24/2020 1504    Medications  Outpatient Encounter Medications as of 10/04/2023  Medication Sig   cetirizine  (ZYRTEC ) 10 MG tablet Take 1 tablet (10 mg total) by mouth daily.   clonazePAM (KLONOPIN) 0.5 MG tablet Take 0.5 mg by mouth 2 (two) times daily as needed.   cyclobenzaprine  (FLEXERIL ) 10 MG tablet TAKE 1 TABLET(10 MG) BY MOUTH TWICE DAILY AS NEEDED FOR MUSCLE SPASMS   divalproex (DEPAKOTE) 250 MG DR tablet Take  by mouth daily.   escitalopram (LEXAPRO) 10 MG tablet Take 10 mg by mouth daily.   famotidine  (PEPCID ) 20 MG tablet Take 1 tablet (20 mg total) by mouth 2 (two) times daily.   hydrochlorothiazide  (HYDRODIURIL ) 25 MG tablet TAKE 1 TABLET(25 MG) BY MOUTH DAILY   hydrOXYzine  (ATARAX ) 50 MG tablet Take by mouth 2 (two) times daily.   ibuprofen  (ADVIL ) 800 MG tablet Take 1 tablet (800 mg total) by mouth every 12 (twelve) hours as needed.   lisinopril  (ZESTRIL ) 5 MG tablet Take 5 mg by mouth.   omeprazole  (PRILOSEC) 20 MG capsule Take 20 mg by mouth.   topiramate  (TOPAMAX ) 50 MG tablet Take 50 mg by mouth.   ziprasidone (GEODON) 80 MG capsule Take 80 mg by mouth.   lidocaine  (LIDODERM ) 5 % Place 1 patch onto the skin daily. Remove & Discard patch within 12 hours or as directed by MD (Patient not taking: Reported on 10/04/2023)   phenylephrine  (,USE FOR PREPARATION-H,) 0.25 % suppository Place 1 suppository rectally as needed for hemorrhoids. (Patient not taking: Reported on 10/04/2023)   prazosin (MINIPRESS) 5 MG capsule Take 5 mg by mouth daily. (Patient not taking:  Reported on 10/04/2023)   [DISCONTINUED] divalproex (DEPAKOTE ER) 500 MG 24 hr tablet Take 500 mg by mouth 2 (two) times daily.   [DISCONTINUED] escitalopram (LEXAPRO) 20 MG tablet Take 20 mg by mouth daily.   [DISCONTINUED] lisinopril  (ZESTRIL ) 5 MG tablet TAKE 1 TABLET(5 MG) BY MOUTH DAILY   [DISCONTINUED] omeprazole  (PRILOSEC) 20 MG capsule TAKE 1 CAPSULE(20 MG) BY MOUTH DAILY   [DISCONTINUED] topiramate  (TOPAMAX ) 50 MG tablet Take 1 tablet (50 mg total) by mouth 2 (two) times daily.   [DISCONTINUED] ziprasidone (GEODON) 80 MG capsule Take 80 mg by mouth 2 (two) times daily.   No facility-administered encounter medications on file as of 10/04/2023.     Assessment and Plan   Essential hypertension  Metabolic dysfunction-associated steatotic liver disease (MASLD)  Bipolar depression (HCC)  Seizures (HCC)  Gastroesophageal reflux disease, unspecified whether esophagitis present  Class 3 severe obesity with serious comorbidity and body mass index (BMI) of 50.0 to 59.9 in adult       Obesity Treatment and Action Plan:  Patient will work on garnering support from family and friends to begin weight loss journey. Will work on eliminating or reducing the presence of highly palatable, calorie dense foods in the home. Will complete provided nutritional and psychosocial assessment questionnaire before the next appointment. Will be scheduled for indirect calorimetry to determine resting energy expenditure in a fasting state.  This will allow us  to create a reduced calorie, high-protein meal plan to promote loss of fat mass while preserving muscle mass. Counseled on the health benefits of losing 5%-15% of total body weight. Was counseled on nutritional approaches to weight loss and benefits of reducing processed foods and consuming plant-based foods and high quality protein as part of nutritional weight management. Was counseled on pharmacotherapy and role as an adjunct in weight management.    Education and Additional resources  She was weighed on the bioimpedance scale and results were discussed and documented in the synopsis.  We discussed obesity as a progressive, chronic disease and the importance of a more detailed evaluation of all the factors contributing to the disease.  We reviewed the basic principles in obesity management.   We discussed the importance of long term lifestyle changes which include nutrition, exercise and behavioral modification as well as the importance of customizing this  to her specific health and social needs.  We reviewed the role of medical interventions including pharmacotherapy and surgical interventions.   We discussed the benefits of reaching a healthier weight to alleviate the symptoms of existing conditions and reduce the risks of the biomechanical, cardiometabolic and psychological effects of obesity.  We reviewed our program approach and philosophy, which are guided by the four pillars of obesity medicine.  We discussed how to prepare for intake appointment and the importance of fasting and avoidance of stimulants for at least 8 hours prior to indirect calorimetry.  Arcenia Scarbro appears to be in the action stage of change and reports being ready to initiate intensive lifestyle and behavioral modifications as part of their weight loss journey.  Attestation  Reviewed by clinician on day of visit: allergies, medications, problem list, medical history, surgical history, family history, social history, and previous encounter notes pertinent to obesity diagnosis.  I have spent 41 minutes in the care of the patient today including: 20 minutes before the visit reviewing and preparing the chart. 19 minutes face-to-face assessing and reviewing listed medical problems as outlined in obesity care plan, providing nutritional and behavioral counseling on topics outlined in the obesity care plan, independently interpreting test results and goals of  care, as described in assessment and plan, reviewing and discussing biometric information and progress, and reviewing latest PCP notes and specialist consultations 2 minutes after the visit updating chart and documentation of encounter.  Suleika Donavan ANP-C

## 2023-10-09 ENCOUNTER — Telehealth: Payer: Self-pay | Admitting: Family Medicine

## 2023-10-09 NOTE — Telephone Encounter (Signed)
 Confirmed appt for 8/13

## 2023-10-10 ENCOUNTER — Encounter: Payer: Self-pay | Admitting: Family Medicine

## 2023-10-10 ENCOUNTER — Ambulatory Visit: Payer: MEDICAID | Attending: Family Medicine | Admitting: Family Medicine

## 2023-10-10 ENCOUNTER — Other Ambulatory Visit: Payer: Self-pay | Admitting: Family Medicine

## 2023-10-10 VITALS — BP 120/81 | HR 89 | Ht 63.0 in | Wt 291.0 lb

## 2023-10-10 DIAGNOSIS — G8929 Other chronic pain: Secondary | ICD-10-CM

## 2023-10-10 DIAGNOSIS — I10 Essential (primary) hypertension: Secondary | ICD-10-CM | POA: Diagnosis not present

## 2023-10-10 DIAGNOSIS — R600 Localized edema: Secondary | ICD-10-CM

## 2023-10-10 DIAGNOSIS — F3132 Bipolar disorder, current episode depressed, moderate: Secondary | ICD-10-CM | POA: Diagnosis not present

## 2023-10-10 DIAGNOSIS — M545 Low back pain, unspecified: Secondary | ICD-10-CM

## 2023-10-10 DIAGNOSIS — G43809 Other migraine, not intractable, without status migrainosus: Secondary | ICD-10-CM

## 2023-10-10 DIAGNOSIS — E66813 Obesity, class 3: Secondary | ICD-10-CM

## 2023-10-10 MED ORDER — TOPIRAMATE 50 MG PO TABS: 50.0000 mg | ORAL_TABLET | Freq: Two times a day (BID) | ORAL | 1 refills | Status: AC

## 2023-10-10 MED ORDER — HYDROCHLOROTHIAZIDE 25 MG PO TABS
ORAL_TABLET | ORAL | 1 refills | Status: AC
Start: 2023-10-10 — End: ?

## 2023-10-10 MED ORDER — MELOXICAM 7.5 MG PO TABS
7.5000 mg | ORAL_TABLET | Freq: Every day | ORAL | 1 refills | Status: AC
Start: 2023-10-10 — End: ?

## 2023-10-10 MED ORDER — CYCLOBENZAPRINE HCL 10 MG PO TABS
10.0000 mg | ORAL_TABLET | Freq: Three times a day (TID) | ORAL | 1 refills | Status: AC | PRN
Start: 2023-10-10 — End: ?

## 2023-10-10 NOTE — Progress Notes (Signed)
 Subjective:  Patient ID: Kathryn Larson, female    DOB: Nov 16, 1970  Age: 53 y.o. MRN: [id number]  CC: Medical Management of Chronic Issues (Lower back pain/Swelling in ankles/Headaches)     Discussed the use of AI scribe software for clinical note transcription with the patient, who gave verbal consent to proceed.  History of Present Illness Kathryn Larson is a 53 year old female with a history of hypertension, bipolar disorder (managed by [facility]), GERD, Migraine who presents with worsening back pain and headaches.  She has experienced worsening lower back pain for the past two weeks, described as aching and severe enough to keep her bedridden. She has tried Tylenol  and Aleve  without relief and is currently taking Flexeril . Recurrent headaches have also worsened, coinciding with the discontinuation of Topamax , which she previously took for migraine management. She was taking 50 mg of Topamax  twice daily.  Swelling in her ankles occurs particularly when standing or walking. She denies high sodium intake and is taking hydrochlorothiazide  for blood pressure management. She is under Psychiatry care for management of her Bipolar disorder.    Past Medical History:  Diagnosis Date   Anxiety    Bipolar affective (HCC)    Bipolar disorder, unspecified (HCC) 03/26/2013   Calculus of gallbladder with acute cholecystitis, without mention of obstruction 03/26/2013   Carpal tunnel syndrome    Cataract    GERD (gastroesophageal reflux disease) 02/28/1992   GERD (gastroesophageal reflux disease) 03/26/2013   Hyperlipidemia    Hypertension    Obesity    Obesity, Class III, BMI 40-49.9 (morbid obesity) 03/26/2013   Ovarian cyst    Panic attacks    Rectal bleed    Hemorrhoids   Seizures (HCC)    last seizure in May 2015    Past Surgical History:  Procedure Laterality Date   ANKLE FRACTURE SURGERY     Bilateral - right 2013, left 2010   BREAST BIOPSY Left  09/07/2021   CHOLECYSTECTOMY N/A 03/24/2013   Procedure: LAPAROSCOPIC CHOLECYSTECTOMY;  Surgeon: Lynwood MALVA Pina, MD;  Location: [facility] OR;  Service: General;  Laterality: N/A;   ECTOPIC PREGNANCY SURGERY      Family History  Problem Relation Age of Onset   Diabetes Mother    Diabetes Sister    Rectal cancer Maternal Uncle    Stomach cancer Maternal Uncle    Cancer Maternal Grandmother    Colon cancer Neg Hx    Colon polyps Neg Hx    Esophageal cancer Neg Hx    BRCA 1/2 Neg Hx    Breast cancer Neg Hx     Social History   Socioeconomic History   Marital status: Legally Separated    Spouse name: Not on file   Number of children: Not on file   Years of education: Not on file   Highest education level: 10th grade  Occupational History   Occupation: disabled    Comment: mental health and ankles  Tobacco Use   Smoking status: Former    Current packs/day: 0.00    Types: Cigarettes    Quit date: 02/2023    Years since quitting: 0.6   Smokeless tobacco: Former  Building services engineer status: Never Used  Substance and Sexual Activity   Alcohol use: Yes    Alcohol/week: 0.0 standard drinks of alcohol    Comment: occ.   Drug use: No   Sexual activity: Yes    Partners: Male    Birth control/protection: None  Other Topics  Concern   Not on file  Social History Narrative   Not on file   Social Drivers of Health   Financial Resource Strain: Medium Risk (10/09/2023)   Overall Financial Resource Strain (CARDIA)    Difficulty of Paying Living Expenses: Somewhat hard  Food Insecurity: Food Insecurity Present (10/09/2023)   Hunger Vital Sign    Worried About Running Out of Food in the Last Year: Sometimes true    Ran Out of Food in the Last Year: Sometimes true  Transportation Needs: No Transportation Needs (10/09/2023)   PRAPARE - Administrator, Civil Service (Medical): No    Lack of Transportation (Non-Medical): No  Physical Activity: Insufficiently Active (10/09/2023)    Exercise Vital Sign    Days of Exercise per Week: 3 days    Minutes of Exercise per Session: 30 min  Stress: Stress Concern Present (10/09/2023)   [employer] of Occupational Health - Occupational Stress Questionnaire    Feeling of Stress: Very much  Social Connections: Moderately Isolated (10/09/2023)   Social Connection and Isolation Panel    Frequency of Communication with Friends and Family: Twice a week    Frequency of Social Gatherings with Friends and Family: Twice a week    Attends Religious Services: 1 to 4 times per year    Active Member of [employer] or Organizations: No    Attends Engineer, structural: Not on file    Marital Status: Never married    No Known Allergies  Outpatient Medications Prior to Visit  Medication Sig Dispense Refill   cetirizine  (ZYRTEC ) 10 MG tablet Take 1 tablet (10 mg total) by mouth daily. 30 tablet 1   clonazePAM (KLONOPIN) 0.5 MG tablet Take 0.5 mg by mouth 2 (two) times daily as needed.     divalproex (DEPAKOTE) 250 MG DR tablet Take by mouth daily.     escitalopram (LEXAPRO) 10 MG tablet Take 10 mg by mouth daily.     famotidine  (PEPCID ) 20 MG tablet Take 1 tablet (20 mg total) by mouth 2 (two) times daily. 30 tablet 0   hydrOXYzine  (ATARAX ) 50 MG tablet Take by mouth 2 (two) times daily.     lisinopril  (ZESTRIL ) 5 MG tablet Take 5 mg by mouth.     omeprazole  (PRILOSEC) 20 MG capsule Take 20 mg by mouth.     prazosin (MINIPRESS) 5 MG capsule Take 5 mg by mouth daily.  0   ziprasidone (GEODON) 80 MG capsule Take 80 mg by mouth.     cyclobenzaprine  (FLEXERIL ) 10 MG tablet TAKE 1 TABLET(10 MG) BY MOUTH TWICE DAILY AS NEEDED FOR MUSCLE SPASMS 60 tablet 1   hydrochlorothiazide  (HYDRODIURIL ) 25 MG tablet TAKE 1 TABLET(25 MG) BY MOUTH DAILY 90 tablet 1   topiramate  (TOPAMAX ) 50 MG tablet Take 50 mg by mouth.     ibuprofen  (ADVIL ) 800 MG tablet Take 1 tablet (800 mg total) by mouth every 12 (twelve) hours as needed. (Patient not taking:  Reported on 10/10/2023) 60 tablet 5   lidocaine  (LIDODERM ) 5 % Place 1 patch onto the skin daily. Remove & Discard patch within 12 hours or as directed by MD (Patient not taking: Reported on 10/10/2023) 30 patch 1   phenylephrine  (,USE FOR PREPARATION-H,) 0.25 % suppository Place 1 suppository rectally as needed for hemorrhoids. (Patient not taking: Reported on 10/10/2023) 12 suppository 0   No facility-administered medications prior to visit.     ROS Review of Systems  Constitutional:  Negative for activity change and  appetite change.  HENT:  Negative for sinus pressure and sore throat.   Respiratory:  Negative for chest tightness, shortness of breath and wheezing.   Cardiovascular:  Positive for leg swelling. Negative for chest pain and palpitations.  Gastrointestinal:  Negative for abdominal distention, abdominal pain and constipation.  Genitourinary: Negative.   Musculoskeletal:        See HPI  Neurological:  Positive for headaches.  Psychiatric/Behavioral:  Negative for behavioral problems and dysphoric mood.     Objective:  BP 120/81   Pulse 89   Ht 5' 3 (1.6 m)   Wt 291 lb (132 kg)   SpO2 99%   BMI 51.55 kg/m      10/10/2023    2:23 PM 10/04/2023   12:00 PM 04/11/2023    2:15 PM  BP/Weight  Systolic BP 120 110 114  Diastolic BP 81 77 79  Wt. (Lbs) 291 289 291.4  BMI 51.55 kg/m2 51.19 kg/m2 50.02 kg/m2      Physical Exam Constitutional:      Appearance: She is well-developed.  Cardiovascular:     Rate and Rhythm: Normal rate.     Heart sounds: Normal heart sounds. No murmur heard. Pulmonary:     Effort: Pulmonary effort is normal.     Breath sounds: Normal breath sounds. No wheezing or rales.  Chest:     Chest wall: No tenderness.  Abdominal:     General: Bowel sounds are normal. There is no distension.     Palpations: Abdomen is soft. There is no mass.     Tenderness: There is no abdominal tenderness.  Musculoskeletal:     Right lower leg: No edema.      Left lower leg: No edema.     Comments: TTP of lumbar spine  Neurological:     Mental Status: She is alert and oriented to person, place, and time.  Psychiatric:        Mood and Affect: Mood normal.        Latest Ref Rng & Units 04/11/2023    2:46 PM 10/09/2022    4:51 PM 04/17/2022    4:07 PM  CMP  Glucose 70 - 99 mg/dL 94  82  93   BUN 6 - 24 mg/dL 13  9  9    Creatinine 0.57 - 1.00 mg/dL 8.96  9.12  8.97   Sodium 134 - 144 mmol/L 141  141  137   Potassium 3.5 - 5.2 mmol/L 4.2  4.2  3.5   Chloride 96 - 106 mmol/L 103  104  103   CO2 20 - 29 mmol/L 24  20  23    Calcium 8.7 - 10.2 mg/dL 89.8  9.6  8.7   Total Protein 6.0 - 8.5 g/dL 7.5  7.2  7.1   Total Bilirubin 0.0 - 1.2 mg/dL <9.7  0.2  0.6   Alkaline Phos 44 - 121 IU/L 112  108  81   AST 0 - 40 IU/L 26  19  29    ALT 0 - 32 IU/L 28  17  30      Lipid Panel     Component Value Date/Time   CHOL 233 (H) 04/11/2023 1446   TRIG 171 (H) 04/11/2023 1446   HDL 59 04/11/2023 1446   CHOLHDL 4.8 11/19/2015 1116   VLDL 19 11/19/2015 1116   LDLCALC 144 (H) 04/11/2023 1446    CBC    Component Value Date/Time   WBC 9.0 10/09/2022 1651   WBC 8.9  04/17/2022 1607   RBC 4.21 10/09/2022 1651   RBC 4.55 04/17/2022 1607   HGB 13.4 10/09/2022 1651   HCT 39.3 10/09/2022 1651   PLT 337 10/09/2022 1651   MCV 93 10/09/2022 1651   MCH 31.8 10/09/2022 1651   MCH 32.1 04/17/2022 1607   MCHC 34.1 10/09/2022 1651   MCHC 33.6 04/17/2022 1607   RDW 13.1 10/09/2022 1651   LYMPHSABS 3.5 (H) 10/09/2022 1651   MONOABS 0.6 08/02/2021 1615   EOSABS 0.2 10/09/2022 1651   BASOSABS 0.0 10/09/2022 1651    Lab Results  Component Value Date   HGBA1C 5.4 10/09/2022   The 10-year ASCVD risk score (Arnett DK, et al., 2019) is: 3%   Values used to calculate the score:     Age: 7 years     Clincally relevant sex: Female     Is Non-Hispanic African American: Yes     Diabetic: No     Tobacco smoker: No     Systolic Blood Pressure: 120 mmHg      Is BP treated: Yes     HDL Cholesterol: 59 mg/dL     Total Cholesterol: 233 mg/dL      Assessment & Plan Chronic low back pain with lower extremity stiffness and numbness Chronic low back pain with stiffness and numbness in lower extremities, possibly due to arthritis. Current pain management with Flexeril  is inadequate. Cymbalta not prescribed due to liver concerns. - Refer to orthopedic specialist for further evaluation and management. - Prescribe meloxicam  for pain management in addition to Flexeril . - Discuss potential use of Cymbalta with psychiatrist  Chronic migraine headaches Chronic migraines exacerbated by discontinuation of Topamax . Headaches are severe and persistent. - Restart Topamax  50 mg twice daily. - Ensure prescription is sent to [facility] on [address] with sufficient refills for six months.  Dependent lower extremity edema Dependent edema in lower extremities, worsens with standing and walking. No signs of congestive heart failure. Additional diuretics not recommended due to risk of hypotension. - Recommend use of compression stockings.  Essential hypertension Hypertension managed with hydrochlorothiazide . Blood pressure is low, indicating effective management. - Continue current antihypertensive regimen with hydrochlorothiazide .  Obesity Obesity management complicated by previous cancellation of bariatric surgery due to mental health concerns. Seeking alternative weight management options. - Encourage continued engagement with weight management programs.   Bipolar disorder Stable Continue medications  Management as per Psych     Meds ordered this encounter  Medications   meloxicam  (MOBIC ) 7.5 MG tablet    Sig: Take 1 tablet (7.5 mg total) by mouth daily.    Dispense:  90 tablet    Refill:  1   cyclobenzaprine  (FLEXERIL ) 10 MG tablet    Sig: Take 1 tablet (10 mg total) by mouth 3 (three) times daily as needed for muscle spasms.    Dispense:  180  tablet    Refill:  1   topiramate  (TOPAMAX ) 50 MG tablet    Sig: Take 1 tablet (50 mg total) by mouth 2 (two) times daily.    Dispense:  180 tablet    Refill:  1   hydrochlorothiazide  (HYDRODIURIL ) 25 MG tablet    Sig: TAKE 1 TABLET(25 MG) BY MOUTH DAILY    Dispense:  90 tablet    Refill:  1    Follow-up: No follow-ups on file.       Corrina Sabin, MD, FAAFP. [facility] [place], [place] [phone]   10/10/2023, 6:16 PM

## 2023-10-11 ENCOUNTER — Ambulatory Visit: Payer: Self-pay | Admitting: Family Medicine

## 2023-10-11 LAB — BASIC METABOLIC PANEL WITH GFR
BUN/Creatinine Ratio: 11 (ref 9–23)
BUN: 10 mg/dL (ref 6–24)
CO2: 22 mmol/L (ref 20–29)
Calcium: 10.1 mg/dL (ref 8.7–10.2)
Chloride: 102 mmol/L (ref 96–106)
Creatinine, Ser: 0.89 mg/dL (ref 0.57–1.00)
Glucose: 85 mg/dL (ref 70–99)
Potassium: 4.7 mmol/L (ref 3.5–5.2)
Sodium: 140 mmol/L (ref 134–144)
eGFR: 78 mL/min/1.73 (ref >59)

## 2023-10-12 ENCOUNTER — Telehealth: Payer: Self-pay | Admitting: Physical Medicine and Rehabilitation

## 2023-10-12 NOTE — Telephone Encounter (Signed)
 Called pt and left vm for pt to set an referral appt with [facility] for back pains. Please attach referral

## 2023-12-28 ENCOUNTER — Other Ambulatory Visit: Payer: Self-pay | Admitting: Family Medicine

## 2023-12-31 ENCOUNTER — Encounter: Payer: Self-pay | Admitting: Radiology

## 2024-04-02 ENCOUNTER — Other Ambulatory Visit: Payer: Self-pay | Admitting: Family Medicine
